# Patient Record
Sex: Male | Born: 1955 | Race: Black or African American | Hispanic: No | Marital: Married | Smoking: Former smoker
Health system: Southern US, Community
[De-identification: ages and names within clinical notes are randomized; demographics above are authoritative.]

## PROBLEM LIST (undated history)

## (undated) DIAGNOSIS — E785 Hyperlipidemia, unspecified: Secondary | ICD-10-CM

## (undated) DIAGNOSIS — C801 Malignant (primary) neoplasm, unspecified: Secondary | ICD-10-CM

## (undated) DIAGNOSIS — M1990 Unspecified osteoarthritis, unspecified site: Secondary | ICD-10-CM

## (undated) DIAGNOSIS — E119 Type 2 diabetes mellitus without complications: Secondary | ICD-10-CM

## (undated) DIAGNOSIS — Z8546 Personal history of malignant neoplasm of prostate: Secondary | ICD-10-CM

## (undated) DIAGNOSIS — R972 Elevated prostate specific antigen [PSA]: Secondary | ICD-10-CM

## (undated) DIAGNOSIS — I10 Essential (primary) hypertension: Secondary | ICD-10-CM

## (undated) HISTORY — PX: JOINT REPLACEMENT: SHX530

## (undated) HISTORY — DX: Personal history of malignant neoplasm of prostate: Z85.46

## (undated) HISTORY — DX: Hyperlipidemia, unspecified: E78.5

## (undated) HISTORY — DX: Elevated prostate specific antigen (PSA): R97.20

---

## 2007-08-16 DIAGNOSIS — C801 Malignant (primary) neoplasm, unspecified: Secondary | ICD-10-CM

## 2007-08-16 HISTORY — DX: Malignant (primary) neoplasm, unspecified: C80.1

## 2008-08-15 HISTORY — PX: HERNIA REPAIR: SHX51

## 2013-08-15 HISTORY — PX: OTHER SURGICAL HISTORY: SHX169

## 2013-12-06 DIAGNOSIS — N529 Male erectile dysfunction, unspecified: Secondary | ICD-10-CM | POA: Insufficient documentation

## 2013-12-06 DIAGNOSIS — Z206 Contact with and (suspected) exposure to human immunodeficiency virus [HIV]: Secondary | ICD-10-CM

## 2013-12-06 DIAGNOSIS — R221 Localized swelling, mass and lump, neck: Secondary | ICD-10-CM | POA: Insufficient documentation

## 2013-12-06 DIAGNOSIS — I10 Essential (primary) hypertension: Secondary | ICD-10-CM | POA: Insufficient documentation

## 2013-12-06 DIAGNOSIS — R51 Headache: Secondary | ICD-10-CM

## 2013-12-06 DIAGNOSIS — A6000 Herpesviral infection of urogenital system, unspecified: Secondary | ICD-10-CM | POA: Insufficient documentation

## 2013-12-23 DIAGNOSIS — K118 Other diseases of salivary glands: Secondary | ICD-10-CM | POA: Insufficient documentation

## 2014-02-06 DIAGNOSIS — C61 Malignant neoplasm of prostate: Secondary | ICD-10-CM | POA: Insufficient documentation

## 2014-02-06 DIAGNOSIS — E1169 Type 2 diabetes mellitus with other specified complication: Secondary | ICD-10-CM | POA: Insufficient documentation

## 2014-02-06 DIAGNOSIS — E1165 Type 2 diabetes mellitus with hyperglycemia: Secondary | ICD-10-CM | POA: Insufficient documentation

## 2014-02-06 DIAGNOSIS — E119 Type 2 diabetes mellitus without complications: Secondary | ICD-10-CM

## 2014-02-06 DIAGNOSIS — Z8546 Personal history of malignant neoplasm of prostate: Secondary | ICD-10-CM

## 2014-09-19 ENCOUNTER — Ambulatory Visit: Payer: Self-pay | Admitting: Family Medicine

## 2014-09-23 ENCOUNTER — Encounter: Payer: Self-pay | Admitting: Family Medicine

## 2014-10-03 ENCOUNTER — Ambulatory Visit: Payer: Self-pay | Admitting: Family Medicine

## 2014-10-14 ENCOUNTER — Encounter
Admit: 2014-10-14 | Disposition: A | Discharge status: Home / Self Care | Payer: Self-pay | Attending: Family Medicine | Admitting: Family Medicine

## 2014-10-31 ENCOUNTER — Ambulatory Visit: Payer: Self-pay | Admitting: Family Medicine

## 2015-08-15 LAB — TSH: TSH: 1.24 (ref 0.41–5.90)

## 2016-01-19 ENCOUNTER — Other Ambulatory Visit: Payer: Self-pay | Admitting: Specialist

## 2016-01-19 DIAGNOSIS — M25552 Pain in left hip: Secondary | ICD-10-CM

## 2016-01-20 ENCOUNTER — Other Ambulatory Visit: Payer: Self-pay | Admitting: Orthopedic Surgery

## 2016-01-20 DIAGNOSIS — M25552 Pain in left hip: Secondary | ICD-10-CM

## 2016-02-10 ENCOUNTER — Ambulatory Visit: Payer: Self-pay

## 2016-02-23 ENCOUNTER — Ambulatory Visit: Payer: Self-pay

## 2016-04-01 ENCOUNTER — Other Ambulatory Visit: Payer: Self-pay | Admitting: Orthopedic Surgery

## 2016-04-01 DIAGNOSIS — M5416 Radiculopathy, lumbar region: Secondary | ICD-10-CM

## 2016-04-13 ENCOUNTER — Ambulatory Visit: Payer: Self-pay

## 2016-04-19 ENCOUNTER — Inpatient Hospital Stay: Admit: 2016-04-19 | Payer: Self-pay | Admitting: Orthopedic Surgery

## 2016-04-19 SURGERY — ARTHROPLASTY, HIP, TOTAL,POSTERIOR APPROACH
Anesthesia: General | Laterality: Left

## 2016-05-18 DIAGNOSIS — M25559 Pain in unspecified hip: Secondary | ICD-10-CM | POA: Insufficient documentation

## 2016-06-06 ENCOUNTER — Other Ambulatory Visit: Payer: Self-pay | Admitting: Orthopedic Surgery

## 2016-06-08 ENCOUNTER — Ambulatory Visit
Admission: RE | Admit: 2016-06-08 | Discharge: 2016-06-08 | Disposition: A | Discharge status: Home / Self Care | Payer: Managed Care, Other (non HMO) | Source: Ambulatory Visit | Attending: Orthopedic Surgery | Admitting: Orthopedic Surgery

## 2016-06-08 ENCOUNTER — Encounter
Admission: RE | Admit: 2016-06-08 | Discharge: 2016-06-08 | Disposition: A | Discharge status: Home / Self Care | Payer: Managed Care, Other (non HMO) | Source: Ambulatory Visit | Attending: Orthopedic Surgery | Admitting: Orthopedic Surgery

## 2016-06-08 DIAGNOSIS — Z01818 Encounter for other preprocedural examination: Secondary | ICD-10-CM | POA: Insufficient documentation

## 2016-06-08 DIAGNOSIS — Z01811 Encounter for preprocedural respiratory examination: Secondary | ICD-10-CM

## 2016-06-08 HISTORY — DX: Malignant (primary) neoplasm, unspecified: C80.1

## 2016-06-08 HISTORY — DX: Type 2 diabetes mellitus without complications: E11.9

## 2016-06-08 HISTORY — DX: Essential (primary) hypertension: I10

## 2016-06-08 HISTORY — DX: Unspecified osteoarthritis, unspecified site: M19.90

## 2016-06-08 LAB — URINALYSIS COMPLETE WITH MICROSCOPIC (ARMC ONLY)
BACTERIA UA: NONE SEEN
Bilirubin Urine: NEGATIVE
GLUCOSE, UA: NEGATIVE mg/dL
Ketones, ur: NEGATIVE mg/dL
Leukocytes, UA: NEGATIVE
Nitrite: NEGATIVE
PROTEIN: NEGATIVE mg/dL
RBC / HPF: NONE SEEN RBC/hpf (ref 0–5)
SPECIFIC GRAVITY, URINE: 1.01 (ref 1.005–1.030)
SQUAMOUS EPITHELIAL / LPF: NONE SEEN
pH: 5 (ref 5.0–8.0)

## 2016-06-08 LAB — SURGICAL PCR SCREEN
MRSA, PCR: NEGATIVE
STAPHYLOCOCCUS AUREUS: NEGATIVE

## 2016-06-08 LAB — CBC WITH DIFFERENTIAL/PLATELET
BASOS ABS: 0 10*3/uL (ref 0–0.1)
BASOS PCT: 1 %
Eosinophils Absolute: 0.1 10*3/uL (ref 0–0.7)
Eosinophils Relative: 2 %
HEMATOCRIT: 40 % (ref 40.0–52.0)
HEMOGLOBIN: 14.1 g/dL (ref 13.0–18.0)
LYMPHS PCT: 14 %
Lymphs Abs: 1 10*3/uL (ref 1.0–3.6)
MCH: 32.3 pg (ref 26.0–34.0)
MCHC: 35.2 g/dL (ref 32.0–36.0)
MCV: 91.8 fL (ref 80.0–100.0)
MONO ABS: 0.6 10*3/uL (ref 0.2–1.0)
MONOS PCT: 8 %
NEUTROS ABS: 5.2 10*3/uL (ref 1.4–6.5)
NEUTROS PCT: 75 %
Platelets: 285 10*3/uL (ref 150–440)
RBC: 4.36 MIL/uL — ABNORMAL LOW (ref 4.40–5.90)
RDW: 12.6 % (ref 11.5–14.5)
WBC: 6.9 10*3/uL (ref 3.8–10.6)

## 2016-06-08 LAB — COMPREHENSIVE METABOLIC PANEL
ALBUMIN: 4 g/dL (ref 3.5–5.0)
ALK PHOS: 46 U/L (ref 38–126)
ALT: 23 U/L (ref 17–63)
AST: 19 U/L (ref 15–41)
Anion gap: 9 (ref 5–15)
BILIRUBIN TOTAL: 0.5 mg/dL (ref 0.3–1.2)
BUN: 13 mg/dL (ref 6–20)
CALCIUM: 9.3 mg/dL (ref 8.9–10.3)
CO2: 25 mmol/L (ref 22–32)
Chloride: 103 mmol/L (ref 101–111)
Creatinine, Ser: 0.93 mg/dL (ref 0.61–1.24)
GFR calc Af Amer: >60 mL/min (ref >60)
GLUCOSE: 116 mg/dL — AB (ref 65–99)
Potassium: 3.9 mmol/L (ref 3.5–5.1)
Sodium: 137 mmol/L (ref 135–145)
TOTAL PROTEIN: 7.4 g/dL (ref 6.5–8.1)

## 2016-06-08 LAB — APTT: aPTT: 26 seconds (ref 24–36)

## 2016-06-08 LAB — PROTIME-INR
INR: 0.88
PROTHROMBIN TIME: 11.9 s (ref 11.4–15.2)

## 2016-06-08 LAB — TYPE AND SCREEN
ABO/RH(D): A POS
ANTIBODY SCREEN: NEGATIVE

## 2016-06-08 NOTE — Patient Instructions (Signed)
  Your procedure is scheduled on: June 09, 2016 (Thursday) Report to Same Day Surgery 2nd floor [facility] ARRIVAL TIME 6:15 AM  Remember: Instructions that are not followed completely may result in serious medical risk, up to and including death, or upon the discretion of your surgeon and anesthesiologist your surgery may need to be rescheduled.    _x___ 1. Do not eat food or drink liquids after midnight. No gum chewing or hard candies.     __x__ 2. No Alcohol for 24 hours before or after surgery.   __x__3. No Smoking for 24 prior to surgery.   ____  4. Bring all medications with you on the day of surgery if instructed.    __x__ 5. Notify your doctor if there is any change in your medical condition     (cold, fever, infections).     Do not wear jewelry, make-up, hairpins, clips or nail polish.  Do not wear lotions, powders, or perfumes. You may wear deodorant.  Do not shave 48 hours prior to surgery. Men may shave face and neck.  Do not bring valuables to the hospital.    [facility] is not responsible for any belongings or valuables.               Contacts, dentures or bridgework may not be worn into surgery.  Leave your suitcase in the car. After surgery it may be brought to your room.  For patients admitted to the hospital, discharge time is determined by your treatment team.   Patients discharged the day of surgery will not be allowed to drive home.    Please read over the following fact sheets that you were given:   [facility] Preparing for Surgery and or MRSA Information   _x___ Take these medicines the morning of surgery with A SIP OF WATER:    1.   2.  3.  4.  5.  6.  ____Fleets enema or Magnesium Citrate as directed.   _x___ Use CHG Soap or sage wipes as directed on instruction sheet   ____ Use inhalers on the day of surgery and bring to hospital day of surgery  __x__ Stop metformin 2 days prior to surgery (STOP METFORMIN NOW)    ____ Take 1/2 of usual  insulin dose the night before surgery and none on the morning of  surgery.           __x__ Stop aspirin or coumadin, or plavix (Patient stopped Aspirin one week ago)  x__ Stop Anti-inflammatories such as Advil, Aleve, Ibuprofen, Motrin, Naproxen,          Naprosyn, Goodies powders,Meloxicam or aspirin products. Ok to take Tylenol, or Tramadol if needed.   _x___ Stop supplements until after surgery.  (Stop Fish Oil today)  ____ Bring C-Pap to the hospital.

## 2016-06-09 ENCOUNTER — Encounter: Payer: Self-pay | Admitting: *Deleted

## 2016-06-09 ENCOUNTER — Inpatient Hospital Stay: Payer: Managed Care, Other (non HMO)

## 2016-06-09 ENCOUNTER — Inpatient Hospital Stay: Payer: Managed Care, Other (non HMO) | Admitting: Registered Nurse

## 2016-06-09 ENCOUNTER — Encounter
Admission: RE | Disposition: A | Discharge status: Home / Self Care | Payer: Self-pay | Source: Ambulatory Visit | Attending: Orthopedic Surgery

## 2016-06-09 ENCOUNTER — Inpatient Hospital Stay
Admission: RE | Admit: 2016-06-09 | Discharge: 2016-06-12 | DRG: 470 | Disposition: A | Discharge status: Home / Self Care | Payer: Managed Care, Other (non HMO) | Source: Ambulatory Visit | Attending: Orthopedic Surgery | Admitting: Orthopedic Surgery

## 2016-06-09 DIAGNOSIS — Z419 Encounter for procedure for purposes other than remedying health state, unspecified: Secondary | ICD-10-CM

## 2016-06-09 DIAGNOSIS — M6281 Muscle weakness (generalized): Secondary | ICD-10-CM

## 2016-06-09 DIAGNOSIS — E119 Type 2 diabetes mellitus without complications: Secondary | ICD-10-CM | POA: Diagnosis present

## 2016-06-09 DIAGNOSIS — Z96642 Presence of left artificial hip joint: Secondary | ICD-10-CM

## 2016-06-09 DIAGNOSIS — Z791 Long term (current) use of non-steroidal anti-inflammatories (NSAID): Secondary | ICD-10-CM

## 2016-06-09 DIAGNOSIS — Z7984 Long term (current) use of oral hypoglycemic drugs: Secondary | ICD-10-CM

## 2016-06-09 DIAGNOSIS — I10 Essential (primary) hypertension: Secondary | ICD-10-CM | POA: Diagnosis present

## 2016-06-09 DIAGNOSIS — Z8546 Personal history of malignant neoplasm of prostate: Secondary | ICD-10-CM

## 2016-06-09 DIAGNOSIS — Z923 Personal history of irradiation: Secondary | ICD-10-CM | POA: Diagnosis not present

## 2016-06-09 DIAGNOSIS — M25552 Pain in left hip: Secondary | ICD-10-CM

## 2016-06-09 DIAGNOSIS — M1612 Unilateral primary osteoarthritis, left hip: Principal | ICD-10-CM | POA: Diagnosis present

## 2016-06-09 DIAGNOSIS — R262 Difficulty in walking, not elsewhere classified: Secondary | ICD-10-CM

## 2016-06-09 HISTORY — PX: TOTAL HIP ARTHROPLASTY: SHX124

## 2016-06-09 LAB — HEMOGLOBIN A1C
HEMOGLOBIN A1C: 5.9 % — AB (ref 4.8–5.6)
MEAN PLASMA GLUCOSE: 123 mg/dL

## 2016-06-09 LAB — GLUCOSE, CAPILLARY
GLUCOSE-CAPILLARY: 152 mg/dL — AB (ref 65–99)
GLUCOSE-CAPILLARY: 173 mg/dL — AB (ref 65–99)

## 2016-06-09 SURGERY — ARTHROPLASTY, HIP, TOTAL,POSTERIOR APPROACH
Anesthesia: General | Site: Hip | Laterality: Left | Wound class: Clean

## 2016-06-09 MED ORDER — BISACODYL 5 MG PO TBEC: 5.0000 mg | DELAYED_RELEASE_TABLET | Freq: Every day | ORAL | Status: DC | PRN

## 2016-06-09 MED ORDER — FENTANYL CITRATE (PF) 100 MCG/2ML IJ SOLN
25.0000 ug | INTRAMUSCULAR | Status: AC | PRN
  Administered 2016-06-09 (×2): 25 ug via INTRAVENOUS

## 2016-06-09 MED ORDER — SUGAMMADEX SODIUM 200 MG/2ML IV SOLN
INTRAVENOUS | Status: DC | PRN
  Administered 2016-06-09: 200 mg via INTRAVENOUS

## 2016-06-09 MED ORDER — METHOCARBAMOL 500 MG PO TABS: 500.0000 mg | ORAL_TABLET | Freq: Four times a day (QID) | ORAL | Status: DC | PRN

## 2016-06-09 MED ORDER — LISINOPRIL-HYDROCHLOROTHIAZIDE 10-12.5 MG PO TABS: 0.5000 | ORAL_TABLET | Freq: Every day | ORAL | Status: DC

## 2016-06-09 MED ORDER — FENTANYL CITRATE (PF) 100 MCG/2ML IJ SOLN
INTRAMUSCULAR | Status: DC | PRN
  Administered 2016-06-09: 100 ug via INTRAVENOUS
  Administered 2016-06-09: 50 ug via INTRAVENOUS
  Administered 2016-06-09 (×2): 25 ug via INTRAVENOUS

## 2016-06-09 MED ORDER — EPHEDRINE SULFATE 50 MG/ML IJ SOLN
INTRAMUSCULAR | Status: DC | PRN
  Administered 2016-06-09 (×2): 5 mg via INTRAVENOUS
  Administered 2016-06-09: 10 mg via INTRAVENOUS
  Administered 2016-06-09: 5 mg via INTRAVENOUS

## 2016-06-09 MED ORDER — ASPIRIN EC 81 MG PO TBEC
81.0000 mg | DELAYED_RELEASE_TABLET | Freq: Every day | ORAL | Status: DC
  Administered 2016-06-10 – 2016-06-12 (×3): 81 mg via ORAL
  Filled 2016-06-09 (×3): qty 1

## 2016-06-09 MED ORDER — SODIUM CHLORIDE 0.9 % IV SOLN
INTRAVENOUS | Status: DC
  Administered 2016-06-09: 12:00:00 via INTRAVENOUS

## 2016-06-09 MED ORDER — CEFAZOLIN SODIUM-DEXTROSE 2-4 GM/100ML-% IV SOLN
INTRAVENOUS | Status: AC
  Filled 2016-06-09: qty 100

## 2016-06-09 MED ORDER — BUPIVACAINE HCL (PF) 0.25 % IJ SOLN
INTRAMUSCULAR | Status: AC
  Filled 2016-06-09: qty 30

## 2016-06-09 MED ORDER — HYDROMORPHONE HCL 1 MG/ML IJ SOLN
1.0000 mg | INTRAMUSCULAR | Status: DC | PRN
  Administered 2016-06-09 – 2016-06-11 (×4): 1 mg via INTRAVENOUS
  Filled 2016-06-09 (×4): qty 1

## 2016-06-09 MED ORDER — ONDANSETRON HCL 4 MG/2ML IJ SOLN
INTRAMUSCULAR | Status: DC | PRN
  Administered 2016-06-09: 4 mg via INTRAVENOUS

## 2016-06-09 MED ORDER — PHENYLEPHRINE HCL 10 MG/ML IJ SOLN
INTRAMUSCULAR | Status: DC | PRN
  Administered 2016-06-09 (×4): 50 ug via INTRAVENOUS
  Administered 2016-06-09: 100 ug via INTRAVENOUS
  Administered 2016-06-09: 50 ug via INTRAVENOUS
  Administered 2016-06-09: 100 ug via INTRAVENOUS
  Administered 2016-06-09: 50 ug via INTRAVENOUS

## 2016-06-09 MED ORDER — LIDOCAINE HCL (CARDIAC) 20 MG/ML IV SOLN
INTRAVENOUS | Status: DC | PRN
  Administered 2016-06-09: 80 mg via INTRAVENOUS

## 2016-06-09 MED ORDER — CLINDAMYCIN PHOSPHATE 600 MG/50ML IV SOLN
600.0000 mg | Freq: Three times a day (TID) | INTRAVENOUS | Status: AC
  Administered 2016-06-09 (×2): 600 mg via INTRAVENOUS
  Filled 2016-06-09 (×2): qty 50

## 2016-06-09 MED ORDER — FENTANYL CITRATE (PF) 100 MCG/2ML IJ SOLN
INTRAMUSCULAR | Status: AC
  Administered 2016-06-09: 25 ug via INTRAVENOUS
  Filled 2016-06-09: qty 2

## 2016-06-09 MED ORDER — KETOROLAC TROMETHAMINE 15 MG/ML IJ SOLN
15.0000 mg | Freq: Four times a day (QID) | INTRAMUSCULAR | Status: AC
  Administered 2016-06-09 – 2016-06-10 (×5): 15 mg via INTRAVENOUS
  Filled 2016-06-09 (×6): qty 1

## 2016-06-09 MED ORDER — ONDANSETRON HCL 4 MG PO TABS
4.0000 mg | ORAL_TABLET | Freq: Four times a day (QID) | ORAL | Status: DC | PRN
  Administered 2016-06-12: 4 mg via ORAL
  Filled 2016-06-09: qty 1

## 2016-06-09 MED ORDER — ACETAMINOPHEN 325 MG PO TABS: 650.0000 mg | ORAL_TABLET | Freq: Four times a day (QID) | ORAL | Status: DC | PRN

## 2016-06-09 MED ORDER — CEFAZOLIN SODIUM-DEXTROSE 2-4 GM/100ML-% IV SOLN
2.0000 g | INTRAVENOUS | Status: AC
  Administered 2016-06-09: 2 g via INTRAVENOUS

## 2016-06-09 MED ORDER — PHENOL 1.4 % MT LIQD
1.0000 | OROMUCOSAL | Status: DC | PRN
  Filled 2016-06-09: qty 177

## 2016-06-09 MED ORDER — ONDANSETRON HCL 4 MG/2ML IJ SOLN: 4.0000 mg | Freq: Four times a day (QID) | INTRAMUSCULAR | Status: DC | PRN

## 2016-06-09 MED ORDER — CLINDAMYCIN PHOSPHATE 600 MG/50ML IV SOLN
600.0000 mg | Freq: Once | INTRAVENOUS | Status: AC
  Administered 2016-06-09: 600 mg via INTRAVENOUS

## 2016-06-09 MED ORDER — FAMOTIDINE 20 MG PO TABS
20.0000 mg | ORAL_TABLET | Freq: Once | ORAL | Status: AC
  Administered 2016-06-09: 20 mg via ORAL

## 2016-06-09 MED ORDER — DOCUSATE SODIUM 100 MG PO CAPS
100.0000 mg | ORAL_CAPSULE | Freq: Two times a day (BID) | ORAL | Status: DC
  Administered 2016-06-09 – 2016-06-12 (×6): 100 mg via ORAL
  Filled 2016-06-09 (×6): qty 1

## 2016-06-09 MED ORDER — ALUM & MAG HYDROXIDE-SIMETH 200-200-20 MG/5ML PO SUSP: 30.0000 mL | ORAL | Status: DC | PRN

## 2016-06-09 MED ORDER — CEFAZOLIN IN D5W 1 GM/50ML IV SOLN
1.0000 g | Freq: Four times a day (QID) | INTRAVENOUS | Status: AC
  Administered 2016-06-09 (×2): 1 g via INTRAVENOUS
  Filled 2016-06-09 (×2): qty 50

## 2016-06-09 MED ORDER — ACETAMINOPHEN 10 MG/ML IV SOLN
INTRAVENOUS | Status: DC | PRN
  Administered 2016-06-09: 1000 mg via INTRAVENOUS

## 2016-06-09 MED ORDER — OXYCODONE HCL 5 MG PO TABS
10.0000 mg | ORAL_TABLET | ORAL | Status: DC | PRN
  Administered 2016-06-09 – 2016-06-10 (×4): 10 mg via ORAL
  Administered 2016-06-11 – 2016-06-12 (×8): 15 mg via ORAL
  Filled 2016-06-09: qty 2
  Filled 2016-06-09 (×3): qty 3
  Filled 2016-06-09: qty 2
  Filled 2016-06-09 (×3): qty 3
  Filled 2016-06-09: qty 2
  Filled 2016-06-09 (×2): qty 3
  Filled 2016-06-09: qty 2

## 2016-06-09 MED ORDER — NEOMYCIN-POLYMYXIN B GU 40-200000 IR SOLN
Status: AC
  Filled 2016-06-09: qty 20

## 2016-06-09 MED ORDER — METFORMIN HCL 500 MG PO TABS
1000.0000 mg | ORAL_TABLET | Freq: Two times a day (BID) | ORAL | Status: DC
  Administered 2016-06-10 – 2016-06-12 (×5): 1000 mg via ORAL
  Filled 2016-06-09 (×5): qty 2

## 2016-06-09 MED ORDER — FAMOTIDINE 20 MG PO TABS
ORAL_TABLET | ORAL | Status: AC
  Administered 2016-06-09: 20 mg via ORAL
  Filled 2016-06-09: qty 1

## 2016-06-09 MED ORDER — CELECOXIB 200 MG PO CAPS: 200.0000 mg | ORAL_CAPSULE | Freq: Two times a day (BID) | ORAL | Status: DC

## 2016-06-09 MED ORDER — TAMSULOSIN HCL 0.4 MG PO CAPS
0.4000 mg | ORAL_CAPSULE | Freq: Every day | ORAL | Status: DC
  Administered 2016-06-10 – 2016-06-11 (×2): 0.4 mg via ORAL
  Filled 2016-06-09 (×2): qty 1

## 2016-06-09 MED ORDER — SODIUM CHLORIDE 0.9 % IV SOLN
INTRAVENOUS | Status: DC
  Administered 2016-06-09 (×2): via INTRAVENOUS

## 2016-06-09 MED ORDER — ENOXAPARIN SODIUM 30 MG/0.3ML ~~LOC~~ SOLN
30.0000 mg | Freq: Two times a day (BID) | SUBCUTANEOUS | Status: DC
  Administered 2016-06-10 – 2016-06-12 (×5): 30 mg via SUBCUTANEOUS
  Filled 2016-06-09 (×5): qty 0.3

## 2016-06-09 MED ORDER — MIDAZOLAM HCL 2 MG/2ML IJ SOLN
INTRAMUSCULAR | Status: DC | PRN
  Administered 2016-06-09: 2 mg via INTRAVENOUS

## 2016-06-09 MED ORDER — ROCURONIUM BROMIDE 100 MG/10ML IV SOLN
INTRAVENOUS | Status: DC | PRN
  Administered 2016-06-09 (×2): 10 mg via INTRAVENOUS
  Administered 2016-06-09: 50 mg via INTRAVENOUS

## 2016-06-09 MED ORDER — NEOMYCIN-POLYMYXIN B GU 40-200000 IR SOLN
Status: DC | PRN
  Administered 2016-06-09: 16 mL

## 2016-06-09 MED ORDER — CHLORHEXIDINE GLUCONATE 4 % EX LIQD: 60.0000 mL | Freq: Once | CUTANEOUS | Status: DC

## 2016-06-09 MED ORDER — ACETAMINOPHEN 10 MG/ML IV SOLN
INTRAVENOUS | Status: AC
  Filled 2016-06-09: qty 100

## 2016-06-09 MED ORDER — FLEET ENEMA 7-19 GM/118ML RE ENEM: 1.0000 | ENEMA | Freq: Once | RECTAL | Status: DC | PRN

## 2016-06-09 MED ORDER — PROPOFOL 10 MG/ML IV BOLUS
INTRAVENOUS | Status: DC | PRN
  Administered 2016-06-09: 170 mg via INTRAVENOUS

## 2016-06-09 MED ORDER — ACETAMINOPHEN 500 MG PO TABS
1000.0000 mg | ORAL_TABLET | Freq: Four times a day (QID) | ORAL | Status: DC | PRN
Start: 2016-06-09 — End: 2016-06-12

## 2016-06-09 MED ORDER — GABAPENTIN 300 MG PO CAPS
300.0000 mg | ORAL_CAPSULE | Freq: Three times a day (TID) | ORAL | Status: DC
  Administered 2016-06-09 – 2016-06-12 (×8): 300 mg via ORAL
  Filled 2016-06-09 (×8): qty 1

## 2016-06-09 MED ORDER — MENTHOL 3 MG MT LOZG
1.0000 | LOZENGE | OROMUCOSAL | Status: DC | PRN
  Filled 2016-06-09: qty 9

## 2016-06-09 MED ORDER — ACETAMINOPHEN 650 MG RE SUPP: 650.0000 mg | Freq: Four times a day (QID) | RECTAL | Status: DC | PRN

## 2016-06-09 MED ORDER — CLINDAMYCIN PHOSPHATE 600 MG/50ML IV SOLN
INTRAVENOUS | Status: AC
  Filled 2016-06-09: qty 50

## 2016-06-09 MED ORDER — DIPHENHYDRAMINE HCL 12.5 MG/5ML PO ELIX: 12.5000 mg | ORAL_SOLUTION | ORAL | Status: DC | PRN

## 2016-06-09 MED ORDER — FENTANYL CITRATE (PF) 100 MCG/2ML IJ SOLN
25.0000 ug | INTRAMUSCULAR | Status: AC | PRN
  Administered 2016-06-09 (×6): 25 ug via INTRAVENOUS

## 2016-06-09 MED ORDER — MAGNESIUM HYDROXIDE 400 MG/5ML PO SUSP
30.0000 mL | Freq: Every day | ORAL | Status: DC | PRN
  Administered 2016-06-10 – 2016-06-11 (×2): 30 mL via ORAL
  Filled 2016-06-09 (×2): qty 30

## 2016-06-09 MED ORDER — ONDANSETRON HCL 4 MG/2ML IJ SOLN: 4.0000 mg | Freq: Once | INTRAMUSCULAR | Status: DC | PRN

## 2016-06-09 SURGICAL SUPPLY — 59 items
BLADE DEBAKEY 8.0 (BLADE) ×2 IMPLANT
BLADE DEBAKEY 8.0MM (BLADE) ×1
BLADE SAGITTAL WIDE XTHICK NO (BLADE) ×3 IMPLANT
BLADE SURG SZ10 CARB STEEL (BLADE) ×3 IMPLANT
CANISTER SUCT 1200ML W/VALVE (MISCELLANEOUS) ×3 IMPLANT
CANISTER SUCT 3000ML (MISCELLANEOUS) ×3 IMPLANT
CAPT HIP TOTAL 2 ×3 IMPLANT
CATH TRAY METER 16FR LF (MISCELLANEOUS) ×3 IMPLANT
CLOSURE WOUND 1/2 X4 (GAUZE/BANDAGES/DRESSINGS) ×2
DRAPE IMP U-DRAPE 54X76 (DRAPES) ×3 IMPLANT
DRAPE INCISE IOBAN 66X60 STRL (DRAPES) ×3 IMPLANT
DRAPE SHEET LG 3/4 BI-LAMINATE (DRAPES) ×6 IMPLANT
DRAPE SURG 17X11 SM STRL (DRAPES) ×3 IMPLANT
DRAPE TABLE BACK 80X90 (DRAPES) ×3 IMPLANT
DRSG OPSITE POSTOP 4X10 (GAUZE/BANDAGES/DRESSINGS) ×3 IMPLANT
DURAPREP 26ML APPLICATOR (WOUND CARE) ×9 IMPLANT
ELECT BLADE 6.5 EXT (BLADE) ×3 IMPLANT
ELECT CAUTERY BLADE 6.4 (BLADE) ×3 IMPLANT
ELECT REM PT RETURN 9FT ADLT (ELECTROSURGICAL) ×3
ELECTRODE REM PT RTRN 9FT ADLT (ELECTROSURGICAL) ×1 IMPLANT
GAUZE PETRO XEROFOAM 1X8 (MISCELLANEOUS) ×9 IMPLANT
GAUZE SPONGE 4X4 12PLY STRL (GAUZE/BANDAGES/DRESSINGS) ×3 IMPLANT
GLOVE BIOGEL PI IND STRL 9 (GLOVE) ×1 IMPLANT
GLOVE BIOGEL PI INDICATOR 9 (GLOVE) ×2
GLOVE SURG 9.0 ORTHO LTXF (GLOVE) ×6 IMPLANT
GOWN STRL REUS TWL 2XL XL LVL4 (GOWN DISPOSABLE) ×3 IMPLANT
GOWN STRL REUS W/ TWL LRG LVL3 (GOWN DISPOSABLE) ×1 IMPLANT
GOWN STRL REUS W/TWL LRG LVL3 (GOWN DISPOSABLE) ×2
NEEDLE FILTER BLUNT 18X 1/2SAF (NEEDLE) ×2
NEEDLE FILTER BLUNT 18X1 1/2 (NEEDLE) ×1 IMPLANT
NEEDLE HYPO 22GX1.5 SAFETY (NEEDLE) ×3 IMPLANT
NEEDLE MAYO CATGUT SZ4 (NEEDLE) ×3 IMPLANT
NS IRRIG 1000ML POUR BTL (IV SOLUTION) ×3 IMPLANT
PACK HIP PROSTHESIS (MISCELLANEOUS) ×3 IMPLANT
PILLOW ABDUC SM (MISCELLANEOUS) ×3 IMPLANT
RETRIEVER SUT HEWSON (MISCELLANEOUS) IMPLANT
SOL .9 NS 3000ML IRR  AL (IV SOLUTION) ×2
SOL .9 NS 3000ML IRR UROMATIC (IV SOLUTION) ×1 IMPLANT
SPONGE LAP 18X18 5 PK (GAUZE/BANDAGES/DRESSINGS) IMPLANT
STAPLER SKIN PROX 35W (STAPLE) ×3 IMPLANT
STRAP SAFETY BODY (MISCELLANEOUS) ×3 IMPLANT
STRIP CLOSURE SKIN 1/2X4 (GAUZE/BANDAGES/DRESSINGS) ×4 IMPLANT
SUT BONE WAX W31G (SUTURE) ×3 IMPLANT
SUT ETHIBOND NAB CT1 #1 30IN (SUTURE) ×9 IMPLANT
SUT MNCRL 3 0 RB1 (SUTURE) ×1 IMPLANT
SUT MNCRL AB 3-0 PS2 27 (SUTURE) ×9 IMPLANT
SUT MONOCRYL 3 0 RB1 (SUTURE) ×2
SUT TICRON 2-0 30IN 311381 (SUTURE) ×15 IMPLANT
SUT VIC AB 0 CT1 36 (SUTURE) ×6 IMPLANT
SUT VIC AB 2-0 CT1 27 (SUTURE) ×4
SUT VIC AB 2-0 CT1 TAPERPNT 27 (SUTURE) ×2 IMPLANT
SUT VIC AB 2-0 CT2 27 (SUTURE) ×9 IMPLANT
SYR 20CC LL (SYRINGE) ×3 IMPLANT
SYR 30ML LL (SYRINGE) ×3 IMPLANT
SYS IRRIGATION SURGILAV PLUS (MISCELLANEOUS) ×3 IMPLANT
TAPE MICROFOAM 4IN (TAPE) ×3 IMPLANT
TAPE TRANSPORE STRL 2 31045 (GAUZE/BANDAGES/DRESSINGS) ×3 IMPLANT
TUBE SUCT KAM VAC (TUBING) ×3 IMPLANT
WATER STERILE IRR 1000ML POUR (IV SOLUTION) ×3 IMPLANT

## 2016-06-09 NOTE — H&P (Signed)
The patient has been re-examined, and the chart reviewed, and there have been no interval changes to the documented history and physical.    The risks, benefits, and alternatives have been discussed at length, and the patient is willing to proceed.   

## 2016-06-09 NOTE — Anesthesia Procedure Notes (Signed)
Procedure Name: Intubation Date/Time: 06/09/2016 7:48 AM Performed by: Hedda Slade Pre-anesthesia Checklist: Patient identified, Emergency Drugs available, Suction available, Patient being monitored and Timeout performed Patient Re-evaluated:Patient Re-evaluated prior to inductionOxygen Delivery Method: Circle system utilized Preoxygenation: Pre-oxygenation with 100% oxygen Intubation Type: IV induction Ventilation: Mask ventilation without difficulty Laryngoscope Size: Mac and 4 Grade View: Grade I Tube type: Oral Tube size: 7.5 mm Number of attempts: 1 Airway Equipment and Method: Stylet Placement Confirmation: ETT inserted through vocal cords under direct vision Secured at: 24 cm Tube secured with: Tape Dental Injury: Teeth and Oropharynx as per pre-operative assessment

## 2016-06-09 NOTE — Progress Notes (Signed)
Subjective:  POST-OP CHECK:  Patient up out of bed to a chair. His wife is at the bedside. Patient reports right hip pain as marked.  He denies chest pain or shortness of breath. Patient has tolerated a liquid diet.  Objective:   VITALS:   Vitals:   06/09/16 1141 06/09/16 1157 06/09/16 1325 06/09/16 1412  BP: 130/81 133/85 123/75 111/80  Pulse: 86 83 92 93  Resp: 14 18 18 18   Temp: 98.1 F (36.7 C) 97.7 F (36.5 C) 97.7 F (36.5 C) 98.1 F (36.7 C)  TempSrc:  Oral Oral Oral  SpO2: 98% 97% 98% 100%  Weight:      Height:        PHYSICAL EXAM:  Left hip: Patient has moderate sanguinous drainage on his dressing.  His thigh compartments are soft and compressible.  There is no erythema or ecchymosis or significant swelling.  He can dorsiflex and plantarflex his ankle and flex and extend his toes. He has palpable pedal pulses and intact sensation light touch.  LABS  Results for orders placed or performed during the hospital encounter of 06/09/16 (from the past 24 hour(s))  Glucose, capillary     Status: Abnormal   Collection Time: 06/09/16  6:13 AM  Result Value Ref Range   Glucose-Capillary 152 (H) 65 - 99 mg/dL  Glucose, capillary     Status: Abnormal   Collection Time: 06/09/16 10:47 AM  Result Value Ref Range   Glucose-Capillary 173 (H) 65 - 99 mg/dL    Chest 2 View  Result Date: 06/08/2016 CLINICAL DATA:  Hip surgery. EXAM: CHEST  2 VIEW COMPARISON:  No prior . FINDINGS: Mediastinum and hilar structures normal. Cardiomegaly with normal pulmonary vascularity. No focal pulmonary infiltrate or pleural effusion. Degenerative changes thoracic spine. IMPRESSION: No acute cardiopulmonary disease. Electronically Signed   By: Marcello Moores  Register   On: 06/08/2016 14:09   Dg Pelvis Portable  Result Date: 06/09/2016 CLINICAL DATA:  Left hip replacement EXAM: PORTABLE PELVIS 1-2 VIEWS COMPARISON:  None. FINDINGS: Left hip prosthesis is noted in satisfactory position. No acute bony  abnormality is seen. No soft tissue changes are noted. IMPRESSION: Status post left hip replacement Electronically Signed   By: Inez Catalina M.D.   On: 06/09/2016 09:39   Dg Hip Port Unilat With Pelvis 1v Left  Result Date: 06/09/2016 CLINICAL DATA:  Status post left hip replacement EXAM: DG HIP (WITH OR WITHOUT PELVIS) 2V PORT LEFT COMPARISON:  None. FINDINGS: Left hip prosthesis is noted in satisfactory position. No acute bony abnormality or soft tissue abnormality is seen. IMPRESSION: No acute abnormality noted. Electronically Signed   By: Inez Catalina M.D.   On: 06/09/2016 11:21    Assessment/Plan: Day of Surgery   Active Problems:   Status post total hip replacement, left  Patient is doing well postop.  He has left hip pain as his primary complaint and him ordering Toradol in addition to his oxycodone and Dilaudid. I'm also ordering Tylenol 1000 mg to be given every 6 hours for pain control. Celebrex was discontinued. He will start physical and occupational therapy tomorrow. Patient will start Lovenox for DVT prophylaxis in addition to his TED stockings and foot pumps. Patient's Foley catheter will be DC'd tomorrow. Patient will have labs drawn in the morning. He'll complete 24 hours of postop antibiotics. I reviewed his postoperative x-rays which demonstrate the hip prosthesis is well-positioned and shows no evidence of fracture dislocation or loosening.    Thornton Park , MD 06/09/2016,  6:06 PM

## 2016-06-09 NOTE — Anesthesia Procedure Notes (Deleted)
Performed by: Hedda Slade

## 2016-06-09 NOTE — Anesthesia Preprocedure Evaluation (Signed)
Anesthesia Evaluation  Patient identified by MRN, date of birth, ID band Patient awake    Reviewed: Allergy & Precautions, H&P , NPO status , Patient's Chart, lab work & pertinent test results, reviewed documented beta blocker date and time   Airway Mallampati: II  TM Distance: >3 FB Neck ROM: full    Dental  (+) Teeth Intact, Poor Dentition   Pulmonary neg pulmonary ROS, former smoker,    Pulmonary exam normal        Cardiovascular hypertension, negative cardio ROS Normal cardiovascular exam Rhythm:regular Rate:Normal     Neuro/Psych negative neurological ROS  negative psych ROS   GI/Hepatic negative GI ROS, Neg liver ROS,   Endo/Other  negative endocrine ROSdiabetes  Renal/GU negative Renal ROS  negative genitourinary   Musculoskeletal   Abdominal   Peds  Hematology negative hematology ROS (+)   Anesthesia Other Findings Past Medical History: No date: Arthritis 2009: Cancer ([place])     Comment: Prostate, radiation but no surgery No date: Diabetes mellitus without complication ([place]) No date: Hypertension Past Surgical History: 2010: HERNIA REPAIR     Comment: Umbilical [id number]: Removal Fatty Tumor Right     Comment: [place], right side of chin/jaw BMI    Body Mass Index:  34.06 kg/m     Reproductive/Obstetrics negative OB ROS                             Anesthesia Physical Anesthesia Plan  ASA: II  Anesthesia Plan: General ETT   Post-op Pain Management:    Induction:   Airway Management Planned:   Additional Equipment:   Intra-op Plan:   Post-operative Plan:   Informed Consent: I have reviewed the patients History and Physical, chart, labs and discussed the procedure including the risks, benefits and alternatives for the proposed anesthesia with the patient or authorized representative who has indicated his/her understanding and acceptance.   Dental  Advisory Given  Plan Discussed with: CRNA  Anesthesia Plan Comments:         Anesthesia Quick Evaluation

## 2016-06-09 NOTE — NC FL2 (Signed)
[place] LEVEL OF CARE SCREENING TOOL     IDENTIFICATION  Patient Name: Duane Grant Birthdate: 04-20-1956 Sex: male Admission Date (Current Location): 06/09/2016  [place] and [place] Number:  Engineering geologist and Address:  [facility], [address], [place], [place] [address]      Provider Number: [id number]  Attending Physician Name and Address:  Thornton Park, MD  Relative Name and Phone Number:       Current Level of Care: Hospital Recommended Level of Care: [place] Prior Approval Number:    Date Approved/Denied:   PASRR Number:  [id number]  Discharge Plan: SNF    Current Diagnoses: Patient Active Problem List   Diagnosis Date Noted  . Status post total hip replacement, left 06/09/2016    Orientation RESPIRATION BLADDER Height & Weight     Self, Time, Situation, Place  Normal External catheter Weight: 224 lb (101.6 kg) Height:  5\' 8"  (172.7 cm)  BEHAVIORAL SYMPTOMS/MOOD NEUROLOGICAL BOWEL NUTRITION STATUS   (None.)  (None. ) Continent Diet (Diet: Clear liquid)  AMBULATORY STATUS COMMUNICATION OF NEEDS Skin   Extensive Assist Verbally Surgical wounds (Incision: Left hip)                       Personal Care Assistance Level of Assistance  Bathing, Feeding, Dressing Bathing Assistance: Limited assistance Feeding assistance: Independent Dressing Assistance: Limited assistance     Functional Limitations Info  Sight, Hearing, Speech Sight Info: Adequate Hearing Info: Adequate Speech Info: Adequate    SPECIAL CARE FACTORS FREQUENCY  PT (By licensed PT), OT (By licensed OT)     PT Frequency:  (5) OT Frequency:  (5)            Contractures      Additional Factors Info  Code Status, Allergies Code Status Info:  (Full Code) Allergies Info:  (No Known Allergies)           Current Medications (06/09/2016):  This is the current hospital active medication list Current  Facility-Administered Medications  Medication Dose Route Frequency Provider Last Rate Last Dose  . 0.9 %  sodium chloride infusion   Intravenous Continuous Thornton Park, MD 75 mL/hr at 06/09/16 1200    . acetaminophen (TYLENOL) tablet 650 mg  650 mg Oral Q6H PRN Thornton Park, MD       Or  . acetaminophen (TYLENOL) suppository 650 mg  650 mg Rectal Q6H PRN Thornton Park, MD      . alum & mag hydroxide-simeth (MAALOX/MYLANTA) 200-200-20 MG/5ML suspension 30 mL  30 mL Oral Q4H PRN Thornton Park, MD      . aspirin EC tablet 81 mg  81 mg Oral Daily Thornton Park, MD      . bisacodyl (DULCOLAX) EC tablet 5 mg  5 mg Oral Daily PRN Thornton Park, MD      . ceFAZolin (ANCEF) IVPB 1 g/50 mL premix  1 g Intravenous Q6H Thornton Park, MD      . celecoxib (CELEBREX) capsule 200 mg  200 mg Oral Q12H Thornton Park, MD      . clindamycin (CLEOCIN) IVPB 600 mg  600 mg Intravenous Q8H Thornton Park, MD      . diphenhydrAMINE (BENADRYL) 12.5 MG/5ML elixir 12.5-25 mg  12.5-25 mg Oral Q4H PRN Thornton Park, MD      . docusate sodium (COLACE) capsule 100 mg  100 mg Oral BID Thornton Park, MD      . Derrill Memo  ON 06/10/2016] enoxaparin (LOVENOX) injection 30 mg  30 mg Subcutaneous Q12H Thornton Park, MD      . gabapentin (NEURONTIN) capsule 300 mg  300 mg Oral TID Thornton Park, MD      . HYDROmorphone (DILAUDID) injection 1 mg  1 mg Intravenous Q2H PRN Thornton Park, MD   1 mg at 06/09/16 1308  . magnesium hydroxide (MILK OF MAGNESIA) suspension 30 mL  30 mL Oral Daily PRN Thornton Park, MD      . menthol-cetylpyridinium (CEPACOL) lozenge 3 mg  1 lozenge Oral PRN Thornton Park, MD       Or  . phenol (CHLORASEPTIC) mouth spray 1 spray  1 spray Mouth/Throat PRN Thornton Park, MD      . metFORMIN (GLUCOPHAGE) tablet 1,000 mg  1,000 mg Oral BID WC Thornton Park, MD      . methocarbamol (ROBAXIN) tablet 500 mg  500 mg Oral Q6H PRN Thornton Park, MD      . ondansetron [facility]) tablet 4  mg  4 mg Oral Q6H PRN Thornton Park, MD       Or  . ondansetron [facility]) injection 4 mg  4 mg Intravenous Q6H PRN Thornton Park, MD      . oxyCODONE (Oxy IR/ROXICODONE) immediate release tablet 10-15 mg  10-15 mg Oral Q3H PRN Thornton Park, MD      . sodium phosphate (FLEET) 7-19 GM/118ML enema 1 enema  1 enema Rectal Once PRN Thornton Park, MD      . tamsulosin [facility]) capsule 0.4 mg  0.4 mg Oral QPC supper Thornton Park, MD         Discharge Medications: Please see discharge summary for a list of discharge medications.  Relevant Imaging Results:  Relevant Lab Results:   Additional Information  (SSN: [id number])  Danie Chandler, Student-Social Work

## 2016-06-09 NOTE — Transfer of Care (Signed)
Immediate Anesthesia Transfer of Care Note  Patient: Duane Grant  Procedure(s) Performed: Procedure(s): TOTAL HIP ARTHROPLASTY (Left)  Patient Location: PACU  Anesthesia Type:General  Level of Consciousness: awake and alert   Airway & Oxygen Therapy: Patient Spontanous Breathing  Post-op Assessment: Report given to RN  Post vital signs: Reviewed and stable  Last Vitals:  Vitals:   06/09/16 0626 06/09/16 1041  BP: 131/74 117/68  Pulse: 74 72  Resp: 17 18  Temp: 36.1 C 36.3 C    Last Pain:  Vitals:   06/09/16 0626  TempSrc: Oral  PainSc: 8       Patients Stated Pain Goal: 1 ([id number])  Complications: No apparent anesthesia complications

## 2016-06-09 NOTE — Evaluation (Signed)
Physical Therapy Evaluation Patient Details Name: Duane Grant MRN: [id number] DOB: [birth year] Today's Date: 06/09/2016   History of Present Illness  Pt admitted for L THR.   Clinical Impression  Pt is a pleasant 60 year old male who was admitted for L THR. Pt performs bed mobility with min assist and transfers/ambulation with cga and RW. Pt educated on hip precautions as well as WB status. Pt demonstrates deficits with strength/pain/mobility. Pt reports nausea during mobility with active vomiting. RN aware. Would benefit from skilled PT to address above deficits and promote optimal return to PLOF. Recommend transition to [place] upon discharge from acute hospitalization.       Follow Up Recommendations Home health PT    Equipment Recommendations  Rolling walker with 5" wheels    Recommendations for Other Services       Precautions / Restrictions Precautions Precautions: Fall Restrictions Weight Bearing Restrictions: Yes LLE Weight Bearing: Weight bearing as tolerated      Mobility  Bed Mobility Overal bed mobility: Needs Assistance Bed Mobility: Supine to Sit     Supine to sit: Min assist     General bed mobility comments: assist for sequencing for safe technique. Assist for sliding L LE across bed. Once seated at EOB, pt became nauseous and vomited. RN notified.  Transfers Overall transfer level: Needs assistance Equipment used: Rolling walker (2 wheeled) Transfers: Sit to/from Stand Sit to Stand: Min guard         General transfer comment: transfers performed with RW and safe technique. Pt cued for pushing from seated surface. Once standing, pt able to stand with supervision  Ambulation/Gait Ambulation/Gait assistance: Min guard Ambulation Distance (Feet): 3 Feet Assistive device: Rolling walker (2 wheeled) Gait Pattern/deviations: Step-to pattern     General Gait Details: ambulated using RW and slow step to gait pattern. Pt very cautious with stepping and  demonstrates antalgic gait pattern. Increased pain noted with WBing.  Stairs            Wheelchair Mobility    Modified Rankin (Stroke Patients Only)       Balance Overall balance assessment: Needs assistance Sitting-balance support: Feet supported;Bilateral upper extremity supported Sitting balance-Leahy Scale: Good     Standing balance support: Bilateral upper extremity supported Standing balance-Leahy Scale: Good                               Pertinent Vitals/Pain Pain Assessment: 0-10 Pain Score: 8  Pain Location: L hip Pain Descriptors / Indicators: Operative site guarding;Dull;Discomfort Pain Intervention(s): Limited activity within patient's tolerance;Ice applied;Premedicated before session    [place] expects to be discharged to:: Private residence Living Arrangements: Spouse/significant other Available Help at Discharge: Family;Available 24 hours/day Type of Home: Apartment Home Access: Stairs to enter Entrance Stairs-Rails: Can reach both Entrance Stairs-Number of Steps: [place]: One level Home Equipment: Walker - 4 wheels;Cane - single point;Crutches      Prior Function Level of Independence: Independent with assistive device(s)         Comments: uses SPC primarily for mobility     Hand Dominance        Extremity/Trunk Assessment   Upper Extremity Assessment: Overall WFL for tasks assessed           Lower Extremity Assessment: Generalized weakness (L LE grossly 3/5; R LE grossly 5/5)         Communication   Communication: No difficulties  Cognition  Arousal/Alertness: Awake/alert Behavior During Therapy: WFL for tasks assessed/performed Overall Cognitive Status: Within Functional Limits for tasks assessed                      General Comments      Exercises Other Exercises Other Exercises: Supine ther-ex performed including L LE ankle pumps, quad sets, and hip abd/add. ALl  ther-ex performed x 10 reps with min assist.   Assessment/Plan    PT Assessment Patient needs continued PT services  PT Problem List Decreased strength;Decreased balance;Decreased mobility;Pain          PT Treatment Interventions DME instruction;Gait training;Therapeutic exercise    PT Goals (Current goals can be found in the Care Plan section)  Acute Rehab PT Goals Patient Stated Goal: to go home PT Goal Formulation: With patient Time For Goal Achievement: 06/23/16 Potential to Achieve Goals: Good    Frequency BID   Barriers to discharge        Co-evaluation               End of Session Equipment Utilized During Treatment: Gait belt Activity Tolerance: Patient limited by pain Patient left: in chair;with chair alarm set Nurse Communication: Mobility status         Time: [id number] PT Time Calculation (min) (ACUTE ONLY): 42 min   Charges:   PT Evaluation $PT Eval Moderate Complexity: 1 Procedure PT Treatments $Therapeutic Exercise: 8-22 mins   PT G Codes:        Keevon Henney 07/02/16, 5:17 PM  Greggory Stallion, PT, DPT [phone]

## 2016-06-09 NOTE — Op Note (Signed)
06/09/2016  11:15 AM  PATIENT:  Duane Grant   MRN: [id number]  PRE-OPERATIVE DIAGNOSIS: Unilateral primary osteoarthritis, left hip  POST-OPERATIVE DIAGNOSIS:   Unilateral primary osteoarthritis, left hip   PROCEDURE:  Procedure(s): LEFT TOTAL HIP ARTHROPLASTY  PREOPERATIVE INDICATIONS:    Duane Grant is an 60 y.o. male who has a diagnosis of left hip osteoarthritis and elected for surgical management after failing conservative treatment including physical therapy.  The risks and benefits and alternatives were discussed with the patient including but not limited to the risks including infection requiring removal of the prosthesis, bleeding requiring blood transfusion, nerve injury especially to the sciatic nerve leading to foot drop or lower extremity numbness, periprosthetic fracture, dislocation leg length discrepancy, change in lower extremity rotation persistent hip pain, loosening or failure of the components and the need for revision surgery. Medical risks include but are not limited to DVT and pulmonary wasn't, myocardial infarction, stroke, pneumonia, respiratory failure and death.  OPERATIVE REPORT     SURGEON:  Juanell Fairly, MD    ASSISTANT:  Deeann Saint, MD    ANESTHESIA:  General and local with 0.25% marcaine plain    COMPLICATIONS:  None.   SPECIMEN: Femoral head to pathology    COMPONENTS:  Stryker Accolade 2 127 degree neck angle femoral component size 8 with a 36 mm +0 head ball and a Stryker Trident PSL cluster holed acetabular shell size 56 with a Trident X3 0 degree polyethylene liner, dome hole eliminator and a 16mm Torx screw.     PROCEDURE:   The patient was met in the holding area and  identified.  The left hip was identified and marked at the operative site after verbally confirming with the patient that this was the correct site of surgery.  The patient was then transported to the OR  and underwent general anesthesia.  The patient was then placed  in the lateral decubitus position with the operative side up and secured on the operating room table with a pegboard and all bony prominences were adequately padded. This included an axillary roll and additional padding around the nonoperative leg to prevent compression to the common peroneal nerve.    The left lower extremity was prepped and draped in a sterile fashion.  A time out was performed prior to incision to verify patient's name, date of birth, medical record number, correct site of surgery correct procedure to be performed. Was also used to verify the patient received antibiotics now appropriate instruments, implants and radiographic studies were available in the room. Once all in attendance were in agreement, the case began.    A posterolateral approach was utilized via sharp dissection carried down to the subcutaneous tissue.  Bleeding vessels were coagulated using electrocautery during dissection.  The deep fascia was identified and a key elevator was used to remove adherent subcutaneous tissue.  The deep fascia was then sharply incised with a # 10 blade.  The gluteus maximus muscle was then split in line with its fibers. Self-retaining retractors were  Inserted to retract the deep fascia.  Care was taken to avoid injury to the sciatic nerve.  The hip was then internally rotated and the short external rotators were identified and removed from the greater trochanter with electrocautery and reflected posteriorly to protect the sciatic nerve.  The hip capsule was identified and a T-shaped capsulotomy was performed revealing the femoral head and neck. The leaflets of the capsule were tagged with #2 Tycron for later repair.  The  femoral neck was exposed and osteotomized approximately a finger's breadth above the lesser trochanter.    The attention was then turned to the acetabulum. The acetabulum was exposed using 2 Homan retractors.  After adequate visualization,the labrum and ligamentum teres were  dissected from the acetabulum.  The acetabulum was then sequentially reamed until stable rim fit was achieved..  The trial acetabular component was then placed and had an excellent fit. The trial was then removed and the actual Stryker Trident PSL 56 mm acetabular component was impacted into place.  Appropriate version and inclination was confirmed clinically matching the patient's bony anatomy and using the external alignment guide. A single 73mm Torx cancellus screws were placed through the cluster holes of the acetabular component  A trial polyethylene liner was placed and the retractors removed.    A femoral skid and Cobra retractor were placed under the femoral neck to allow for adequate visualization of the femoral neck. A box osteotome was used to make the initial entry into the proximal femur. A single hand reamer was used to prepare the femoral canal. A T-shaped femoral canal sounder was then used to ensure no penetration femoral cortex had occurred during reaming. The proximal femur was then sequentially broached by hand. Once adequate mediolateral canal fill was achieved the trial broach, neck, and head was assembled and the hip was reduced. It was found to have excellent stability, equivalent leg lengths with functional range of motion.  A portable, intraoperative AP pelvis film was taken to evaluate component size and positioning and to confirm equivalent leg lengths.  After the x-ray, the trial components were then removed.  Attention was turned back to the acetabulum. The trial liner was removed. The acetabulum was copiously irrigated with pulse lavage. A hole eliminator was then placed by hand using a screwdriver. The real polyethylene liner X3 O degree liner was impacted into place.  The femoral canal was copiously irrigated and  the real femoral prosthesis impacted into place with anatomic version.   The patient was found to have stability with a +0 head trial . The actual 36 mm + 0 femoral  head component was then impacted onto the femoral trunion. The hip was then reduced and taken through functional range of motion and found to have excellent stability. Leg lengths were equivalent. The hip joint was then copiously irrigated.  A soft tissue repair of the capsule and piriformis was performed using #2 Tycron.  An anatomic posterior capsular repair was performed.  Additional soft tissue repair of the external rotators was performed via drill holes through the posterior aspect of the greater trochanter.  The deep fascia was then closed with interrupted 0 Vicryl suture. The subcutaneous tissues were closed with 2-0 Vicryl. The skin approximated with staples.  0.25% marcaine plain was injected into the operative site for post-op analgesia.  A dry sterile honeycomb dressing was placed over the incision.  The patient was then placed back in a supine on the operative table. Leg lengths were checked clinically and found to be equivalent. An abduction pillow was placed between the lower extremities. The patient was extubated and transferred to a hospital bed and brought to the PACU in stable condition.   I was scrubbed and present the entire case and all sharp and instrument counts were correct at the conclusion of the case. I spoke with the patient's family including his wife in the postop consultation room to let them know the case had been performed without complication and patient  was stable in recovery room.    Timoteo Gaul, MD Orthopedic Surgeon   06/09/2016 11:15 AM

## 2016-06-10 LAB — CBC
HCT: 32 % — ABNORMAL LOW (ref 40.0–52.0)
HEMOGLOBIN: 11.1 g/dL — AB (ref 13.0–18.0)
MCH: 32.6 pg (ref 26.0–34.0)
MCHC: 34.7 g/dL (ref 32.0–36.0)
MCV: 94.2 fL (ref 80.0–100.0)
Platelets: 220 10*3/uL (ref 150–440)
RBC: 3.4 MIL/uL — ABNORMAL LOW (ref 4.40–5.90)
RDW: 12.5 % (ref 11.5–14.5)
WBC: 6.6 10*3/uL (ref 3.8–10.6)

## 2016-06-10 LAB — BASIC METABOLIC PANEL
Anion gap: 6 (ref 5–15)
BUN: 9 mg/dL (ref 6–20)
CALCIUM: 7.8 mg/dL — AB (ref 8.9–10.3)
CO2: 26 mmol/L (ref 22–32)
CREATININE: 1.04 mg/dL (ref 0.61–1.24)
Chloride: 102 mmol/L (ref 101–111)
Glucose, Bld: 155 mg/dL — ABNORMAL HIGH (ref 65–99)
Potassium: 3.7 mmol/L (ref 3.5–5.1)
SODIUM: 134 mmol/L — AB (ref 135–145)

## 2016-06-10 NOTE — Progress Notes (Signed)
Subjective:  POD #1  S/p left total hip arthroplasty. Patient is up out of bed to a chair. Family and friends at the bedside. Patient reports pain as mild to moderate.  Left hip pain is improved compared to last night. He feels the Toradol has been helpful. He is also taking oxycodone for pain. He denies any shortness breath or chest pain. He has no abdominal pain. He is tolerating a by mouth diet.  Objective:   VITALS:   Vitals:   06/09/16 1412 06/09/16 1938 06/09/16 2349 06/10/16 0401  BP: 111/80 111/68 123/69 116/70  Pulse: 93 87 90 88  Resp: 18 19 19 19   Temp: 98.1 F (36.7 C) 98.2 F (36.8 C) 99.4 F (37.4 C) 98.2 F (36.8 C)  TempSrc: Oral Oral Oral Oral  SpO2: 100% 98% 94% 98%  Weight:      Height:        PHYSICAL EXAM:  Left lower extremity: Patient has moderate sanguinous drainage on his left hip honeycomb dressing. There is no active drainage from the incision. Patient's thigh compartments are soft and compressible. He has intact sensation light touch in palpable pedal pulses. He has intact motor function distally with 5/5 strength. Patient has TED stockings and foot pumps in place. He has no calf tenderness.   LABS  Results for orders placed or performed during the hospital encounter of 06/09/16 (from the past 24 hour(s))  CBC     Status: Abnormal   Collection Time: 06/10/16  3:46 AM  Result Value Ref Range   WBC 6.6 3.8 - 10.6 K/uL   RBC 3.40 (L) 4.40 - 5.90 MIL/uL   Hemoglobin 11.1 (L) 13.0 - 18.0 g/dL   HCT 32.0 (L) 40.0 - 52.0 %   MCV 94.2 80.0 - 100.0 fL   MCH 32.6 26.0 - 34.0 pg   MCHC 34.7 32.0 - 36.0 g/dL   RDW 12.5 11.5 - 14.5 %   Platelets 220 150 - 440 K/uL  Basic metabolic panel     Status: Abnormal   Collection Time: 06/10/16  3:46 AM  Result Value Ref Range   Sodium 134 (L) 135 - 145 mmol/L   Potassium 3.7 3.5 - 5.1 mmol/L   Chloride 102 101 - 111 mmol/L   CO2 26 22 - 32 mmol/L   Glucose, Bld 155 (H) 65 - 99 mg/dL   BUN 9 6 - 20 mg/dL   Creatinine, Ser 1.04 0.61 - 1.24 mg/dL   Calcium 7.8 (L) 8.9 - 10.3 mg/dL   GFR calc non Af Amer >60 >60 mL/min   GFR calc Af Amer >60 >60 mL/min   Anion gap 6 5 - 15    Chest 2 View  Result Date: 06/08/2016 CLINICAL DATA:  Hip surgery. EXAM: CHEST  2 VIEW COMPARISON:  No prior . FINDINGS: Mediastinum and hilar structures normal. Cardiomegaly with normal pulmonary vascularity. No focal pulmonary infiltrate or pleural effusion. Degenerative changes thoracic spine. IMPRESSION: No acute cardiopulmonary disease. Electronically Signed   By: Marcello Moores  Register   On: 06/08/2016 14:09   Dg Pelvis Portable  Result Date: 06/09/2016 CLINICAL DATA:  Left hip replacement EXAM: PORTABLE PELVIS 1-2 VIEWS COMPARISON:  None. FINDINGS: Left hip prosthesis is noted in satisfactory position. No acute bony abnormality is seen. No soft tissue changes are noted. IMPRESSION: Status post left hip replacement Electronically Signed   By: Inez Catalina M.D.   On: 06/09/2016 09:39   Dg Hip Port Unilat With Pelvis 1v Left  Result Date:  06/09/2016 CLINICAL DATA:  Status post left hip replacement EXAM: DG HIP (WITH OR WITHOUT PELVIS) 2V PORT LEFT COMPARISON:  None. FINDINGS: Left hip prosthesis is noted in satisfactory position. No acute bony abnormality or soft tissue abnormality is seen. IMPRESSION: No acute abnormality noted. Electronically Signed   By: Inez Catalina M.D.   On: 06/09/2016 11:21    Assessment/Plan: 1 Day Post-Op   Active Problems:   Status post total hip replacement, left  Patient is doing well postop. His pain is improved today. He is tolerating a by mouth diet and physical therapy. There is no evidence of significant anemia on his laboratory work today. Using care she continue to use incentive spirometry. Patient's Foley catheter will be removed today. Labs and be rechecked in the morning. Continue physical and occupational therapy for now. Patient will start Lovenox today for DVT.  He has completed 24  hours postop antibiotics. prophylaxis.    Thornton Park , MD 06/10/2016, 12:46 PM

## 2016-06-10 NOTE — Care Management (Signed)
Lovenox cost is $ 10.00.

## 2016-06-10 NOTE — Progress Notes (Signed)
Pt remaining alert and oriented. Medicated for pain during the night with good results. Pt able to get some sleep in between care. Tolerated up in the chair without difficulty. Small amount of drainage to dressing. Foley patent and draining urine.

## 2016-06-10 NOTE — Evaluation (Signed)
Occupational Therapy Evaluation Patient Details Name: Duane Grant MRN: [id number] DOB: May 28, 1956 Today's Date: 06/10/2016    History of Present Illness Pt. is a 60 y.o. male who was admitted for a Left THR.   Clinical Impression   Pt. is a 60 y.o. Male who was admitted for a left THR. Pt. presents with limited strength, weakness, pain, and decreased activity tolerance. Pt. could benefit from skilled OTservices for ADL, and IADL training, UE there. Ex., strengthening, and pt. education regarding DME, home modifications, and hip precautions.    Follow Up Recommendations  No OT follow up    Equipment Recommendations       Recommendations for Other Services       Precautions / Restrictions Precautions Precautions: Fall Restrictions Weight Bearing Restrictions: Yes LLE Weight Bearing: Weight bearing as tolerated                                                     ADL Overall ADL's : Needs assistance/impaired Eating/Feeding: Set up   Grooming: Set up               Lower Body Dressing: Moderate assistance                 General ADL Comments: Pt. education was provided about A/E for LE ADLs, and posterior hip precautions/positioning.     Vision     Perception     Praxis      Pertinent Vitals/Pain Pain Assessment: 0-10 Pain Score: 9  Pain Location: Left Hip Fx. Pain Descriptors / Indicators: Dull Pain Intervention(s): Limited activity within patient's tolerance     Hand Dominance Right   Extremity/Trunk Assessment Upper Extremity Assessment Upper Extremity Assessment: Overall WFL for tasks assessed           Communication Communication Communication: No difficulties   Cognition Arousal/Alertness: Awake/alert Behavior During Therapy: WFL for tasks assessed/performed Overall Cognitive Status: Within Functional Limits for tasks assessed                     General Comments       Exercises        Shoulder Instructions      Home Living Family/patient expects to be discharged to:: Private residence Living Arrangements: Spouse/significant other Available Help at Discharge: Family Type of Home: Apartment Home Access: Stairs to enter Technical brewer of Steps: 17 Entrance Stairs-Rails: Can reach both Home Layout: One level               Home Equipment: Walker - 4 wheels;Cane - single point;Crutches          Prior Functioning/Environment Level of Independence: Independent with assistive device(s)                 OT Problem List:     OT Treatment/Interventions: Self-care/ADL training;Therapeutic exercise;Patient/family education;Therapeutic activities;DME and/or AE instruction    OT Goals(Current goals can be found in the care plan section) Acute Rehab OT Goals Patient Stated Goal: To return home, regain independence OT Goal Formulation: With patient Potential to Achieve Goals: Good  OT Frequency: Min 2X/week   Barriers to D/C:            Co-evaluation              End of Session    Activity  Tolerance: Patient tolerated treatment well Patient left: with call bell/phone within reach;in bed;with bed alarm set   Time: 0920-0950 OT Time Calculation (min): 30 min Charges:  OT General Charges $OT Visit: 1 Procedure OT Evaluation $OT Eval Moderate Complexity: 1 Procedure OT Treatments $Self Care/Home Management : 8-22 mins G-Codes:    Harrel Carina, MS, OTR/L 06/10/2016, 10:47 AM

## 2016-06-10 NOTE — Care Management Note (Addendum)
Case Management Note  Patient Details  Name: Duane Grant MRN: [id number] Date of Birth: [birth year]  Subjective/Objective:  POD #1 s/p left THR. Met with patient and his wife Duane [phone]) at bedside. Wife will be the patient care giver. Prior to admission he was independent, used a cane and driving. He has OP PT set up for Oct. 31 at [place]. Pharmacy: [facility]- [place] [phone]. Called Lovenox 40 mg #30, no refills. Ordered 3- in 1 BSC and walker from [facility].                    Action/Plan: OP PT scheduled at [facility] Oct. 31. Lovenox called in. Ordered DME from [facility].   Expected Discharge Date:                  Expected Discharge Plan:  Home/Self Care  In-House Referral:     Discharge planning Services  CM Consult  Post Acute Care Choice:  Durable Medical Equipment Choice offered to:  Patient, Spouse  DME Arranged:    DME Agency:  [place]:    [place] Agency:     Status of Service:  In process, will continue to follow  If discussed at Long Length of Stay Meetings, dates discussed:    Additional Comments:  Jolly Mango, RN 06/10/2016, 11:44 AM

## 2016-06-10 NOTE — Progress Notes (Signed)
Physical Therapy Treatment Patient Details Name: Duane Grant MRN: [id number] DOB: [birth year] Today's Date: 06/10/2016    History of Present Illness Pt. is a 60 y.o. male who was admitted for a Left THR.    PT Comments    Pt is making good progress towards goals with increased ambulation this date. Pt still with difficulty advancing L LE secondary to pain. Pt very motivated and is only limited by pain. Good endurance with there-ex, however is eager to [employer]. Pt cued to only perform a few in morning and few in afternoon in order to not fatigue the hip. Pt able to recite 2/3 hip precautions, gave written HEP and reviewed. Cues given for maintaining during all mobility. Secondary to difficulty with gait, will need supervision for mobility at home.  Follow Up Recommendations  Home health PT;Supervision for mobility/OOB     Equipment Recommendations  Rolling walker with 5" wheels    Recommendations for Other Services       Precautions / Restrictions Precautions Precautions: Posterior Hip;Fall Precaution Booklet Issued: Yes (comment) Restrictions Weight Bearing Restrictions: Yes LLE Weight Bearing: Weight bearing as tolerated    Mobility  Bed Mobility Overal bed mobility: Needs Assistance Bed Mobility: Supine to Sit     Supine to sit: Mod assist     General bed mobility comments: assist for sequencing and assist for scooting out towards EOB. Once seated at EOB, pt able to sit with supervision. Pt limited by severe pain with mobility. Cues to maintain correct hip precautions during all mobility.  Transfers Overall transfer level: Needs assistance Equipment used: Rolling walker (2 wheeled) Transfers: Sit to/from Stand Sit to Stand: Min assist         General transfer comment: transfers performed with RW and safe technique. Pt cued for pushing from seated surface. Once standing, pt able to stand with supervision  Ambulation/Gait Ambulation/Gait assistance: Min  guard Ambulation Distance (Feet): 35 Feet Assistive device: Rolling walker (2 wheeled) Gait Pattern/deviations: Step-to pattern     General Gait Details: ambulated using slow step to gait pattern. Initially, pt has difficulty with picking L foot up, causing toe drag. Pt able to compensate by increased knee flexion to improve toe clearance. Pt with increased pain with mobility. Pt educated on correct WBing.   Stairs            Wheelchair Mobility    Modified Rankin (Stroke Patients Only)       Balance                                    Cognition Arousal/Alertness: Awake/alert Behavior During Therapy: WFL for tasks assessed/performed Overall Cognitive Status: Within Functional Limits for tasks assessed                      Exercises Other Exercises Other Exercises: supine ther-ex performed including L LE ankle pumps, quad sets, glut sets, SAQ, and hip abd/add. All ther-ex performed x 12 reps with min assist for correct technique.    General Comments        Pertinent Vitals/Pain Pain Assessment: 0-10 Pain Score: 8  Pain Location: L hip Pain Descriptors / Indicators: Operative site guarding Pain Intervention(s): Limited activity within patient's tolerance    Home Living Family/patient expects to be discharged to:: Private residence Living Arrangements: Spouse/significant other Available Help at Discharge: Family Type of Home: Apartment Home Access: Stairs to enter Entrance  Stairs-Rails: Can reach both Home Layout: One level Home Equipment: Walker - 4 wheels;Cane - single point;Crutches      Prior Function Level of Independence: Independent with assistive device(s)          PT Goals (current goals can now be found in the care plan section) Acute Rehab PT Goals Patient Stated Goal: To return home, regain independence PT Goal Formulation: With patient Time For Goal Achievement: 06/23/16 Potential to Achieve Goals: Good Progress  towards PT goals: Progressing toward goals    Frequency    BID      PT Plan Current plan remains appropriate    Co-evaluation             End of Session Equipment Utilized During Treatment: Gait belt Activity Tolerance: Patient limited by pain Patient left: in chair;with chair alarm set     Time: [id number] PT Time Calculation (min) (ACUTE ONLY): 39 min  Charges:  $Gait Training: 23-37 mins $Therapeutic Exercise: 8-22 mins                    G Codes:      Marguerite Barba 2016-07-07, 12:01 PM Greggory Stallion, PT, DPT [phone]

## 2016-06-10 NOTE — Progress Notes (Signed)
Clinical Social Worker (CSW) received SNF consult. PT is recommending home health. RN case manager is aware of above. Please reconsult if future social work needs arise. CSW signing off.   Kamani Lewter, LCSW [phone] 

## 2016-06-10 NOTE — Progress Notes (Signed)
Physical Therapy Treatment Patient Details Name: Duane Grant MRN: [id number] DOB: [birth year] Today's Date: 06/10/2016    History of Present Illness Pt. is a 60 y.o. male who was admitted for a Left THR.    PT Comments    Pt is making good progress towards goals with increased ambulation distance this session. Pt continues to be motivated to perform therapy. Able to recite 3/3 hip precautions this date. Still has difficulty with ambulation and progression of L foot, however improved from AM session. Very slow gait speed. Good endurance with all there-ex.  Follow Up Recommendations  Home health PT;Supervision for mobility/OOB     Equipment Recommendations  Rolling walker with 5" wheels    Recommendations for Other Services       Precautions / Restrictions Precautions Precautions: Posterior Hip;Fall Precaution Booklet Issued: Yes (comment) Restrictions Weight Bearing Restrictions: Yes LLE Weight Bearing: Weight bearing as tolerated    Mobility  Bed Mobility Overal bed mobility: Needs Assistance Bed Mobility: Sit to Supine     Supine to sit: Mod assist Sit to supine: Min assist   General bed mobility comments: assist for sequencing and scooting back into bed. Assist required for bringing B LE up onto bed. Trapeze bar required for scooting up in bed.  Transfers Overall transfer level: Needs assistance Equipment used: Rolling walker (2 wheeled) Transfers: Sit to/from Stand Sit to Stand: Min guard         General transfer comment: improved technique with transfer with pt remembering sequencing. Once standing, pt able to stand upright with RW  Ambulation/Gait Ambulation/Gait assistance: Min guard Ambulation Distance (Feet): 70 Feet Assistive device: Rolling walker (2 wheeled) Gait Pattern/deviations: Step-through pattern     General Gait Details: Pt ambulated starting with slow step to gait pattern. Pt able to progress to reciprocal gait pattern, however slow  technique noted. Pt cues for turns and maintaining precautions. Increased pain noted with WBing. Still has some difficulty with advancing L foot   Stairs            Wheelchair Mobility    Modified Rankin (Stroke Patients Only)       Balance                                    Cognition Arousal/Alertness: Awake/alert Behavior During Therapy: WFL for tasks assessed/performed Overall Cognitive Status: Within Functional Limits for tasks assessed                      Exercises Other Exercises Other Exercises: seated ther-ex performed including L LE ankle pumps, quad sets, glut sets, LAQ, and hip ab/add. All ther-ex performed x 12 reps with min assist for correct technique    General Comments        Pertinent Vitals/Pain Pain Assessment: 0-10 Pain Score: 4  Pain Location: L hip Pain Descriptors / Indicators: Operative site guarding Pain Intervention(s): Limited activity within patient's tolerance    Home Living Family/patient expects to be discharged to:: Private residence Living Arrangements: Spouse/significant other Available Help at Discharge: Family Type of Home: Apartment Home Access: Stairs to enter Entrance Stairs-Rails: Can reach both Home Layout: One level Home Equipment: Environmental consultant - 4 wheels;Cane - single point;Crutches      Prior Function Level of Independence: Independent with assistive device(s)          PT Goals (current goals can now be found in the care plan  section) Acute Rehab PT Goals Patient Stated Goal: To return home, regain independence PT Goal Formulation: With patient Time For Goal Achievement: 06/23/16 Potential to Achieve Goals: Good Progress towards PT goals: Progressing toward goals    Frequency    BID      PT Plan Current plan remains appropriate    Co-evaluation             End of Session Equipment Utilized During Treatment: Gait belt Activity Tolerance: Patient limited by pain Patient  left: in bed;with bed alarm set     Time: [id number] PT Time Calculation (min) (ACUTE ONLY): 38 min  Charges:  $Gait Training: 23-37 mins $Therapeutic Exercise: 8-22 mins                    G Codes:      Duane Grant 23-Jun-2016, 2:39 PM Greggory Stallion, PT, DPT [phone]

## 2016-06-11 LAB — CBC
HCT: 31.6 % — ABNORMAL LOW (ref 40.0–52.0)
Hemoglobin: 10.8 g/dL — ABNORMAL LOW (ref 13.0–18.0)
MCH: 32.5 pg (ref 26.0–34.0)
MCHC: 34.3 g/dL (ref 32.0–36.0)
MCV: 94.8 fL (ref 80.0–100.0)
PLATELETS: 211 10*3/uL (ref 150–440)
RBC: 3.33 MIL/uL — ABNORMAL LOW (ref 4.40–5.90)
RDW: 12.5 % (ref 11.5–14.5)
WBC: 8.2 10*3/uL (ref 3.8–10.6)

## 2016-06-11 NOTE — Progress Notes (Signed)
  Subjective:  POD # 2 s/p left total hip.  Patient reports pain as mild.  Patient denies any shortness of breath, chest pain or abdominal pain. He has not passed a bowel movement.  Objective:   VITALS:   Vitals:   06/10/16 1937 06/11/16 0323 06/11/16 0741 06/11/16 1405  BP: 121/74 129/86 134/72 121/71  Pulse: 99 94 87 94  Resp: 19 20 18 18   Temp: 99.9 F (37.7 C) 99.4 F (37.4 C) 98.1 F (36.7 C) 98.5 F (36.9 C)  TempSrc: Oral Oral Oral Oral  SpO2: 96% 98% 99% 97%  Weight:      Height:        PHYSICAL EXAM:  Left lower extremity: I percent changed patient's dressing today. He has a new honeycomb dressing. The incision was clean dry and intact. There is no evidence of active drainage. Patient has intact sensation light touch throughout the left lower extremity with palpable pedal pulses. He has intact motor function and can flex and extend his toes and dorsiflex plantarflex his ankle. He had no calf tenderness or lower leg edema. He is wearing TED stockings and foot pumps.   LABS  Results for orders placed or performed during the hospital encounter of 06/09/16 (from the past 24 hour(s))  CBC     Status: Abnormal   Collection Time: 06/11/16  5:32 AM  Result Value Ref Range   WBC 8.2 3.8 - 10.6 K/uL   RBC 3.33 (L) 4.40 - 5.90 MIL/uL   Hemoglobin 10.8 (L) 13.0 - 18.0 g/dL   HCT 31.6 (L) 40.0 - 52.0 %   MCV 94.8 80.0 - 100.0 fL   MCH 32.5 26.0 - 34.0 pg   MCHC 34.3 32.0 - 36.0 g/dL   RDW 12.5 11.5 - 14.5 %   Platelets 211 150 - 440 K/uL    No results found.  Assessment/Plan: 2 Days Post-Op   Active Problems:   Status post total hip replacement, left  Patient well postop. Patient is making expected progress with physical therapy. His pain is well-controlled currently.  His labs demonstrate no significant anemia. Patient is written for bowel medications and is awaiting a bowel movement. Continue physical and occupational therapy. Likely discharge home tomorrow. Patient  will have outpatient physical therapy upon discharge.    Thornton Park , MD 06/11/2016, 2:49 PM

## 2016-06-11 NOTE — Care Management Note (Signed)
Case Management Note  Patient Details  Name: Duane Grant MRN: [id number] Date of Birth: 09-03-1955  Subjective/Objective:     RW and BSC are at bedside in Mr Fittro's room for him to take home with him today. Mr Cleere verbalized to this Probation officer that he is to follow up with the [facility] to schedule Outpatient PT next week.                Action/Plan:   Expected Discharge Date:                  Expected Discharge Plan:  Home/Self Care  In-House Referral:     Discharge planning Services  CM Consult  Post Acute Care Choice:  Durable Medical Equipment Choice offered to:  Patient, Spouse  DME Arranged:    DME Agency:  [place]:    [place] Agency:     Status of Service:  In process, will continue to follow  If discussed at Long Length of Stay Meetings, dates discussed:    Additional Comments:  Jozlin Bently A, RN 06/11/2016, 11:21 AM

## 2016-06-11 NOTE — Progress Notes (Signed)
Physical Therapy Treatment Patient Details Name: Duane Grant MRN: [id number] DOB: 12/25/1955 Today's Date: 06/11/2016    History of Present Illness Pt. is a 60 y.o. male who was admitted for a Left THR.    PT Comments    Pt agrees to session.  Participated in exercises as described below.  Pt required mod assist for supine to sit at edge of bed with mod verbal cues for hand placements.  Ambulation in hallway with slow but steady gait.  He was able to go up/down 8 stairs with bilateral rails and overall steady gait.  Pt reports being comfortable with stairs.  Reports having 18 steps into second story apartment.   Follow Up Recommendations  Supervision for mobility/OOB;Outpatient PT (PT reports he will be receiving )     Equipment Recommendations  Rolling walker with 5" wheels    Recommendations for Other Services       Precautions / Restrictions Precautions Precautions: Posterior Hip;Fall Restrictions Weight Bearing Restrictions: Yes LLE Weight Bearing: Weight bearing as tolerated    Mobility  Bed Mobility Overal bed mobility: Needs Assistance Bed Mobility: Sit to Supine     Supine to sit: Mod assist        Transfers Overall transfer level: Needs assistance Equipment used: Rolling walker (2 wheeled) Transfers: Sit to/from Stand Sit to Stand: Min guard         General transfer comment: verbal cues for hand placements  Ambulation/Gait Ambulation/Gait assistance: Min guard Ambulation Distance (Feet): 100 Feet Assistive device: Rolling walker (2 wheeled) Gait Pattern/deviations: Step-to pattern   Gait velocity interpretation: <1.8 ft/sec, indicative of risk for recurrent falls General Gait Details: step to pattern initially progressing to ster though uneven   Stairs Stairs: Yes Stairs assistance: Min guard Stair Management: Two rails Number of Stairs: 8 General stair comments: navigating well  Wheelchair Mobility    Modified Rankin (Stroke Patients  Only)       Balance Overall balance assessment: Needs assistance Sitting-balance support: Feet supported Sitting balance-Leahy Scale: Good     Standing balance support: Bilateral upper extremity supported Standing balance-Leahy Scale: Good                      Cognition Arousal/Alertness: Awake/alert Behavior During Therapy: WFL for tasks assessed/performed Overall Cognitive Status: Within Functional Limits for tasks assessed                      Exercises Other Exercises Other Exercises: supine exercises, for ankle pumps, quad sets, glut squeeses, heel slides and ab/adduction x 10, seated LAQ x 10 Other Exercises: standing exercises hip./knee flexion  x 10     General Comments        Pertinent Vitals/Pain Pain Assessment: 0-10 Pain Score: 4  Pain Location: L hip Pain Descriptors / Indicators: Operative site guarding;Sore Pain Intervention(s): Limited activity within patient's tolerance;RN gave pain meds during session    Home Living                      Prior Function            PT Goals (current goals can now be found in the care plan section) Acute Rehab PT Goals Patient Stated Goal: To return home, regain independence Progress towards PT goals: Progressing toward goals    Frequency    BID      PT Plan Current plan remains appropriate    Co-evaluation  End of Session Equipment Utilized During Treatment: Gait belt Activity Tolerance: Patient limited by pain Patient left: in chair;with call bell/phone within reach;with chair alarm set     Time: [phone] PT Time Calculation (min) (ACUTE ONLY): 55 min  Charges:  $Gait Training: 23-37 mins $Therapeutic Exercise: 8-22 mins                    G Codes:      [name] 2016-06-27, 9:33 AM

## 2016-06-11 NOTE — Progress Notes (Signed)
Pts VSS, A&OX4, pain being managed with PRN medications. Pt tolerated physical therapy and working to improve ambulation. Pt remains on RA with o2 sats WNL. Pt tolerating regular diet, no BM reported, continued on bowel regimen. No fall or injury throughout shift. Will continue to monitor.

## 2016-06-11 NOTE — Progress Notes (Signed)
Physical Therapy Treatment Patient Details Name: GRALYN MAGIERA MRN: [id number] DOB: May 12, 1956 Today's Date: 06/11/2016    History of Present Illness Pt. is a 60 y.o. male who was admitted for a Left THR.    PT Comments    Agrees to pm session.  Was able to ambulate to/from bathroom and on nursing unit with min guard.  Slow but steady gait.  Reviewed need for staff assistance during mobility as he stated he was going to get up and ambulate to bathroom without assistance.  Verbalized understanding.   Follow Up Recommendations  Supervision for mobility/OOB;Outpatient PT     Equipment Recommendations  Rolling walker with 5" wheels    Recommendations for Other Services       Precautions / Restrictions Precautions Precautions: Posterior Hip;Fall Restrictions Weight Bearing Restrictions: Yes LLE Weight Bearing: Weight bearing as tolerated    Mobility  Bed Mobility               General bed mobility comments: in chair and remained oob at end of session  Transfers Overall transfer level: Needs assistance Equipment used: Rolling walker (2 wheeled) Transfers: Sit to/from Stand Sit to Stand: Min guard         General transfer comment: verbal cues for hand placements  Ambulation/Gait Ambulation/Gait assistance: Min guard Ambulation Distance (Feet): 100 Feet Assistive device: Rolling walker (2 wheeled) Gait Pattern/deviations: Step-through pattern   Gait velocity interpretation: <1.8 ft/sec, indicative of risk for recurrent falls     Stairs            Wheelchair Mobility    Modified Rankin (Stroke Patients Only)       Balance Overall balance assessment: Needs assistance Sitting-balance support: Feet supported Sitting balance-Leahy Scale: Good     Standing balance support: Bilateral upper extremity supported Standing balance-Leahy Scale: Good                      Cognition Arousal/Alertness: Awake/alert Behavior During Therapy: WFL for  tasks assessed/performed Overall Cognitive Status: Within Functional Limits for tasks assessed                      Exercises Other Exercises Other Exercises: to bathroom with good standing balance to urinate    General Comments        Pertinent Vitals/Pain Pain Assessment: 0-10 Pain Score: 4  Pain Location: L Hip Pain Descriptors / Indicators: Sore;Operative site guarding Pain Intervention(s): Limited activity within patient's tolerance;Repositioned    Home Living                      Prior Function            PT Goals (current goals can now be found in the care plan section) Acute Rehab PT Goals Patient Stated Goal: To return home, regain independence Progress towards PT goals: Progressing toward goals    Frequency    BID      PT Plan Current plan remains appropriate    Co-evaluation             End of Session Equipment Utilized During Treatment: Gait belt Activity Tolerance: Patient limited by pain Patient left: in chair;with call bell/phone within reach;with chair alarm set     Time: 1040-1105 PT Time Calculation (min) (ACUTE ONLY): 25 min  Charges:  $Gait Training: 23-37 mins  G Codes:      [name] 06/11/2016, 12:36 PM

## 2016-06-11 NOTE — Progress Notes (Signed)
Occupational Therapy Treatment Patient Details Name: Duane Grant MRN: [id number] DOB: 11/17/1955 Today's Date: 06/11/2016    History of present illness Pt. is a 60 y.o. male who was admitted for a Left THR.   OT comments  Patient was sitting up in recliner when OT arrived. Eager to participate in OT. Patient able to recall 2 of 3 posterior hip precautions at beginning of session. Educated on hip precautions, safety in the home, and use of AE for lower body dressing/ADL tasks. Patient able to recall use of AE, and able to demonstrate with MIN A. Cuing at times for maintaining hip precautions during tasks. Patient able to verbalize safety education, and appears to have good safety awareness. At end of session, patient was able to recall 3 of 3 hip precautions without cuing.   Follow Up Recommendations  No OT follow up    Equipment Recommendations       Recommendations for Other Services      Precautions / Restrictions Precautions Precautions: Posterior Hip;Fall Restrictions Weight Bearing Restrictions: Yes LLE Weight Bearing: Weight bearing as tolerated       Mobility Bed Mobility                  Transfers                      Balance                                   ADL Overall ADL's : Needs assistance/impaired                     Lower Body Dressing: Minimal assistance;With adaptive equipment;Adhering to hip precautions;Cueing for compensatory techniques                 General ADL Comments: Pt. education was provided about A/E for LE ADLs, and posterior hip precautions/positioning.      Vision                     Perception     Praxis      Cognition   Behavior During Therapy: WFL for tasks assessed/performed Overall Cognitive Status: Within Functional Limits for tasks assessed                       Extremity/Trunk Assessment               Exercises     Shoulder Instructions       General Comments      Pertinent Vitals/ Pain       Pain Assessment: 0-10 Pain Score: 4  Pain Location: L Hip Pain Descriptors / Indicators: Sore;Operative site guarding Pain Intervention(s): Limited activity within patient's tolerance;Repositioned  Home Living                                          Prior Functioning/Environment              Frequency  Min 2X/week        Progress Toward Goals  OT Goals(current goals can now be found in the care plan section)  Progress towards OT goals: Progressing toward goals  Acute Rehab OT Goals Patient Stated Goal: To return home, regain independence OT Goal Formulation: With  patient Potential to Achieve Goals: Good  Plan Discharge plan remains appropriate    Co-evaluation                 End of Session     Activity Tolerance Patient tolerated treatment well   Patient Left in chair;with call bell/phone within reach   Nurse Communication          Time: [id number] OT Time Calculation (min): 40 min  Charges: OT General Charges $OT Visit: 1 Procedure OT Treatments $Self Care/Home Management : 38-52 mins  Larry Alcock L 06/11/2016, 12:22 PM

## 2016-06-12 LAB — CBC
HEMATOCRIT: 31 % — AB (ref 40.0–52.0)
HEMOGLOBIN: 10.9 g/dL — AB (ref 13.0–18.0)
MCH: 32.8 pg (ref 26.0–34.0)
MCHC: 35.1 g/dL (ref 32.0–36.0)
MCV: 93.3 fL (ref 80.0–100.0)
PLATELETS: 225 10*3/uL (ref 150–440)
RBC: 3.32 MIL/uL — AB (ref 4.40–5.90)
RDW: 12.4 % (ref 11.5–14.5)
WBC: 8.7 10*3/uL (ref 3.8–10.6)

## 2016-06-12 MED ORDER — OXYCODONE HCL 5 MG PO TABS: 5.0000 mg | ORAL_TABLET | ORAL | 0 refills | Status: DC | PRN

## 2016-06-12 NOTE — Progress Notes (Signed)
  Subjective:  S/p left THA.  POD #3 Patient reports left hip pain as mild.  No SOB, CP, or abdominal pain.  Patient has had a bowel movement. He is doing well with physical therapy.  Objective:   VITALS:   Vitals:   06/11/16 0741 06/11/16 1405 06/11/16 1943 06/12/16 0321  BP: 134/72 121/71 138/70 (!) 150/68  Pulse: 87 94 98 93  Resp: 18 18 19 19   Temp: 98.1 F (36.7 C) 98.5 F (36.9 C) 98.7 F (37.1 C) 98.3 F (36.8 C)  TempSrc: Oral Oral Oral Oral  SpO2: 99% 97% 98% 95%  Weight:      Height:        PHYSICAL EXAM:  Left lower extremity:  Dressing is clean dry and intact. Thigh soft and compressible. He has palpable pedal pulses and intact sensation to touch. He can dorsiflex and plantarflex his ankle with 5 strength. He has no calf tenderness or lower leg edema. He is wearing TED stockings and foot pumps.  LABS  Results for orders placed or performed during the hospital encounter of 06/09/16 (from the past 24 hour(s))  CBC     Status: Abnormal   Collection Time: 06/12/16  3:11 AM  Result Value Ref Range   WBC 8.7 3.8 - 10.6 K/uL   RBC 3.32 (L) 4.40 - 5.90 MIL/uL   Hemoglobin 10.9 (L) 13.0 - 18.0 g/dL   HCT 31.0 (L) 40.0 - 52.0 %   MCV 93.3 80.0 - 100.0 fL   MCH 32.8 26.0 - 34.0 pg   MCHC 35.1 32.0 - 36.0 g/dL   RDW 12.4 11.5 - 14.5 %   Platelets 225 150 - 440 K/uL    No results found.  Assessment/Plan: 3 Days Post-Op   Active Problems:   Status post total hip replacement, left   Patient doing well postop and will be discharged home today. He has an outpatient physical therapy appointment in our office on Tuesday. He'll be on Lovenox 40 mg daily for DVT prophylaxis on enteric-coated aspirin 325 mg by mouth twice a day if his insurance does not cover the Lovenox. Patient will follow-up with me in approximately 10 days in the office for wound check, staple removal and x-ray.   Thornton Park , MD 06/12/2016, 1:36 PM

## 2016-06-12 NOTE — Progress Notes (Signed)
Patient discharged home. DC instructions provided and explained. Medications reviewed. Rx given. All questions answered. Lovenox teaching provided to patient and wife. Verbalization of understanding. Pt denies pain at present. Pt stable at discharge

## 2016-06-12 NOTE — Progress Notes (Signed)
Physical Therapy Treatment Patient Details Name: Duane Grant MRN: [id number] DOB: Jun 20, 1956 Today's Date: 06/12/2016    History of Present Illness Pt. is a 60 y.o. male who was admitted for a Left THR.    PT Comments    Patient initially taking step to pattern with poor foot clearance on LLE, educated to use step through pattern which increased stride length and foot clearance. Patient able to manage stairs safely, bed mobility continues to be difficult, though alternative strategies provided which he reported were much faster/easier for him to manage. Patient tolerating mobility well with few if any complaints of pain. He is progressing well towards mobility goals.   Follow Up Recommendations  Supervision for mobility/OOB;Outpatient PT     Equipment Recommendations  Rolling walker with 5" wheels    Recommendations for Other Services       Precautions / Restrictions Precautions Precautions: Posterior Hip;Fall Restrictions Weight Bearing Restrictions: Yes LLE Weight Bearing: Weight bearing as tolerated    Mobility  Bed Mobility Overal bed mobility: Needs Assistance Bed Mobility: Sit to Supine;Supine to Sit     Supine to sit: Min assist Sit to supine: Min assist   General bed mobility comments: Patient cued to bridge through RLE and use UEs to aide in bring LLE to EOB, he did require min A x1 from therapist to bring LLE to EOB. For return to bed patient able to scoot his COM onto bed, but required min A x1 to bring LLE from dangling back on to bed surface.   Transfers Overall transfer level: Needs assistance Equipment used: Rolling walker (2 wheeled) Transfers: Sit to/from Stand Sit to Stand: Supervision         General transfer comment: Patient required minimal cuing for hand placement, had LLE in front of RLE to complete transfer. No loss of balance noted.   Ambulation/Gait   Ambulation Distance (Feet): 125 Feet Assistive device: Rolling walker (2  wheeled) Gait Pattern/deviations: Step-through pattern   Gait velocity interpretation: Below normal speed for age/gender General Gait Details: Patient initially taking step to pattern with poor foot clearance on LLE. Educated patient to bring RLE past LLE, which increased stride length and foot clearance.    Stairs Stairs: Yes Stairs assistance: Supervision Stair Management: Two rails;Step to pattern Number of Stairs: 8 General stair comments: Sequencing was appropriate, cuing for mechanics of transferring from hands on RW to hands on railing.   Wheelchair Mobility    Modified Rankin (Stroke Patients Only)       Balance Overall balance assessment: Needs assistance Sitting-balance support: Feet supported Sitting balance-Leahy Scale: Good     Standing balance support: Bilateral upper extremity supported Standing balance-Leahy Scale: Good                      Cognition Arousal/Alertness: Awake/alert Behavior During Therapy: WFL for tasks assessed/performed Overall Cognitive Status: Within Functional Limits for tasks assessed                      Exercises Total Joint Exercises Ankle Circles/Pumps: AROM;Both;10 reps Heel Slides: AROM;Right;AAROM;Left;10 reps Hip ABduction/ADduction: AROM;Right;AAROM;Left;10 reps Straight Leg Raises: AROM;Right;AAROM;Left;10 reps Long Arc Quad: AROM;Both;15 reps    General Comments        Pertinent Vitals/Pain Pain Assessment:  (Patient reported pain as mild to moderate with movement, reduced after ambulation. ) Pain Location: L hip Pain Descriptors / Indicators: Operative site guarding;Aching Pain Intervention(s): Limited activity within patient's tolerance;Repositioned    Home  Living                      Prior Function            PT Goals (current goals can now be found in the care plan section) Acute Rehab PT Goals Patient Stated Goal: To return home, regain independence Time For Goal Achievement:  06/23/16 Potential to Achieve Goals: Good Additional Goals Additional Goal #1: Pt will be able to perform bed mobility/transfers with cga and RW in order to safely improve functional independence Progress towards PT goals: Progressing toward goals    Frequency    BID      PT Plan Current plan remains appropriate    Co-evaluation             End of Session Equipment Utilized During Treatment: Gait belt Activity Tolerance: Patient tolerated treatment well Patient left: in bed;with bed alarm set;with call bell/phone within reach;with family/visitor present     Time: [id number] PT Time Calculation (min) (ACUTE ONLY): 38 min  Charges:  $Gait Training: 23-37 mins $Therapeutic Exercise: 8-22 mins                    G Codes:      Kerman Passey, PT, DPT    06/12/2016, 12:33 PM

## 2016-06-12 NOTE — Discharge Summary (Signed)
Physician Discharge Summary  Patient ID: Duane Grant MRN: [id number] DOB/AGE: 62/30/1957 62 y.o.  Admit date: 06/09/2016 Discharge date: 06/12/2016  Admission Diagnoses:  M16.12 Unilateral primary osteoarthritis, left hip <principal problem not specified>  Discharge Diagnoses:  M16.12 Unilateral primary osteoarthritis, left hip Active Problems:   Status post total hip replacement, left   Past Medical History:  Diagnosis Date  . Arthritis   . Cancer [facility]) 2009   Prostate, radiation but no surgery  . Diabetes mellitus without complication ([place])   . Hypertension     Surgeries: Procedure(s): LEFT TOTAL HIP ARTHROPLASTY on 06/09/2016   Consultants (if any):   Discharged Condition: Improved  Hospital Course: Duane Grant is an 60 y.o. male who was admitted 06/09/2016 with a diagnosis of  M16.12 Unilateral primary osteoarthritis, left hip <principal problem not specified> and went to the operating room on [id number] an uncomplicated left total hip arthroplasty.    He was given perioperative antibiotics:  Anti-infectives    Start     Dose/Rate Route Frequency Ordered Stop   06/09/16 1400  ceFAZolin (ANCEF) IVPB 1 g/50 mL premix     1 g 100 mL/hr over 30 Minutes Intravenous Every 6 hours 06/09/16 1157 06/09/16 2009   06/09/16 1400  clindamycin (CLEOCIN) IVPB 600 mg     600 mg 100 mL/hr over 30 Minutes Intravenous Every 8 hours 06/09/16 1157 06/09/16 2144   06/09/16 0600  clindamycin (CLEOCIN) 600 MG/50ML IVPB    Comments:  Ronnell Freshwater: cabinet override      06/09/16 0600 06/09/16 0810   06/09/16 0600  ceFAZolin (ANCEF) 2-4 GM/100ML-% IVPB    Comments:  Ronnell Freshwater: cabinet override      06/09/16 0600 06/09/16 0820   06/09/16 0200  clindamycin (CLEOCIN) IVPB 600 mg     600 mg 100 mL/hr over 30 Minutes Intravenous  Once 06/09/16 0147 06/09/16 0810   06/09/16 0147  ceFAZolin (ANCEF) IVPB 2g/100 mL premix     2 g 200 mL/hr over 30 Minutes Intravenous On call to O.R.  06/09/16 0147 06/09/16 0820    .  He was given sequential compression devices, early ambulation, and Lovenox for DVT prophylaxis.  He benefited maximally from the hospital stay and there were no complications.    Recent vital signs:  Vitals:   06/11/16 1943 06/12/16 0321  BP: 138/70 (!) 150/68  Pulse: 98 93  Resp: 19 19  Temp: 98.7 F (37.1 C) 98.3 F (36.8 C)    Recent laboratory studies:  Lab Results  Component Value Date   HGB 10.9 (L) 06/12/2016   HGB 10.8 (L) 06/11/2016   HGB 11.1 (L) 06/10/2016   Lab Results  Component Value Date   WBC 8.7 06/12/2016   PLT 225 06/12/2016   Lab Results  Component Value Date   INR 0.88 06/08/2016   Lab Results  Component Value Date   NA 134 (L) 06/10/2016   K 3.7 06/10/2016   CL 102 06/10/2016   CO2 26 06/10/2016   BUN 9 06/10/2016   CREATININE 1.04 06/10/2016   GLUCOSE 155 (H) 06/10/2016    Discharge Medications:     Medication List    STOP taking these medications   meloxicam 15 MG tablet Commonly known as:  MOBIC   methocarbamol 500 MG tablet Commonly known as:  ROBAXIN   traMADol 50 MG tablet Commonly known as:  ULTRAM     TAKE these medications   aspirin EC 81 MG tablet Take 81 mg by  mouth daily.   Fish Oil 1200 MG Caps Take 1 capsule by mouth 2 (two) times daily.   lisinopril-hydrochlorothiazide 10-12.5 MG tablet Commonly known as:  PRINZIDE,ZESTORETIC Take 0.5 tablets by mouth daily.   metFORMIN 1000 MG tablet Commonly known as:  GLUCOPHAGE Take 1,000 mg by mouth 2 (two) times daily with a meal.   oxyCODONE 5 MG immediate release tablet Commonly known as:  Oxy IR/ROXICODONE Take 1-2 tablets (5-10 mg total) by mouth every 4 (four) hours as needed for moderate pain.   tamsulosin 0.4 MG Caps capsule Commonly known as:  FLOMAX Take 0.4 mg by mouth daily after supper.       Diagnostic Studies: Chest 2 View  Result Date: 06/08/2016 CLINICAL DATA:  Hip surgery. EXAM: CHEST  2 VIEW  COMPARISON:  No prior . FINDINGS: Mediastinum and hilar structures normal. Cardiomegaly with normal pulmonary vascularity. No focal pulmonary infiltrate or pleural effusion. Degenerative changes thoracic spine. IMPRESSION: No acute cardiopulmonary disease. Electronically Signed   By: Marcello Moores  Register   On: 06/08/2016 14:09   Dg Pelvis Portable  Result Date: 06/09/2016 CLINICAL DATA:  Left hip replacement EXAM: PORTABLE PELVIS 1-2 VIEWS COMPARISON:  None. FINDINGS: Left hip prosthesis is noted in satisfactory position. No acute bony abnormality is seen. No soft tissue changes are noted. IMPRESSION: Status post left hip replacement Electronically Signed   By: Inez Catalina M.D.   On: 06/09/2016 09:39   Dg Hip Port Unilat With Pelvis 1v Left  Result Date: 06/09/2016 CLINICAL DATA:  Status post left hip replacement EXAM: DG HIP (WITH OR WITHOUT PELVIS) 2V PORT LEFT COMPARISON:  None. FINDINGS: Left hip prosthesis is noted in satisfactory position. No acute bony abnormality or soft tissue abnormality is seen. IMPRESSION: No acute abnormality noted. Electronically Signed   By: Inez Catalina M.D.   On: 06/09/2016 11:21    Disposition: Final discharge disposition not confirmed  Discharge Instructions    Call MD / Call 911    Complete by:  As directed    If you experience chest pain or shortness of breath, CALL 911 and be transported to the hospital emergency room.  If you develope a fever above 101 F, pus (white drainage) or increased drainage or redness at the wound, or calf pain, call your surgeon's office.   Constipation Prevention    Complete by:  As directed    Drink plenty of fluids.  Prune juice may be helpful.  You may use a stool softener, such as Colace (over the counter) 100 mg twice a day.  Use MiraLax (over the counter) for constipation as needed.   Diet general    Complete by:  As directed    Driving restrictions    Complete by:  As directed    No driving until follow up with Dr.  Mack Guise   Follow the hip precautions as taught in Physical Therapy    Complete by:  As directed    Increase activity slowly as tolerated    Complete by:  As directed    Lifting restrictions    Complete by:  As directed    No lifting for 12-16 weeks   TED hose    Complete by:  As directed    Use stockings (TED hose) for 2 weeks on both leg(s).  You may remove them at night for sleeping.         Signed: Thornton Park ,MD 06/12/2016, 1:43 PM

## 2016-06-13 NOTE — Anesthesia Postprocedure Evaluation (Signed)
Anesthesia Post Note  Patient: Duane Grant  Procedure(s) Performed: Procedure(s) (LRB): TOTAL HIP ARTHROPLASTY (Left)  Patient location during evaluation: PACU Anesthesia Type: General Level of consciousness: awake and alert Pain management: pain level controlled Vital Signs Assessment: post-procedure vital signs reviewed and stable Respiratory status: spontaneous breathing, nonlabored ventilation, respiratory function stable and patient connected to nasal cannula oxygen Cardiovascular status: blood pressure returned to baseline and stable Postop Assessment: no signs of nausea or vomiting Anesthetic complications: no    Last Vitals:  Vitals:   06/11/16 1943 06/12/16 0321  BP: 138/70 (!) 150/68  Pulse: 98 93  Resp: 19 19  Temp: 37.1 C 36.8 C    Last Pain:  Vitals:   06/12/16 1059  TempSrc:   PainSc: [place]

## 2016-06-14 LAB — SURGICAL PATHOLOGY

## 2016-08-24 ENCOUNTER — Ambulatory Visit: Payer: Managed Care, Other (non HMO) | Attending: Orthopedic Surgery | Admitting: Physical Therapy

## 2016-08-24 DIAGNOSIS — M25552 Pain in left hip: Secondary | ICD-10-CM

## 2016-08-24 DIAGNOSIS — R269 Unspecified abnormalities of gait and mobility: Secondary | ICD-10-CM

## 2016-08-24 DIAGNOSIS — M6281 Muscle weakness (generalized): Secondary | ICD-10-CM | POA: Diagnosis present

## 2016-08-25 NOTE — Therapy (Signed)
[facility] [address] [place], [place], [address] Phone: [phone]   Fax:  [phone]  Physical Therapy Treatment  Patient Details  Name: Duane Grant MRN: [id number] Date of Birth: Sep 07, 1955 Referring Provider: Dr. Mack Guise  Encounter Date: 08/24/2016      PT End of Session - 08/25/16 1739    Visit Number 1   Number of Visits 8   Date for PT Re-Evaluation 09/21/16   PT Start Time [id number]   PT Stop Time 1545   PT Time Calculation (min) 59 min   Activity Tolerance Patient tolerated treatment well      Past Medical History:  Diagnosis Date  . Arthritis   . Cancer [facility]) 2009   Prostate, radiation but no surgery  . Diabetes mellitus without complication ([place])   . Hypertension     Past Surgical History:  Procedure Laterality Date  . HERNIA REPAIR  [id number]   Umbilical  . Removal Fatty Tumor Right 2015   [facility], right side of chin/jaw  . TOTAL HIP ARTHROPLASTY Left 06/09/2016   Procedure: TOTAL HIP ARTHROPLASTY;  Surgeon: Thornton Park, MD;  Location: [facility] ORS;  Service: Orthopedics;  Laterality: Left;    There were no vitals filed for this visit.      Subjective Assessment - 08/25/16 1729    Subjective Pt. states he had a L THA on 06/09/16.  Pt. reports having PT prior to surgery and after surgery with [facility].  Pt. state he is getting better but not able to get off of [facility] and has continued pain in L hip.  Pt. reports 5/10 "sharp" L hip pain at worst and 3/10 pain currently.  Pt. states he has had no pain in L hip with certain positions/ rest.  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  Pt. understands hip precautions.     Pertinent History pt. wants an independent ex. program and has a small gym at [facility] complex.     Limitations Standing;Walking;House hold activities;Other (comment)   Patient Stated Goals Increase L  hip strength/ improve gait/ instruct in HEP.     Currently in Pain? Yes   Pain Score 3    Pain Location Hip   Pain Orientation Left            [facility] PT Assessment - 08/25/16 0001      Assessment   Medical Diagnosis L TKA   Referring Provider Dr. Mack Guise   Onset Date/Surgical Date 06/09/16   Prior Therapy yes, with [facility].       Precautions   Precautions Posterior Hip     Restrictions   Weight Bearing Restrictions No     Balance Screen   Has the patient fallen in the past 6 months No   Has the patient had a decrease in activity level because of a fear of falling?  Yes   Is the patient reluctant to leave their home because of a fear of falling?  No     Home Environment   Additional Comments 2nd floor apt.      See HEP.  Scifit L5 10 min. B UE/LE (no rest breaks).         PT Education - 08/25/16 1738    Education provided Yes   Education Details Reviewed HEP.  Issued standing hamstring stretch/ SLR/ bridging/ hip abd. with  RTB.     Person(s) Educated Patient   Methods Demonstration;Explanation;Handout   Comprehension Verbalized understanding;Returned demonstration;Verbal cues required             PT Long Term Goals - 08/25/16 1746      PT LONG TERM GOAL #1   Title Pt. independent with HEP to increase L hip strength to grossly 5/5 MMT to improve gait/ pain-free mobility with ex. program.     Baseline R/L hamstring length: 74/62 deg.  R LE muscle strength grossly 5/5 MMT.  L hip flexion 4+/5 MMT, quad 4-/5, hamstring 4+/5, abd. 4+/5 MMT.     Time 4   Period Weeks   Status New     PT LONG TERM GOAL #2   Title Pt. will increase LEFS to >50 out of 80 to improve functional mobility.   Baseline LEFS on 08/24/16 is 36 out of 80.     Time 4   Period Weeks   Status New     PT LONG TERM GOAL #3   Title Pt. able to ambulate with normalized gait pattern with no assistive community distances safely.     Baseline L antalgic gait limited L hip/knee flexion.   Requires use of SPC.     Time 4   Period Weeks   Status New     PT LONG TERM GOAL #4   Title Pt. able to progress to gym based ex. program with no c/o L hip pain to improve functional mobility.    Baseline pt. hoping to return to gym based program.     Time 4   Period Weeks   Status New            Plan - 08/25/16 1740    Clinical Impression Statement Pt. is a pleasant 61 y/o male s/p L THA on 06/09/16.  Pt. reports 3/10 L hip pain currently at rest and >5/10 at worst/ 0/10 at best.  Pt. presents with minimal to no tenderness over L hip region.  R/L hamstring length: 74/62 deg.  R LE muscle strength grossly 5/5 MMT.  L hip flexion 4+/5 MMT, quad 4-/5, hamstring 4+/5, abd. 4+/5 MMT.  LLD noted:  L (91.5 cm) and R (89.5 cm)- heel lift added to shoe with marked improvement in gait pattern/ standing posture symmetry.  LEFS: 36 out of 80.  Pt. ambulates with L antalgic gait pattern with use of SPC and limited L hip/knee flexion with moderate hip weakness noted (no trendelenburg).  Pt. will benefit from skilled PT services to increase L hip strength to improve pain-free mobility/ gait pattern.     Rehab Potential Good   PT Frequency 2x / week   PT Duration 4 weeks   PT Treatment/Interventions ADLs/Self Care Home Management;Aquatic Therapy;Cryotherapy;Moist Heat;Gait training;Stair training;Functional mobility training;Therapeutic activities;Therapeutic exercise;Balance training;Patient/family education;Neuromuscular re-education;Manual techniques;Passive range of motion   PT Next Visit Plan Progress HEP.  Reassess gait and use of heel lift to offset LLD.     PT Home Exercise Plan see handouts   Consulted and Agree with Plan of Care Patient      Patient will benefit from skilled therapeutic intervention in order to improve the following deficits and impairments:  Abnormal gait, Improper body mechanics, Pain, Postural dysfunction, Decreased mobility, Decreased coordination, Cardiopulmonary  status limiting activity, Decreased activity tolerance, Decreased endurance, Decreased range of motion, Decreased strength, Impaired flexibility, Difficulty walking, Decreased balance  Visit Diagnosis: Pain in left hip  Muscle weakness (generalized)  Gait difficulty  Problem List Patient Active Problem List   Diagnosis Date Noted  . Status post total hip replacement, left 06/09/2016   Pura Spice, PT, DPT # [phone] 08/25/2016, 5:50 PM  [facility] [address] [place], [place], [address] Phone: [phone]   Fax:  [phone]  Name: Duane Grant MRN: [id number] Date of Birth: [birth year]

## 2016-09-01 ENCOUNTER — Encounter: Payer: Managed Care, Other (non HMO) | Admitting: Physical Therapy

## 2016-09-07 ENCOUNTER — Ambulatory Visit: Payer: Managed Care, Other (non HMO) | Admitting: Physical Therapy

## 2016-09-07 DIAGNOSIS — R269 Unspecified abnormalities of gait and mobility: Secondary | ICD-10-CM

## 2016-09-07 DIAGNOSIS — M6281 Muscle weakness (generalized): Secondary | ICD-10-CM

## 2016-09-07 DIAGNOSIS — M25552 Pain in left hip: Secondary | ICD-10-CM | POA: Diagnosis not present

## 2016-09-07 NOTE — Therapy (Signed)
[facility] [address] [place], [place], [address] Phone: [phone]   Fax:  [phone]  Physical Therapy Treatment  Patient Details  Name: Duane Grant MRN: [id number] Date of Birth: 1956/01/07 Referring Provider: Dr. Mack Guise  Encounter Date: 09/07/2016      PT End of Session - 09/07/16 1612    Visit Number 2   Number of Visits 8   Date for PT Re-Evaluation 09/21/16   PT Start Time [id number]   PT Stop Time 1602   PT Time Calculation (min) 56 min   Activity Tolerance Patient tolerated treatment well   Behavior During Therapy [facility] for tasks assessed/performed      Past Medical History:  Diagnosis Date  . Arthritis   . Cancer [facility]) 2009   Prostate, radiation but no surgery  . Diabetes mellitus without complication ([place])   . Hypertension     Past Surgical History:  Procedure Laterality Date  . HERNIA REPAIR  [id number]   Umbilical  . Removal Fatty Tumor Right 2015   [facility], right side of chin/jaw  . TOTAL HIP ARTHROPLASTY Left 06/09/2016   Procedure: TOTAL HIP ARTHROPLASTY;  Surgeon: Thornton Park, MD;  Location: [facility] ORS;  Service: Orthopedics;  Laterality: Left;    There were no vitals filed for this visit.      Subjective Assessment - 09/07/16 1609    Subjective Pt. reports he is doing well today and has 2/10 pain in left hip. Pt says he has been compliant with HEP and staying as active as he can.    Pertinent History pt. wants an independent ex. program and has a small gym at [employer] [facility]. complex.     Limitations Standing;Walking;House hold activities;Other (comment)   Patient Stated Goals Increase L hip strength/ improve gait/ instruct in HEP.     Currently in Pain? Yes   Pain Score 2    Pain Location Hip   Pain Orientation Left      Therex:  GTB monster walks - Pt. Able to achieve more ROM with RLE during step out TG squats and heel-raises squats 20x2 - good knee alignment with cuing Standing  hip abd/ext 10x2 R/L - Pt. Instructed to keep toes pointed forward and put RTB around knees Fire hydrants in prone on blue table (<90 deg. Hip flex.) 10x R/L Scifit L8 10 min B UE/LE  Neuro: Star cone taps - L/R LE Single leg stance - no UE assist. Decreased balance time on L comp. To R (~6 sec. Vs. 15 sec.) //-bar (no UE assist) Partial squats on airex - 10x - pt. Cued for proper knee alignment (no knees past toes)     Pt response for medical necessity:  Benefits from B LE muscle strengthening program with progression of HEP to improve normalized gait/ RTW.  No increase c/o pain.         PT Education - 09/07/16 1611    Education provided Yes   Education Details Updated HEP. see handout   Person(s) Educated Patient   Methods Explanation;Demonstration;Handout   Comprehension Verbalized understanding;Returned demonstration             PT Long Term Goals - 08/25/16 1746      PT LONG TERM GOAL #1   Title Pt. independent with HEP to increase L hip strength to grossly 5/5 MMT to improve gait/ pain-free mobility with ex. program.     Baseline R/L hamstring length: 74/62 deg.  R LE muscle  strength grossly 5/5 MMT.  L hip flexion 4+/5 MMT, quad 4-/5, hamstring 4+/5, abd. 4+/5 MMT.     Time 4   Period Weeks   Status New     PT LONG TERM GOAL #2   Title Pt. will increase LEFS to >50 out of 80 to improve functional mobility.   Baseline LEFS on 08/24/16 is 36 out of 80.     Time 4   Period Weeks   Status New     PT LONG TERM GOAL #3   Title Pt. able to ambulate with normalized gait pattern with no assistive community distances safely.     Baseline L antalgic gait limited L hip/knee flexion.  Requires use of SPC.     Time 4   Period Weeks   Status New     PT LONG TERM GOAL #4   Title Pt. able to progress to gym based ex. program with no c/o L hip pain to improve functional mobility.    Baseline pt. hoping to return to gym based program.     Time 4   Period Weeks   Status New              Plan - 09/07/16 1749    Clinical Impression Statement Pt. presented to the clinic with [facility] but has been using it sparingly. Pt. able to demonstrate good LE form and posture with standing hip exercises (abd, squatting on airex). Slight ER of LLE during squats on [employer] but corrected with cuing.  Pt. ambulating with improved L hip/knee flexion and no cuing req. for posture. HEP updated w/ new LE strength exercises. Pt will progress to functional tasks for RTW next week.   Rehab Potential Good   PT Frequency 2x / week   PT Duration 4 weeks   PT Treatment/Interventions ADLs/Self Care Home Management;Aquatic Therapy;Cryotherapy;Moist Heat;Gait training;Stair training;Functional mobility training;Therapeutic activities;Therapeutic exercise;Balance training;Patient/family education;Neuromuscular re-education;Manual techniques;Passive range of motion   PT Next Visit Plan Progress HEP.  Reassess gait and use of heel lift to offset LLD.     PT Home Exercise Plan see handouts   Consulted and Agree with Plan of Care Patient      Patient will benefit from skilled therapeutic intervention in order to improve the following deficits and impairments:  Abnormal gait, Improper body mechanics, Pain, Postural dysfunction, Decreased mobility, Decreased coordination, Cardiopulmonary status limiting activity, Decreased activity tolerance, Decreased endurance, Decreased range of motion, Decreased strength, Impaired flexibility, Difficulty walking, Decreased balance  Visit Diagnosis: Pain in left hip  Muscle weakness (generalized)  Gait difficulty     Problem List Patient Active Problem List   Diagnosis Date Noted  . Status post total hip replacement, left 06/09/2016   Pura Spice, PT, DPT # [id number] SPT 09/08/2016, 12:29 PM  [facility] [address] [place], [place], [address] Phone: [phone]   Fax:   [phone]  Name: Duane Grant MRN: [id number] Date of Birth: Jun 26, 1956

## 2016-09-12 ENCOUNTER — Ambulatory Visit: Payer: Managed Care, Other (non HMO) | Admitting: Physical Therapy

## 2016-09-12 DIAGNOSIS — R269 Unspecified abnormalities of gait and mobility: Secondary | ICD-10-CM

## 2016-09-12 DIAGNOSIS — M6281 Muscle weakness (generalized): Secondary | ICD-10-CM

## 2016-09-12 DIAGNOSIS — M25552 Pain in left hip: Secondary | ICD-10-CM

## 2016-09-12 NOTE — Therapy (Signed)
[facility] [address] [place], [place], [address] Phone: [phone]   Fax:  [phone]  Physical Therapy Evaluation  Patient Details  Name: Duane Grant MRN: [id number] Date of Birth: 21-Apr-1956 Referring Provider: Dr. Mack Guise  Encounter Date: 09/12/2016      PT End of Session - 09/12/16 1116    Visit Number 3   Number of Visits 8   Date for PT Re-Evaluation 09/21/16   PT Start Time 0817   PT Stop Time 0911   PT Time Calculation (min) 54 min   Activity Tolerance Patient tolerated treatment well   Behavior During Therapy [facility] for tasks assessed/performed      Past Medical History:  Diagnosis Date  . Arthritis   . Cancer [facility]) 2009   Prostate, radiation but no surgery  . Diabetes mellitus without complication ([place])   . Hypertension     Past Surgical History:  Procedure Laterality Date  . HERNIA REPAIR  [id number]   Umbilical  . Removal Fatty Tumor Right 2015   [facility], right side of chin/jaw  . TOTAL HIP ARTHROPLASTY Left 06/09/2016   Procedure: TOTAL HIP ARTHROPLASTY;  Surgeon: Thornton Park, MD;  Location: [facility] ORS;  Service: Orthopedics;  Laterality: Left;    There were no vitals filed for this visit.         Subjective Assessment - 09/12/16 1112    Subjective Pt. reports some joint stiffness in L hip today from rainy weather but said he had a good weekend. Pt. has been compliant with HEP.   Patient is accompained by: Family member   Pertinent History pt. wants an independent ex. program and has a small gym at [employer] apt. complex.     Limitations Standing;Walking;House hold activities;Other (comment)   Patient Stated Goals Increase L hip strength/ improve gait/ instruct in HEP.     Currently in Pain? Yes   Pain Score 2    Pain Location Hip   Pain Orientation Left   Pain Descriptors / Indicators Discomfort   Pain Type Acute pain       Therex:  SciFit L6 10 min. B UE/LE (no  charge) single leg forward and lateral stepping BOSU step-ups in //-bars R/L 10x each w/ 3 sec holds - Pt. Req. Single UE assist with slight LOB/limited ability to maintain COM 6" inch stair step-ups forward and lateral - verbal cues to maintain hip precautions during lateral climbing 12" & 15" step ups R/L - B UE support (15") Pt. Fatigued on L but able to complete 5 step-ups with improved control each time.   Neuro:  Eccentric step-downs (6" step) 10x R/L. Req. Verbal/visual cuing due to fear of L hip giving way due to weakness. Good patellar tracking/no valgus of R/L knee.    Pt. Response for medical necessity: Pt. Will continue to benefit from skilled PT to continue strengthening LLE for RTW (date unknown). Will continue to progress functional activities (step-ups/balance) next tx session.         PT Long Term Goals - 08/25/16 1746      PT LONG TERM GOAL #1   Title Pt. independent with HEP to increase L hip strength to grossly 5/5 MMT to improve gait/ pain-free mobility with ex. program.     Baseline R/L hamstring length: 74/62 deg.  R LE muscle strength grossly 5/5 MMT.  L hip flexion 4+/5 MMT, quad 4-/5, hamstring 4+/5, abd. 4+/5 MMT.     Time 4  Period Weeks   Status New     PT LONG TERM GOAL #2   Title Pt. will increase LEFS to >50 out of 80 to improve functional mobility.   Baseline LEFS on 08/24/16 is 36 out of 80.     Time 4   Period Weeks   Status New     PT LONG TERM GOAL #3   Title Pt. able to ambulate with normalized gait pattern with no assistive community distances safely.     Baseline L antalgic gait limited L hip/knee flexion.  Requires use of SPC.     Time 4   Period Weeks   Status New     PT LONG TERM GOAL #4   Title Pt. able to progress to gym based ex. program with no c/o L hip pain to improve functional mobility.    Baseline pt. hoping to return to gym based program.     Time 4   Period Weeks   Status New            Plan - 09/12/16 1117     Clinical Impression Statement Pt. demonstrated good progress with functional abilities. Step ups and lateral step ups with 6 inch steps was performed with good body mechanics leading to a progression to 12 inch steps and 15 inch. Pt required verbal cueing to perform eccentric step downs due to fear of left leg weakness. HEP  was updated with steps/side steps to progress towards functional goals of returning to work. Pt would benefit from continued skilled therapy to progress generalized strengthening, balance, and functional return to daily living.    Rehab Potential Good   PT Frequency 2x / week   PT Duration 4 weeks   PT Treatment/Interventions ADLs/Self Care Home Management;Aquatic Therapy;Cryotherapy;Moist Heat;Gait training;Stair training;Functional mobility training;Therapeutic activities;Therapeutic exercise;Balance training;Patient/family education;Neuromuscular re-education;Manual techniques;Passive range of motion   PT Next Visit Plan Progress HEP.  Reassess stairs/step-ups    PT Home Exercise Plan see handouts   Consulted and Agree with Plan of Care Patient;Family member/caregiver      Patient will benefit from skilled therapeutic intervention in order to improve the following deficits and impairments:  Abnormal gait, Improper body mechanics, Pain, Postural dysfunction, Decreased mobility, Decreased coordination, Cardiopulmonary status limiting activity, Decreased activity tolerance, Decreased endurance, Decreased range of motion, Decreased strength, Impaired flexibility, Difficulty walking, Decreased balance  Visit Diagnosis: Pain in left hip  Muscle weakness (generalized)  Gait difficulty     Problem List Patient Active Problem List   Diagnosis Date Noted  . Status post total hip replacement, left 06/09/2016   Pura Spice, PT, DPT # [id number] Willodean Rosenthal, SPT  09/13/2016, 8:35 AM  [facility] [address] [place], [place], [address] Phone: [phone]   Fax:  [phone]  Name: Duane Grant MRN: [id number] Date of Birth: 1956/04/03

## 2016-09-13 ENCOUNTER — Encounter: Payer: Managed Care, Other (non HMO) | Admitting: Physical Therapy

## 2016-09-13 ENCOUNTER — Encounter: Payer: Self-pay | Admitting: Physical Therapy

## 2016-09-14 ENCOUNTER — Encounter: Payer: Managed Care, Other (non HMO) | Admitting: Physical Therapy

## 2016-09-20 ENCOUNTER — Ambulatory Visit: Payer: Managed Care, Other (non HMO) | Admitting: Physical Therapy

## 2016-09-20 ENCOUNTER — Encounter: Payer: Managed Care, Other (non HMO) | Admitting: Physical Therapy

## 2016-09-21 ENCOUNTER — Ambulatory Visit: Payer: Managed Care, Other (non HMO) | Attending: Orthopedic Surgery | Admitting: Physical Therapy

## 2016-09-21 ENCOUNTER — Encounter: Payer: Self-pay | Admitting: Physical Therapy

## 2016-09-21 DIAGNOSIS — M25552 Pain in left hip: Secondary | ICD-10-CM | POA: Diagnosis present

## 2016-09-21 DIAGNOSIS — M6281 Muscle weakness (generalized): Secondary | ICD-10-CM | POA: Diagnosis present

## 2016-09-21 DIAGNOSIS — R269 Unspecified abnormalities of gait and mobility: Secondary | ICD-10-CM | POA: Insufficient documentation

## 2016-09-21 NOTE — Therapy (Signed)
[facility] [address] [place], [place], [address] Phone: [phone]   Fax:  [phone]  Physical Therapy Treatment  Patient Details  Name: Duane Grant MRN: [id number] Date of Birth: [birth year] Referring Provider: Dr. Mack Guise  Encounter Date: 09/21/2016      PT End of Session - 09/21/16 1202    Visit Number 4   Number of Visits 8   Date for PT Re-Evaluation 09/21/16   PT Start Time 0815   PT Stop Time 0918   PT Time Calculation (min) 63 min   Activity Tolerance Patient tolerated treatment well   Behavior During Therapy [facility] for tasks assessed/performed      Past Medical History:  Diagnosis Date  . Arthritis   . Cancer [facility]) 2009   Prostate, radiation but no surgery  . Diabetes mellitus without complication ([place])   . Hypertension     Past Surgical History:  Procedure Laterality Date  . HERNIA REPAIR  [id number]   Umbilical  . Removal Fatty Tumor Right 2015   [facility], right side of chin/jaw  . TOTAL HIP ARTHROPLASTY Left 06/09/2016   Procedure: TOTAL HIP ARTHROPLASTY;  Surgeon: Thornton Park, MD;  Location: [facility] ORS;  Service: Orthopedics;  Laterality: Left;    There were no vitals filed for this visit.      Subjective Assessment - 09/21/16 1153    Subjective Pt. reports some hip flexor tightness from the weather and has been doing his HEP   Pertinent History pt. wants an independent ex. program and has a small gym at [place] apt. complex.     Limitations Standing;Walking;House hold activities;Other (comment)   Patient Stated Goals Increase L hip strength/ improve gait/ instruct in HEP.     Pain Score 3    Pain Location Hip   Pain Orientation Left   Pain Descriptors / Indicators Discomfort   Pain Type Acute pain     SEE NEW PT ORDER  There Ex: SciFit L6 10 min B UE/LE (no charge) 6 " stair step ups forward and lateral 4x-verbal cues to maintain hip precautions in lateral step ups 12 and 15"  step ups R/L with BUE support. 12"x10, 15"x 8 with cues for core activation and breathing Supine bridging x10 Nautilus: forward, L side step, backward, R side step resisted walks x4 each direction. 95lb backwards, 70-80 lbs sideways, 70lbs forwards. Cues for step length and speed Standing hip abduction with RTB x10 with no UE support Partial squat with 3-5 second holds x 10. Cues for breathing and body mechanics.    Neuro-Re ed: Eccentric Step downs (6" step) x 10 left leg. Required verbal cues for patellar tracking Heel taps in forward, lateral and sideways direction without UE support x 10 each leg.     Pt. Response for medical necessity: Pt. Will continue to benefit from skilled PT to continue strengthening LLE for RTW (date unknown). Will continue to progress functional activities (step-ups/balance) next tx session.         PT Long Term Goals - 08/25/16 1746      PT LONG TERM GOAL #1   Title Pt. independent with HEP to increase L hip strength to grossly 5/5 MMT to improve gait/ pain-free mobility with ex. program.     Baseline R/L hamstring length: 74/62 deg.  R LE muscle strength grossly 5/5 MMT.  L hip flexion 4+/5 MMT, quad 4-/5, hamstring 4+/5, abd. 4+/5 MMT.     Time 4  Period Weeks   Status New     PT LONG TERM GOAL #2   Title Pt. will increase LEFS to >50 out of 80 to improve functional mobility.   Baseline LEFS on 08/24/16 is 36 out of 80.     Time 4   Period Weeks   Status New     PT LONG TERM GOAL #3   Title Pt. able to ambulate with normalized gait pattern with no assistive community distances safely.     Baseline L antalgic gait limited L hip/knee flexion.  Requires use of SPC.     Time 4   Period Weeks   Status New     PT LONG TERM GOAL #4   Title Pt. able to progress to gym based ex. program with no c/o L hip pain to improve functional mobility.    Baseline pt. hoping to return to gym based program.     Time 4   Period Weeks   Status New             Plan - 09/21/16 [id number]    Clinical Impression Statement Patient presented to physical therapy session with more normalized gait pattern. Patient has been compliant with HEP and demonstrated competence with old HEP program. HEP has been progressed and patient demonstrated understanding. The patient has a preference to perform exercises fast and had to be cued multiple times throughout session for speed with the cue of "slow and steady". Patient's step negotiation has improved with 6,12,and 15 inch step. Patient requires cues for core activation and breathing throughout exercises and had noted improvement when complying. Patient continues to progress in strength and functional capacities and will benefit from continued skilled physical therapy for strengthening, balance, and functional return to activities of daily living.      Rehab Potential Good   PT Frequency 2x / week   PT Duration 4 weeks   PT Treatment/Interventions ADLs/Self Care Home Management;Aquatic Therapy;Cryotherapy;Moist Heat;Gait training;Stair training;Functional mobility training;Therapeutic activities;Therapeutic exercise;Balance training;Patient/family education;Neuromuscular re-education;Manual techniques;Passive range of motion   PT Next Visit Plan check new HEP,  RECERT next tx. session   PT Home Exercise Plan see handouts   Consulted and Agree with Plan of Care Patient;Family member/caregiver      Patient will benefit from skilled therapeutic intervention in order to improve the following deficits and impairments:  Abnormal gait, Improper body mechanics, Pain, Postural dysfunction, Decreased mobility, Decreased coordination, Cardiopulmonary status limiting activity, Decreased activity tolerance, Decreased endurance, Decreased range of motion, Decreased strength, Impaired flexibility, Difficulty walking, Decreased balance  Visit Diagnosis: Pain in left hip  Muscle weakness (generalized)  Gait difficulty     Problem  List Patient Active Problem List   Diagnosis Date Noted  . Status post total hip replacement, left 06/09/2016   Pura Spice, PT, DPT # [id number] Janna Arch, SPT 09/21/2016, 3:07 PM  [place] [facility] [address] [place], [place], [address] Phone: [phone]   Fax:  [phone]  Name: DELPHINE DEPIETRO MRN: [id number] Date of Birth: 10-17-1955

## 2016-09-26 ENCOUNTER — Encounter: Payer: Managed Care, Other (non HMO) | Admitting: Physical Therapy

## 2016-09-27 ENCOUNTER — Encounter: Payer: Managed Care, Other (non HMO) | Admitting: Physical Therapy

## 2016-09-29 ENCOUNTER — Ambulatory Visit: Payer: Managed Care, Other (non HMO) | Admitting: Physical Therapy

## 2016-09-29 DIAGNOSIS — M25552 Pain in left hip: Secondary | ICD-10-CM | POA: Diagnosis not present

## 2016-09-29 DIAGNOSIS — M6281 Muscle weakness (generalized): Secondary | ICD-10-CM

## 2016-09-29 DIAGNOSIS — R269 Unspecified abnormalities of gait and mobility: Secondary | ICD-10-CM

## 2016-09-29 NOTE — Therapy (Signed)
[facility] [place] [address] [place], [place], [address] Phone: [phone]   Fax:  [phone]  Physical Therapy Treatment  Patient Details  Name: Duane Grant MRN: [id number] Date of Birth: 04/02/1956 Referring Provider: Dr. Mack Guise  Encounter Date: 09/29/2016    Treatment 5 of 9.  End of Recert: 04/03/80  Past Medical History:  Diagnosis Date  . Arthritis   . Cancer [facility]) 2009   Prostate, radiation but no surgery  . Diabetes mellitus without complication ([place])   . Hypertension     Past Surgical History:  Procedure Laterality Date  . HERNIA REPAIR  [id number]   Umbilical  . Removal Fatty Tumor Right 2015   [facility], right side of chin/jaw  . TOTAL HIP ARTHROPLASTY Left 06/09/2016   Procedure: TOTAL HIP ARTHROPLASTY;  Surgeon: Thornton Park, MD;  Location: [facility] ORS;  Service: Orthopedics;  Laterality: Left;    There were no vitals filed for this visit.    Pt. reports no pain today and has been able to be active this past week. Pt has only been using SPC when he leaves the house as a precaution.   OBJECTIVE:  SciFit L6 10 min B UE/LE (no charge) 6 " stair step ups forward and lateral 4x-verbal cues to maintain hip precautions in lateral step ups TG knee flexion, single leg, and heel raises 20x R/L. Pt. Provided instruction to perform partial single leg squat on L side. L sided weakness but improving. Good knee alignment.  12 and 15" step ups R/L with BUE support. 12"x10, 15"x 8. Pt. Breathing well without cuing and and maintains strong core activation.  Nautilus: forward, L side step, backward, R side step resisted walks x4 each direction. 110lb backwards, 80 lbs sideways, 95lbs forwards w/ lunge at end. Cues for step length and speed. Pt. Able to perform with increased weight this week from last tx.    Neuro-Re ed: Single leg balance R/L, pt. Able to hold for 30 sec. Bilat. 5x Single leg rotations w/ medicine  ball 10x R/L. Pt. Able to control motion but will progress next week with speed and # of reps.     Pt. Response for medical necessity: Pt. Will continue to benefit from skilled PT to continue strengthening LLE for RTW (date unknown). Will continue to progress functional activities (step-ups/balance) next tx session.          PT Long Term Goals - 09/29/16 1158      PT LONG TERM GOAL #1   Title Pt. independent with HEP to increase L hip strength to grossly 5/5 MMT to improve gait/ pain-free mobility with ex. program.     Baseline R LE muscle strength grossly 5/5 MMT.  L hip flexion 4+/5 MMT, quad 4+/5, hamstring 4+/5, abd. 4+/5 MMT.     Time 4   Period Weeks   Status Partially Met     PT LONG TERM GOAL #2   Title Pt. will increase LEFS to >50 out of 80 to improve functional mobility.   Baseline LEFS on 09/29/16 is 50 out of 80.     Time 4   Period Weeks   Status Achieved     PT LONG TERM GOAL #3   Title Pt. able to ambulate with normalized gait pattern with no assistive community distances safely.     Baseline L gait limited L hip/knee flexion.  Requires use of SPC only in community   Time 4   Period Weeks  Status Partially Met     PT LONG TERM GOAL #4   Title Pt. able to progress to gym based ex. program with no c/o L hip pain to improve functional mobility.    Baseline Pt. has not yet begun home program in gym.    Time 4   Period Weeks   Status Not Met               Plan - 09/30/16 1204    Clinical Impression Statement Pt. presented to physical therapy today with no pain today and ambulating without use of SPC. Pt. is progressing well through functional strengthening of the L hip focusing on RTW tasks. Pt. is still limited with L hip and glute strength but able to complete all strengthening tasks with no increase in pain. Pt's LLE step up to 15" step improving requiring less UE assist of parallel bars and increased stability in L glute and quad. Pt. completed  resisted walking with Nautilus machine with increased weight from last tx session demonstrating increased core and glute control to maintain balance. Pt. is ambulating with cane only in the community but has been steadily decreasing use. Pt. has progressed toward a more normalized gait pattern showing symmetrical heelstrike and toe-off during gait. Pt. is more confidant ambulating with no device and is continuing to work hard. Pt. will benefit from continued skilled physical therapy to  Pt. Response for medical necessity: Pt. Will continue to benefit from skilled PT to continue strengthening LLE for RTW (date unknown).    Rehab Potential Good   PT Frequency 2x / week   PT Duration 4 weeks   PT Treatment/Interventions ADLs/Self Care Home Management;Aquatic Therapy;Cryotherapy;Moist Heat;Gait training;Stair training;Functional mobility training;Therapeutic activities;Therapeutic exercise;Balance training;Patient/family education;Neuromuscular re-education;Manual techniques;Passive range of motion   PT Next Visit Plan check new HEP,  RECERT next tx. session   PT Home Exercise Plan see handouts   Consulted and Agree with Plan of Care Patient;Family member/caregiver      Patient will benefit from skilled therapeutic intervention in order to improve the following deficits and impairments:  Abnormal gait, Improper body mechanics, Pain, Postural dysfunction, Decreased mobility, Decreased coordination, Cardiopulmonary status limiting activity, Decreased activity tolerance, Decreased endurance, Decreased range of motion, Decreased strength, Impaired flexibility, Difficulty walking, Decreased balance  Visit Diagnosis: Pain in left hip  Muscle weakness (generalized)  Gait difficulty     Problem List Patient Active Problem List   Diagnosis Date Noted  . Status post total hip replacement, left 06/09/2016   Pura Spice, PT, DPT # [id number] Willodean Rosenthal, SPT 09/30/2016, 5:20 PM  [facility] [address] [place], [place], [address] Phone: [phone]   Fax:  [phone]  Name: YAHEL FUSTON MRN: [id number] Date of Birth: [birth year]

## 2016-10-06 ENCOUNTER — Ambulatory Visit: Payer: Managed Care, Other (non HMO) | Admitting: Physical Therapy

## 2016-10-06 DIAGNOSIS — M25552 Pain in left hip: Secondary | ICD-10-CM | POA: Diagnosis not present

## 2016-10-06 DIAGNOSIS — M6281 Muscle weakness (generalized): Secondary | ICD-10-CM

## 2016-10-06 DIAGNOSIS — R269 Unspecified abnormalities of gait and mobility: Secondary | ICD-10-CM

## 2016-10-06 NOTE — Therapy (Signed)
[place] [facility] [address] [place], [place], [address] Phone: [phone]   Fax:  [phone]  Physical Therapy Treatment  Patient Details  Name: Duane Grant MRN: [id number] Date of Birth: 07-15-1956 Referring Provider: Dr. Mack Guise  Encounter Date: 10/06/2016      PT End of Session - 10/06/16 0726    Visit Number 6   Number of Visits 9   Date for PT Re-Evaluation 10/27/16   PT Start Time 0721   PT Stop Time 0820   PT Time Calculation (min) 59 min   Activity Tolerance Patient tolerated treatment well   Behavior During Therapy [facility] for tasks assessed/performed      Past Medical History:  Diagnosis Date  . Arthritis   . Cancer [facility]) 2009   Prostate, radiation but no surgery  . Diabetes mellitus without complication ([place])   . Hypertension     Past Surgical History:  Procedure Laterality Date  . HERNIA REPAIR  6440   Umbilical  . Removal Fatty Tumor Right 2015   [facility], right side of chin/jaw  . TOTAL HIP ARTHROPLASTY Left 06/09/2016   Procedure: TOTAL HIP ARTHROPLASTY;  Surgeon: Thornton Park, MD;  Location: [facility] ORS;  Service: Orthopedics;  Laterality: Left;    There were no vitals filed for this visit.      Subjective Assessment - 10/06/16 0724    Subjective Pt. reports he is tentatively returning to work 11/2016. Next visit to Dr. Mack Guise beginning of April as well. 3/10 L hip pain during activity   Pertinent History pt. wants an independent ex. program and has a small gym at [employer] apt. complex.     Limitations Walking;House hold activities;Other (comment);Lifting   Patient Stated Goals Increase L hip strength/ improve gait/ instruct in HEP.     Currently in Pain? Yes   Pain Score 3    Pain Location Hip   Pain Orientation Left   Pain Descriptors / Indicators Discomfort   Pain Type Acute pain      OBJECTIVE   There.Ex.: Nustep L6 B LE/UE 10 min (warm-up/no charge)  Ascend/descend 6"  stairs forward 10x, sideways leading with LLE 8x. Emphasis on slow, controlled descent with min. Use of hands on rails.  12" step-ups LLE leading w/ core activation and mod. UE  Eccentric step down's off 6" step w/ UE assist. 10x each leg. Cues for body mechanics Eccentric heel taps off 3" with limited UE assist 10x each leg.  Step-ups to 15" step 8x each leg. Cues for abdominal control Step ups 6" step with 4lb arm raises 10x each leg  Gait Training: Nautilus: forward, L side step, backward, R side step resisted walks x4 each direction.95lb backwards, 80 lbs sideways, 95lbs forwards Cues for step length and speed.  Ambulating ~300 ft with cues for speed and arm swing.    Pt. Ambulating independent without any assistive device with normalized gait pattern today. Pt.        PT Long Term Goals - 09/29/16 1158      PT LONG TERM GOAL #1   Title Pt. independent with HEP to increase L hip strength to grossly 5/5 MMT to improve gait/ pain-free mobility with ex. program.     Baseline R LE muscle strength grossly 5/5 MMT.  L hip flexion 4+/5 MMT, quad 4+/5, hamstring 4+/5, abd. 4+/5 MMT.     Time 4   Period Weeks   Status Partially Met     PT  LONG TERM GOAL #2   Title Pt. will increase LEFS to >50 out of 80 to improve functional mobility.   Baseline LEFS on 09/29/16 is 50 out of 80.     Time 4   Period Weeks   Status Achieved     PT LONG TERM GOAL #3   Title Pt. able to ambulate with normalized gait pattern with no assistive community distances safely.     Baseline L gait limited L hip/knee flexion.  Requires use of SPC only in community   Time 4   Period Weeks   Status Partially Met     PT LONG TERM GOAL #4   Title Pt. able to progress to gym based ex. program with no c/o L hip pain to improve functional mobility.    Baseline Pt. has not yet begun home program in gym.    Time 4   Period Weeks   Status Not Met            Plan - 10/06/16 0735    Clinical Impression  Statement Patient is progressing through step up tasks with focus on strengthening L hip and RTW conditions. Patient's body mechanics with higher steps is improving but still limited by gluteal and hip strength.  Breathing and cues for proper posture allowed patient to perform challenging tasks without pain. Patient was educated on safe walking speeds and encouraged to ambulate more frequently between sessions. Patient was also educated on listening to his body to not overdo tasks. Patient had good control with resisted walking and has improved gait mechanics with no noticeable gait deviations. Patient will benefit from skilled physical therapy to strengthen LLE for RTW.    Rehab Potential Good   PT Frequency 2x / week   PT Duration 4 weeks   PT Treatment/Interventions ADLs/Self Care Home Management;Aquatic Therapy;Cryotherapy;Moist Heat;Gait training;Stair training;Functional mobility training;Therapeutic activities;Therapeutic exercise;Balance training;Patient/family education;Neuromuscular re-education;Manual techniques;Passive range of motion   PT Next Visit Plan weighted side step ups   PT Home Exercise Plan see handouts   Consulted and Agree with Plan of Care Patient;Family member/caregiver      Patient will benefit from skilled therapeutic intervention in order to improve the following deficits and impairments:  Abnormal gait, Improper body mechanics, Pain, Postural dysfunction, Decreased mobility, Decreased coordination, Cardiopulmonary status limiting activity, Decreased activity tolerance, Decreased endurance, Decreased range of motion, Decreased strength, Impaired flexibility, Difficulty walking, Decreased balance  Visit Diagnosis: Pain in left hip  Muscle weakness (generalized)  Gait difficulty     Problem List Patient Active Problem List   Diagnosis Date Noted  . Status post total hip replacement, left 06/09/2016   Pura Spice, PT, DPT # [id number] Janna Arch,  SPT 10/06/2016, 9:28 AM  [place] [facility] [address] [place], [place], [address] Phone: [phone]   Fax:  [phone]  Name: IBAN UTZ MRN: [id number] Date of Birth: [birth year]

## 2016-10-13 ENCOUNTER — Ambulatory Visit: Payer: Managed Care, Other (non HMO) | Attending: Orthopedic Surgery | Admitting: Physical Therapy

## 2016-10-13 DIAGNOSIS — M6281 Muscle weakness (generalized): Secondary | ICD-10-CM | POA: Insufficient documentation

## 2016-10-13 DIAGNOSIS — R269 Unspecified abnormalities of gait and mobility: Secondary | ICD-10-CM | POA: Diagnosis present

## 2016-10-13 DIAGNOSIS — M25552 Pain in left hip: Secondary | ICD-10-CM | POA: Insufficient documentation

## 2016-10-13 NOTE — Therapy (Signed)
[place] [facility] [address] [place], [place], [address] Phone: [phone]   Fax:  [phone]  Physical Therapy Treatment  Patient Details  Name: Duane Grant MRN: [id number] Date of Birth: [birth year] Referring Provider: Dr. Mack Guise  Encounter Date: 10/13/2016      PT End of Session - 10/13/16 1211    Visit Number 7   Number of Visits 9   Date for PT Re-Evaluation 10/27/16   PT Start Time 0718   PT Stop Time 0807   PT Time Calculation (min) 49 min   Activity Tolerance Patient tolerated treatment well   Behavior During Therapy [facility] for tasks assessed/performed      Past Medical History:  Diagnosis Date  . Arthritis   . Cancer [facility]) 2009   Prostate, radiation but no surgery  . Diabetes mellitus without complication ([place])   . Hypertension     Past Surgical History:  Procedure Laterality Date  . HERNIA REPAIR  2458   Umbilical  . Removal Fatty Tumor Right 2015   [facility], right side of chin/jaw  . TOTAL HIP ARTHROPLASTY Left 06/09/2016   Procedure: TOTAL HIP ARTHROPLASTY;  Surgeon: Thornton Park, MD;  Location: [facility] ORS;  Service: Orthopedics;  Laterality: Left;    There were no vitals filed for this visit.      Subjective Assessment - 10/13/16 0721    Subjective Patient has been walking more at home and doing stairs to practice form.    Pertinent History pt. wants an independent ex. program and has a small gym at [place] apt. complex.     Limitations Walking;House hold activities;Other (comment);Lifting   Patient Stated Goals Increase L hip strength/ improve gait/ instruct in HEP.     Currently in Pain? No/denies     SciFit 10 mins lvl 7 (5 forward, 5 back)   There.Ex.: Nustep L6 B LE/UE 10 min (warm-up/no charge)  12" step-ups LLE/RLE leading w/ core activation and mod. UE cues for using RUE to grasp on stairway  Step-ups to 15" step 10x each leg. Cues for abdominal control Side Step ups 6" step  with 5lb arm raises 10x each leg Ascend /descend stairs 3x.Side step ascend/descend 3x each side.  Emphasis on slow, controlled descent with min. Use of hands on rails.  Nautilus: forward, L side step, backward, R side step resisted walks x4 each direction.110 lb backwards, 110 lbs sideways, 95lbs forwards Cues for step length and speed.   Neuro Re ed Slow marching on Airex pad emphasizing single limb stance. 2x30 seconds Eccentric step down's off 6" step w/ UE assist. 10x each leg. Cues for body mechanics   Pt. Response for medical necessity: Pt. Will continue to benefit from skilled PT to continue strengthening LLE for RTW (date unknown). Will continue to progress functional activities (step-ups/balance) next tx session.           PT Long Term Goals - 09/29/16 1158      PT LONG TERM GOAL #1   Title Pt. independent with HEP to increase L hip strength to grossly 5/5 MMT to improve gait/ pain-free mobility with ex. program.     Baseline R LE muscle strength grossly 5/5 MMT.  L hip flexion 4+/5 MMT, quad 4+/5, hamstring 4+/5, abd. 4+/5 MMT.     Time 4   Period Weeks   Status Partially Met     PT LONG TERM GOAL #2   Title Pt. will increase LEFS to >50 out  of 80 to improve functional mobility.   Baseline LEFS on 09/29/16 is 50 out of 80.     Time 4   Period Weeks   Status Achieved     PT LONG TERM GOAL #3   Title Pt. able to ambulate with normalized gait pattern with no assistive community distances safely.     Baseline L gait limited L hip/knee flexion.  Requires use of SPC only in community   Time 4   Period Weeks   Status Partially Met     PT LONG TERM GOAL #4   Title Pt. able to progress to gym based ex. program with no c/o L hip pain to improve functional mobility.    Baseline Pt. has not yet begun home program in gym.    Time 4   Period Weeks   Status Not Met               Plan - 10/13/16 1216    Clinical Impression Statement Pt. presented to physical  therapy today with slight stiffness due to inclement weather. Pt. has implemented walking program discussed last session and has noted improved gait mechanics. Step up body mechanics and smoothness continues to progress with step heights and will continue to focus on gluteal and hip strength to progress for return to work.  Pt. requires cues for abdominal control in all tasks. BLE strength has progressed with Nautilus resisted walking increased weight with only one incidence LOB.  Single limb stance continues to be a challenge and requires cues for task orientation. Patient will benefit from skilled physical therapy to strengthen LLE for RTW.    Rehab Potential Good   PT Frequency 2x / week   PT Duration 4 weeks   PT Treatment/Interventions ADLs/Self Care Home Management;Aquatic Therapy;Cryotherapy;Moist Heat;Gait training;Stair training;Functional mobility training;Therapeutic activities;Therapeutic exercise;Balance training;Patient/family education;Neuromuscular re-education;Manual techniques;Passive range of motion   PT Next Visit Plan side steps, single limb balance,    PT Home Exercise Plan see handouts   Consulted and Agree with Plan of Care Patient;Family member/caregiver      Patient will benefit from skilled therapeutic intervention in order to improve the following deficits and impairments:  Abnormal gait, Improper body mechanics, Pain, Postural dysfunction, Decreased mobility, Decreased coordination, Cardiopulmonary status limiting activity, Decreased activity tolerance, Decreased endurance, Decreased range of motion, Decreased strength, Impaired flexibility, Difficulty walking, Decreased balance  Visit Diagnosis: Pain in left hip  Muscle weakness (generalized)  Gait difficulty     Problem List Patient Active Problem List   Diagnosis Date Noted  . Status post total hip replacement, left 06/09/2016   Pura Spice, PT, DPT # [id number] Janna Arch, SPT 10/14/2016, 2:30 PM  [facility] [address] [place], [place], [address] Phone: [phone]   Fax:  [phone]  Name: GUMECINDO HOPKIN MRN: [id number] Date of Birth: 1956/01/24

## 2016-10-14 ENCOUNTER — Encounter: Payer: Self-pay | Admitting: Physical Therapy

## 2016-10-20 ENCOUNTER — Encounter: Payer: Self-pay | Admitting: Physical Therapy

## 2016-10-20 ENCOUNTER — Ambulatory Visit: Payer: Managed Care, Other (non HMO) | Admitting: Physical Therapy

## 2016-10-20 DIAGNOSIS — M25552 Pain in left hip: Secondary | ICD-10-CM | POA: Diagnosis not present

## 2016-10-20 DIAGNOSIS — M6281 Muscle weakness (generalized): Secondary | ICD-10-CM

## 2016-10-20 DIAGNOSIS — R269 Unspecified abnormalities of gait and mobility: Secondary | ICD-10-CM

## 2016-10-20 NOTE — Therapy (Signed)
[place] [facility] [address]. [place], [place], [address] Phone: [phone]   Fax:  [phone]  Physical Therapy Treatment  Patient Details  Name: Duane Grant MRN: [id number] Date of Birth: 08/07/1956 Referring Provider: Dr. Martha Clan  Encounter Date: 10/20/2016      PT End of Session - 10/20/16 0726    Visit Number 8   Number of Visits 9   Date for PT Re-Evaluation 10/27/16   PT Start Time 0722   PT Stop Time 0819   PT Time Calculation (min) 57 min   Activity Tolerance Patient tolerated treatment well   Behavior During Therapy [facility] for tasks assessed/performed      Past Medical History:  Diagnosis Date  . Arthritis   . Cancer [facility]) 2009   Prostate, radiation but no surgery  . Diabetes mellitus without complication ([facility])   . Hypertension     Past Surgical History:  Procedure Laterality Date  . HERNIA REPAIR  2010   Umbilical  . Removal Fatty Tumor Right 2015   [facility], right side of chin/jaw  . TOTAL HIP ARTHROPLASTY Left 06/09/2016   Procedure: TOTAL HIP ARTHROPLASTY;  Surgeon: Juanell Fairly, MD;  Location: [facility] ORS;  Service: Orthopedics;  Laterality: Left;    There were no vitals filed for this visit.      Subjective Assessment - 10/20/16 0725    Subjective Pt. states he is doing better.  Pt. reports 1-2/10 L hip discomfort this morning.  Pt. states he is walking about a mile a day on treadmill.  Pt. has tried to use Elliptical but it is uncomfortable.     Pertinent History pt. wants an independent ex. program and has a small gym at [employer] apt. complex.     Limitations Walking;House hold activities;Other (comment);Lifting   Patient Stated Goals Increase L hip strength/ improve gait/ instruct in HEP.     Currently in Pain? No/denies   Pain Score 1    Pain Location Hip   Pain Orientation Left   Pain Descriptors / Indicators Discomfort      TM walking at 1.8-2.0 mph for 10 min. With min. To no UE  assist (warm-up/ no charge).     There.Ex.:   BOSU step ups on L with min. To no UE assist, R with no UE assist 15x each (good posture).   L BOSU step ups with increase R hip flexion and short holds 10x.   Reverse BOSU: wt. Shifting/ partial squats 25x (cuing for posture).   Nautilus: forward, L side step, backward, R side step resisted walks x4 each direction.125 lb backwards, 110 lbs sideways, 125 lbs forwards Cues for step length and speed.  12" step-ups LLE/RLE leading w/ core activation and mod. UE cues for using RUE to grasp on stairway  Step-ups to 15" step 10x each leg. Cues for abdominal control Side Step ups 6" step with 5lb arm raises 10x each leg Ascend /descend stairs 3x.Side step ascend/descend 3x each side.  Emphasis on slow, controlled descent with min. Use of hands on rails.   SciFit L8 10 min (5 min. forward, 5 min. back)   Pt. Response for medical necessity: Pt. Will continue to benefit from skilled PT to continue strengthening LLE for RTW (date unknown).  Pt. Ambulates with a normalized gait pattern t/o tx. Session.           PT Long Term Goals - 09/29/16 1158      PT LONG TERM  GOAL #1   Title Pt. independent with HEP to increase L hip strength to grossly 5/5 MMT to improve gait/ pain-free mobility with ex. program.     Baseline R LE muscle strength grossly 5/5 MMT.  L hip flexion 4+/5 MMT, quad 4+/5, hamstring 4+/5, abd. 4+/5 MMT.     Time 4   Period Weeks   Status Partially Met     PT LONG TERM GOAL #2   Title Pt. will increase LEFS to >50 out of 80 to improve functional mobility.   Baseline LEFS on 09/29/16 is 50 out of 80.     Time 4   Period Weeks   Status Achieved     PT LONG TERM GOAL #3   Title Pt. able to ambulate with normalized gait pattern with no assistive community distances safely.     Baseline L gait limited L hip/knee flexion.  Requires use of SPC only in community   Time 4   Period Weeks   Status Partially Met     PT LONG TERM  GOAL #4   Title Pt. able to progress to gym based ex. program with no c/o L hip pain to improve functional mobility.    Baseline Pt. has not yet begun home program in gym.    Time 4   Period Weeks   Status Not Met            Plan - 10/20/16 0726    Clinical Impression Statement Pt. ambulates into PT clinic with marked improvement in a more normalized gait pattern with no SPC.  Pt. carrying the [facility] out of PT clinic and instructed to limit use to outside/uneven terrain only.  Pt. progressing well with higher level BOSU ex./ stability tasks without pain and min. to no UE assist.  Pt. progressing well towards all goals at this time.     Rehab Potential Good   PT Frequency 2x / week   PT Duration 4 weeks   PT Treatment/Interventions ADLs/Self Care Home Management;Aquatic Therapy;Cryotherapy;Moist Heat;Gait training;Stair training;Functional mobility training;Therapeutic activities;Therapeutic exercise;Balance training;Patient/family education;Neuromuscular re-education;Manual techniques;Passive range of motion   PT Next Visit Plan REASSESS GOALS/ progress HEP    PT Home Exercise Plan see handouts   Consulted and Agree with Plan of Care Patient      Patient will benefit from skilled therapeutic intervention in order to improve the following deficits and impairments:  Abnormal gait, Improper body mechanics, Pain, Postural dysfunction, Decreased mobility, Decreased coordination, Cardiopulmonary status limiting activity, Decreased activity tolerance, Decreased endurance, Decreased range of motion, Decreased strength, Impaired flexibility, Difficulty walking, Decreased balance  Visit Diagnosis: Pain in left hip  Muscle weakness (generalized)  Gait difficulty     Problem List Patient Active Problem List   Diagnosis Date Noted  . Status post total hip replacement, left 06/09/2016   Pura Spice, PT, DPT # [id number] 10/21/2016, 1:51 PM  [facility] [address] [place], [place], [address] Phone: [phone]   Fax:  [phone]  Name: Duane Grant MRN: [id number] Date of Birth: [birth year]

## 2016-10-27 ENCOUNTER — Ambulatory Visit: Payer: Managed Care, Other (non HMO) | Admitting: Physical Therapy

## 2016-10-27 DIAGNOSIS — M6281 Muscle weakness (generalized): Secondary | ICD-10-CM

## 2016-10-27 DIAGNOSIS — R269 Unspecified abnormalities of gait and mobility: Secondary | ICD-10-CM

## 2016-10-27 DIAGNOSIS — M25552 Pain in left hip: Secondary | ICD-10-CM

## 2016-10-27 NOTE — Therapy (Signed)
[place] [facility] [address] [place], [place], [address] Phone: [phone]   Fax:  [phone]  Physical Therapy Treatment  Patient Details  Name: Duane Grant MRN: [id number] Date of Birth: 1956-07-29 Referring Provider: Dr. Mack Guise  Encounter Date: 10/27/2016      PT End of Session - 10/27/16 0845    Visit Number 9   Number of Visits 9   Date for PT Re-Evaluation 10/27/16   PT Start Time 0720   PT Stop Time 0818   PT Time Calculation (min) 58 min   Activity Tolerance Patient tolerated treatment well   Behavior During Therapy [facility] for tasks assessed/performed      Past Medical History:  Diagnosis Date  . Arthritis   . Cancer [facility]) 2009   Prostate, radiation but no surgery  . Diabetes mellitus without complication ([place])   . Hypertension     Past Surgical History:  Procedure Laterality Date  . HERNIA REPAIR  9024   Umbilical  . Removal Fatty Tumor Right 2015   [facility], right side of chin/jaw  . TOTAL HIP ARTHROPLASTY Left 06/09/2016   Procedure: TOTAL HIP ARTHROPLASTY;  Surgeon: Thornton Park, MD;  Location: [facility] ORS;  Service: Orthopedics;  Laterality: Left;    There were no vitals filed for this visit.      Subjective Assessment - 10/27/16 0720    Subjective Patient has continued to increase ambulation activities and sometimes forgets that he has to be careful due to how well he has been feeling.    Pertinent History pt. wants an independent ex. program and has a small gym at [employer] apt. complex.     Limitations Walking;House hold activities;Other (comment);Lifting   Patient Stated Goals Increase L hip strength/ improve gait/ instruct in HEP.     Currently in Pain? No/denies   Pain Score 0-No pain      TM walking at 1.8-2.0 mph for 10 min. With min. To no UE assist (warm-up/ no charge).    Neuro: Eccentric step downs 2x10 each leg with single UE assistance Single limb stance 30 sec each leg  2x with and without pillow Bosu ball reverse, normal, 1x60 second holds, squat 10x each position  TherEx: Squats with chair in front 10x, pause 3 minutes at bottom of each squat x10 Monster walk 8x 10 ft with RTB above knee , 4x10 ft with RTB above ankle Side step ups on staircase 4x  Each direction Ascend/descend staircase 3x 12" step ups 10x each leg Reviewed HEP in depth.     Ambulating between interventions with normal gait mechanics 6x40 ft  Patient response to medical necessity: educated on new HEP to progress towards more independent plan of care.         PT Long Term Goals - 10/27/16 0849      PT LONG TERM GOAL #1   Title Pt. independent with HEP to increase L hip strength to grossly 5/5 MMT to improve gait/ pain-free mobility with ex. program.     Baseline Gross 5/5, pain free durign exercises   Time 4   Period Weeks   Status Achieved     PT LONG TERM GOAL #2   Title Pt. will increase LEFS to >50 out of 80 to improve functional mobility.   Baseline 3/15: 67 , LEFS on 09/29/16 is 50 out of 80.     Time 4   Period Weeks   Status Achieved     PT  LONG TERM GOAL #3   Title Pt. able to ambulate with normalized gait pattern with no assistive community distances safely.     Baseline ambulate with no assitive device with normal gait pattern   Time 4   Period Weeks   Status Achieved     PT LONG TERM GOAL #4   Title Pt. able to progress to gym based ex. program with no c/o L hip pain to improve functional mobility.    Baseline Began walking program at home and independent with HEP's with no c/o of hip   Time 4   Period Weeks   Status Achieved            Plan - 10/27/16 0849    Clinical Impression Statement Patient presented to physical therapy with continued increased duration of ambulation at home. Gait mechanics are normal with no antalgia noted. Stair negotiation is safe with good core stabilization and reciprocal pattern. LLE strength is increasing with  progressions of HEP and abilities to perform gross motor activities. Dynamic balance continues to improve with dynamic surfaces such as Bosu ball. Single limb stance is steady with limited LOB. Pt. has demonstrated compliance with HEP and is ready to shift to a more independent program with a check in in a weeks' time.    Rehab Potential Good   PT Frequency 2x / week   PT Duration 4 weeks   PT Treatment/Interventions ADLs/Self Care Home Management;Aquatic Therapy;Cryotherapy;Moist Heat;Gait training;Stair training;Functional mobility training;Therapeutic activities;Therapeutic exercise;Balance training;Patient/family education;Neuromuscular re-education;Manual techniques;Passive range of motion   PT Next Visit Plan call in 1 week   PT Home Exercise Plan see handouts   Consulted and Agree with Plan of Care Patient      Patient will benefit from skilled therapeutic intervention in order to improve the following deficits and impairments:  Abnormal gait, Improper body mechanics, Pain, Postural dysfunction, Decreased mobility, Decreased coordination, Cardiopulmonary status limiting activity, Decreased activity tolerance, Decreased endurance, Decreased range of motion, Decreased strength, Impaired flexibility, Difficulty walking, Decreased balance  Visit Diagnosis: Pain in left hip  Muscle weakness (generalized)  Gait difficulty     Problem List Patient Active Problem List   Diagnosis Date Noted  . Status post total hip replacement, left 06/09/2016   Pura Spice, PT, DPT # [id number] Janna Arch, SPT 10/27/2016, 9:18 AM  [place] [facility] [address] [place], [place], [address] Phone: [phone]   Fax:  [phone]  Name: MARCELLIS FRAMPTON MRN: [id number] Date of Birth: 1955/11/20

## 2017-10-06 IMAGING — CR DG CHEST 2V
2 series · 2 of 2 positions shown · non-contrast
Comparison: No prior .

CLINICAL DATA: Hip surgery.

EXAM:
CHEST  2 VIEW

[chest pa]
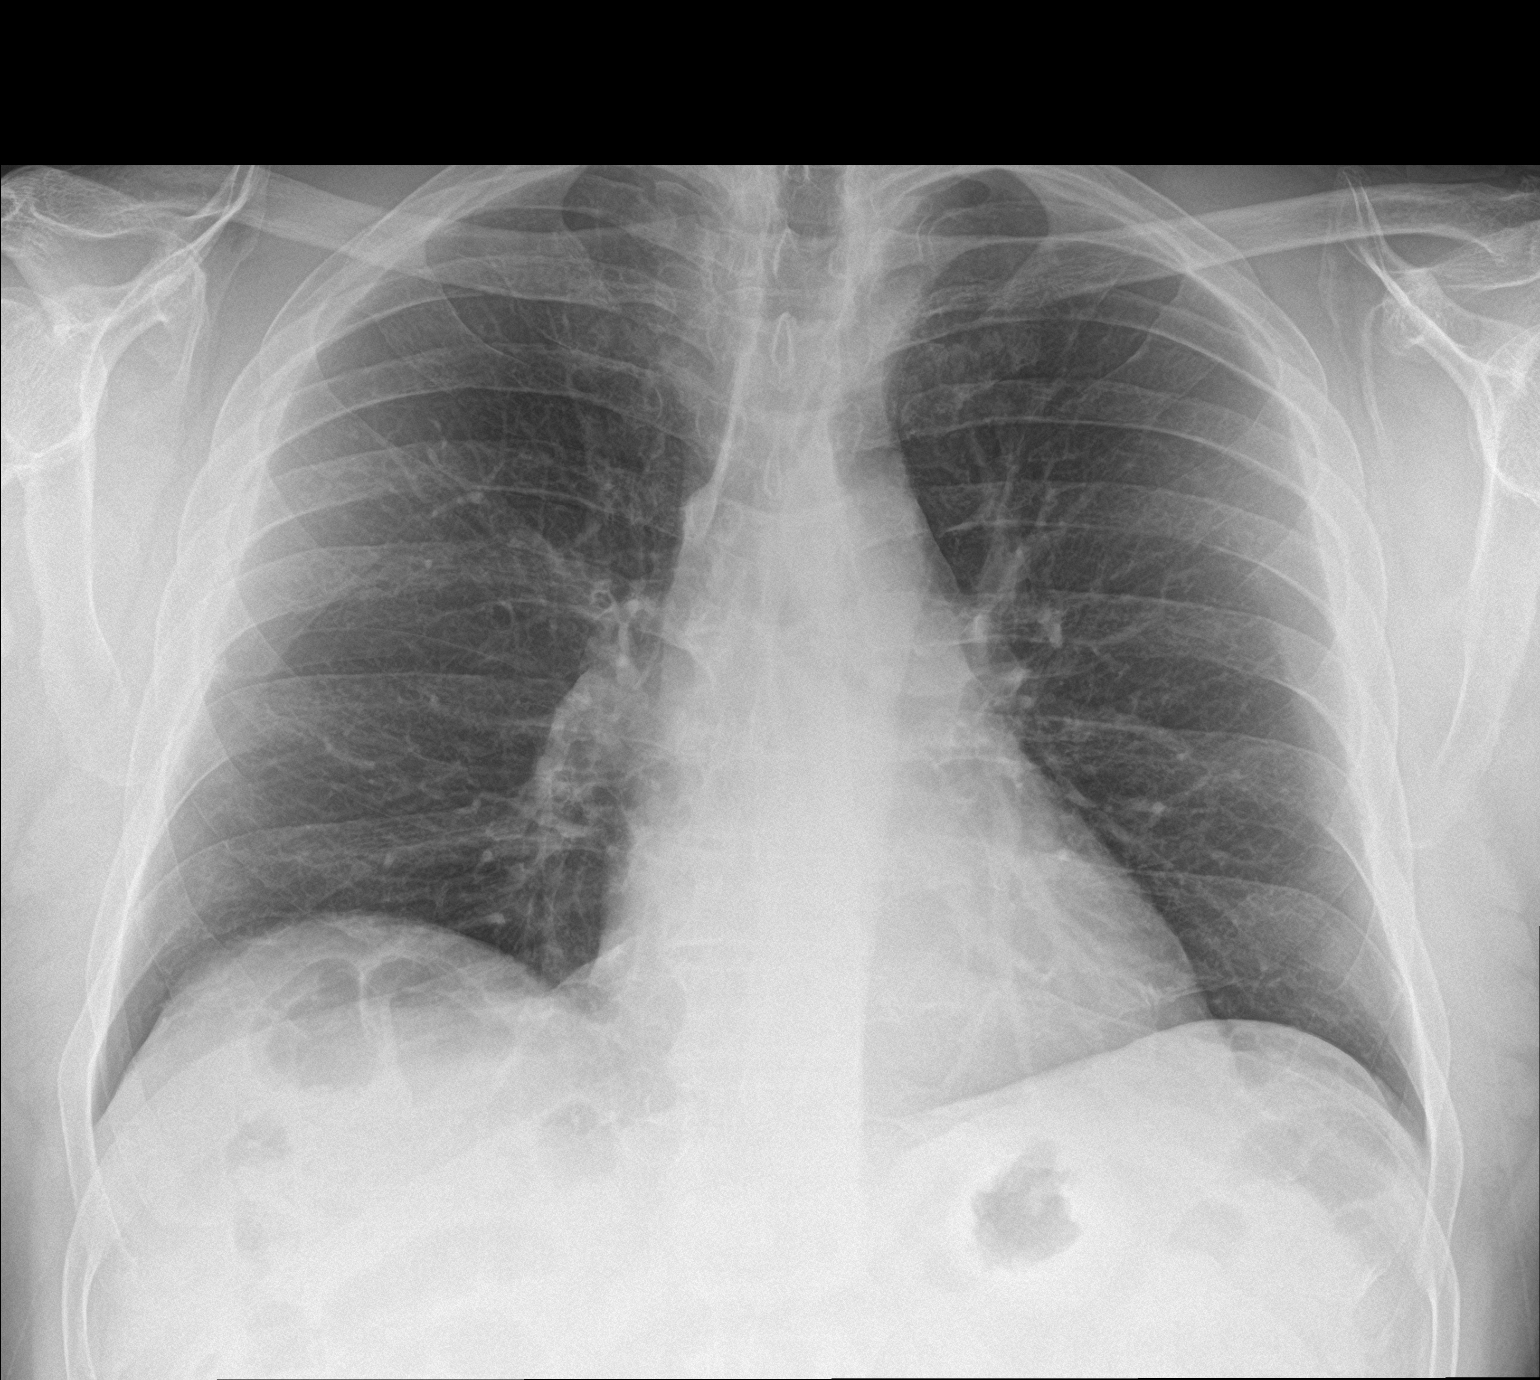

[chest lat]
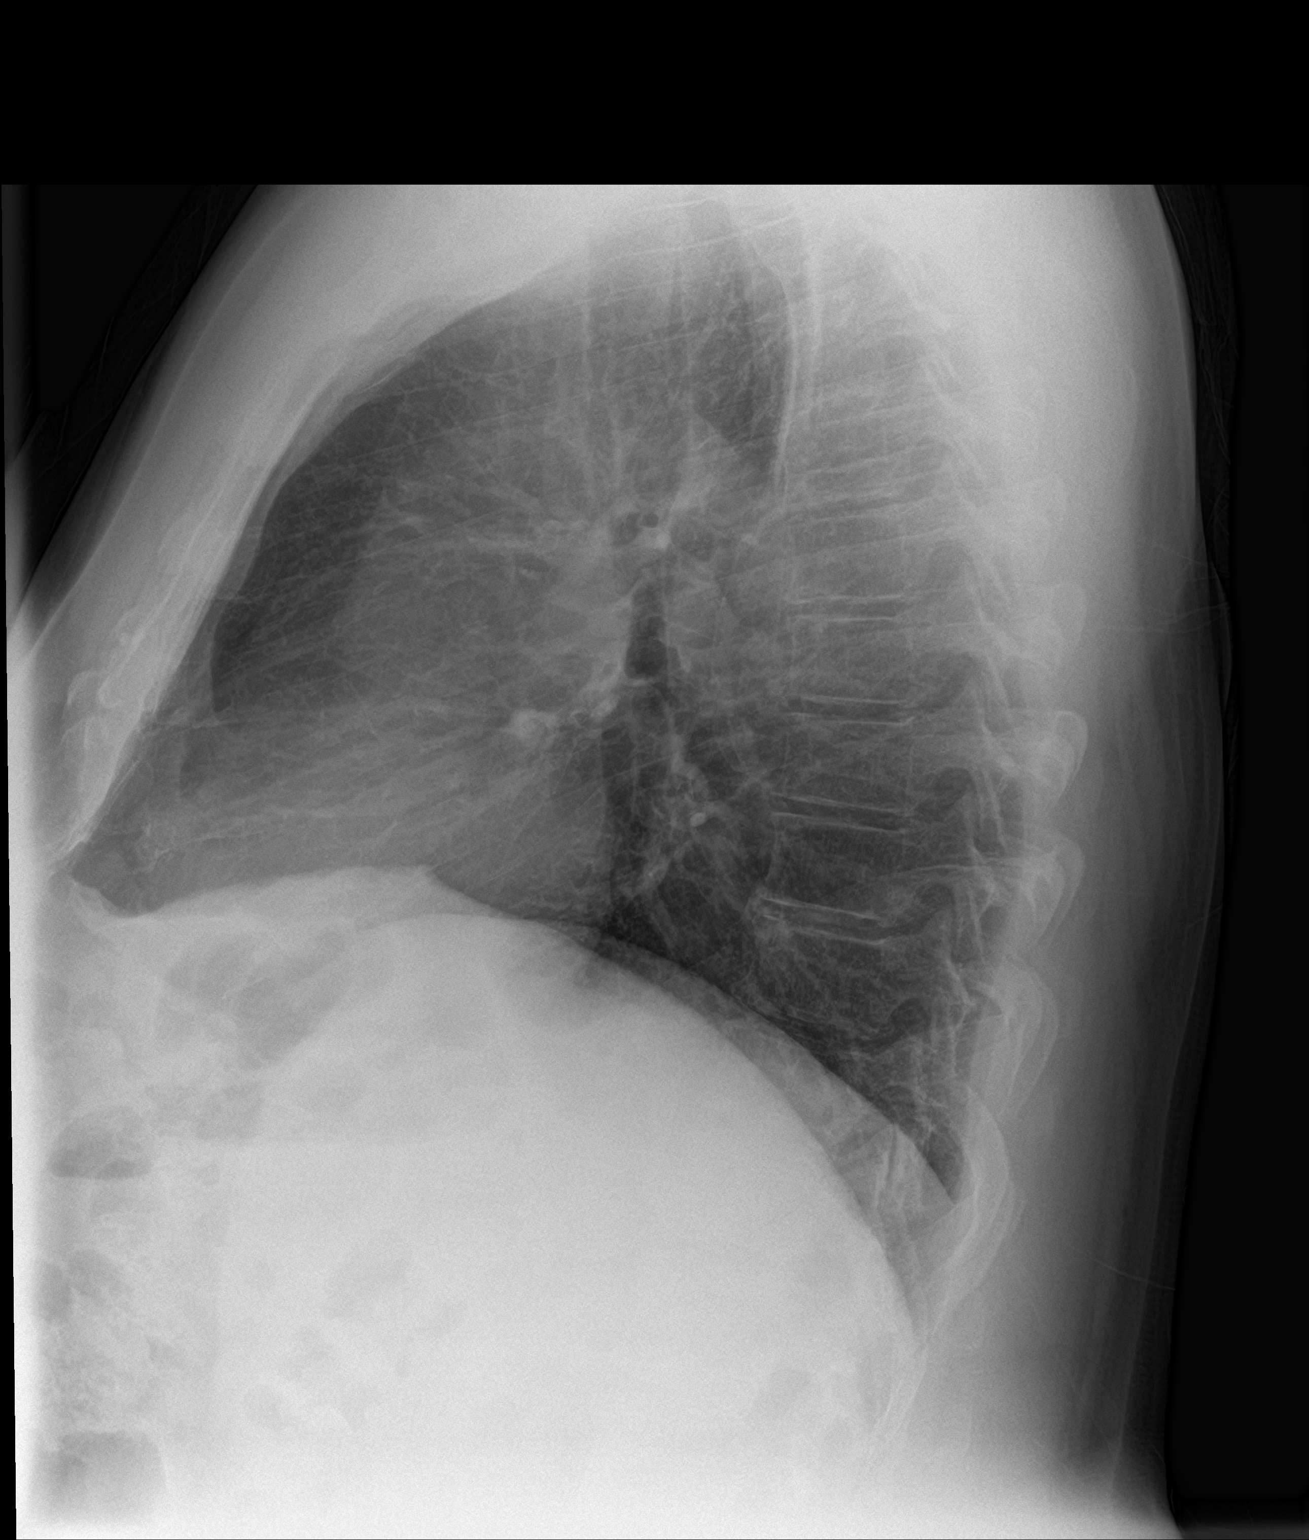

[2 of 2 positions shown; findings below may reference images not displayed]

FINDINGS: Mediastinum and hilar structures normal. Cardiomegaly with normal
pulmonary vascularity. No focal pulmonary infiltrate or pleural
effusion. Degenerative changes thoracic spine.
IMPRESSION: No acute cardiopulmonary disease.

## 2017-10-07 IMAGING — DX DG PORTABLE PELVIS
1 series · 1 of 1 positions shown · non-contrast
Comparison: None.

CLINICAL DATA: Left hip replacement

EXAM:
PORTABLE PELVIS 1-2 VIEWS

[pelvis ap]
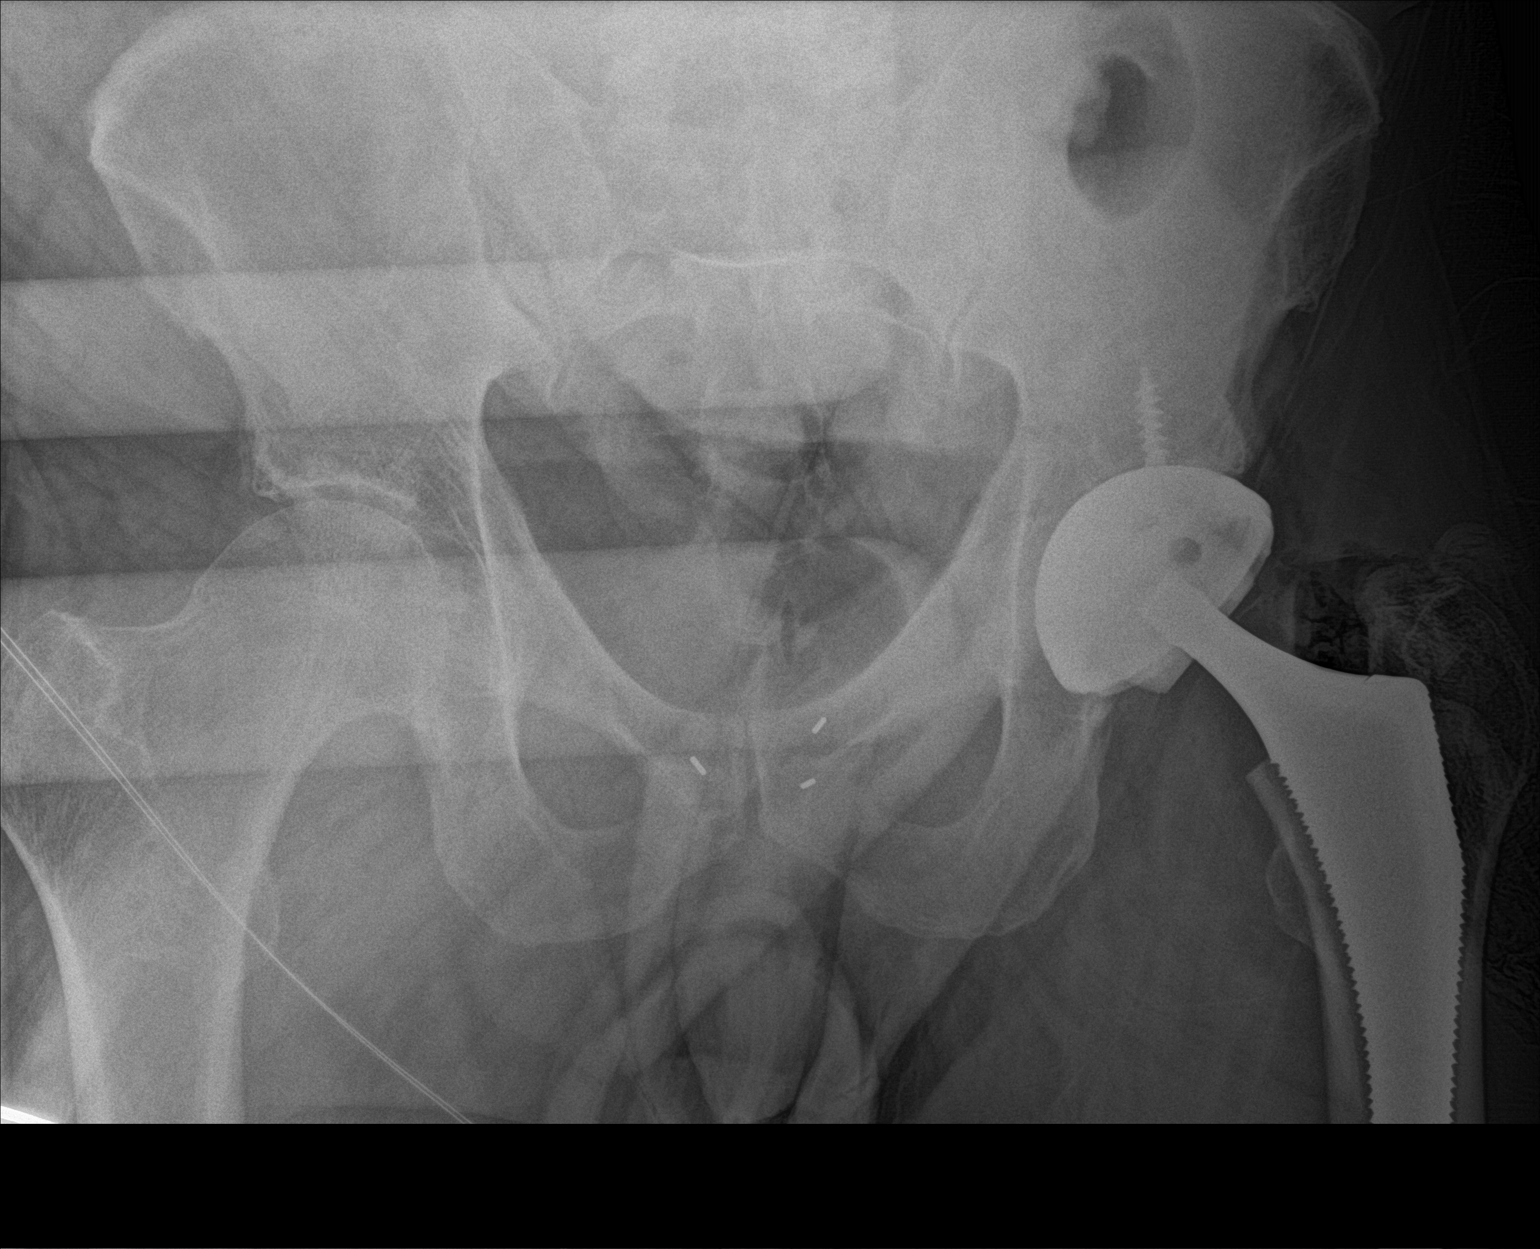

[1 of 1 positions shown; findings below may reference images not displayed]

FINDINGS: Left hip prosthesis is noted in satisfactory position. No acute bony
abnormality is seen. No soft tissue changes are noted.
IMPRESSION: Status post left hip replacement

## 2017-10-07 IMAGING — DX DG HIP (WITH OR WITHOUT PELVIS) 1V PORT*L*
2 series · 2 of 2 positions shown · non-contrast
Comparison: None.

CLINICAL DATA: Status post left hip replacement

EXAM:
DG HIP (WITH OR WITHOUT PELVIS) 2V PORT LEFT

[pelvis ap]
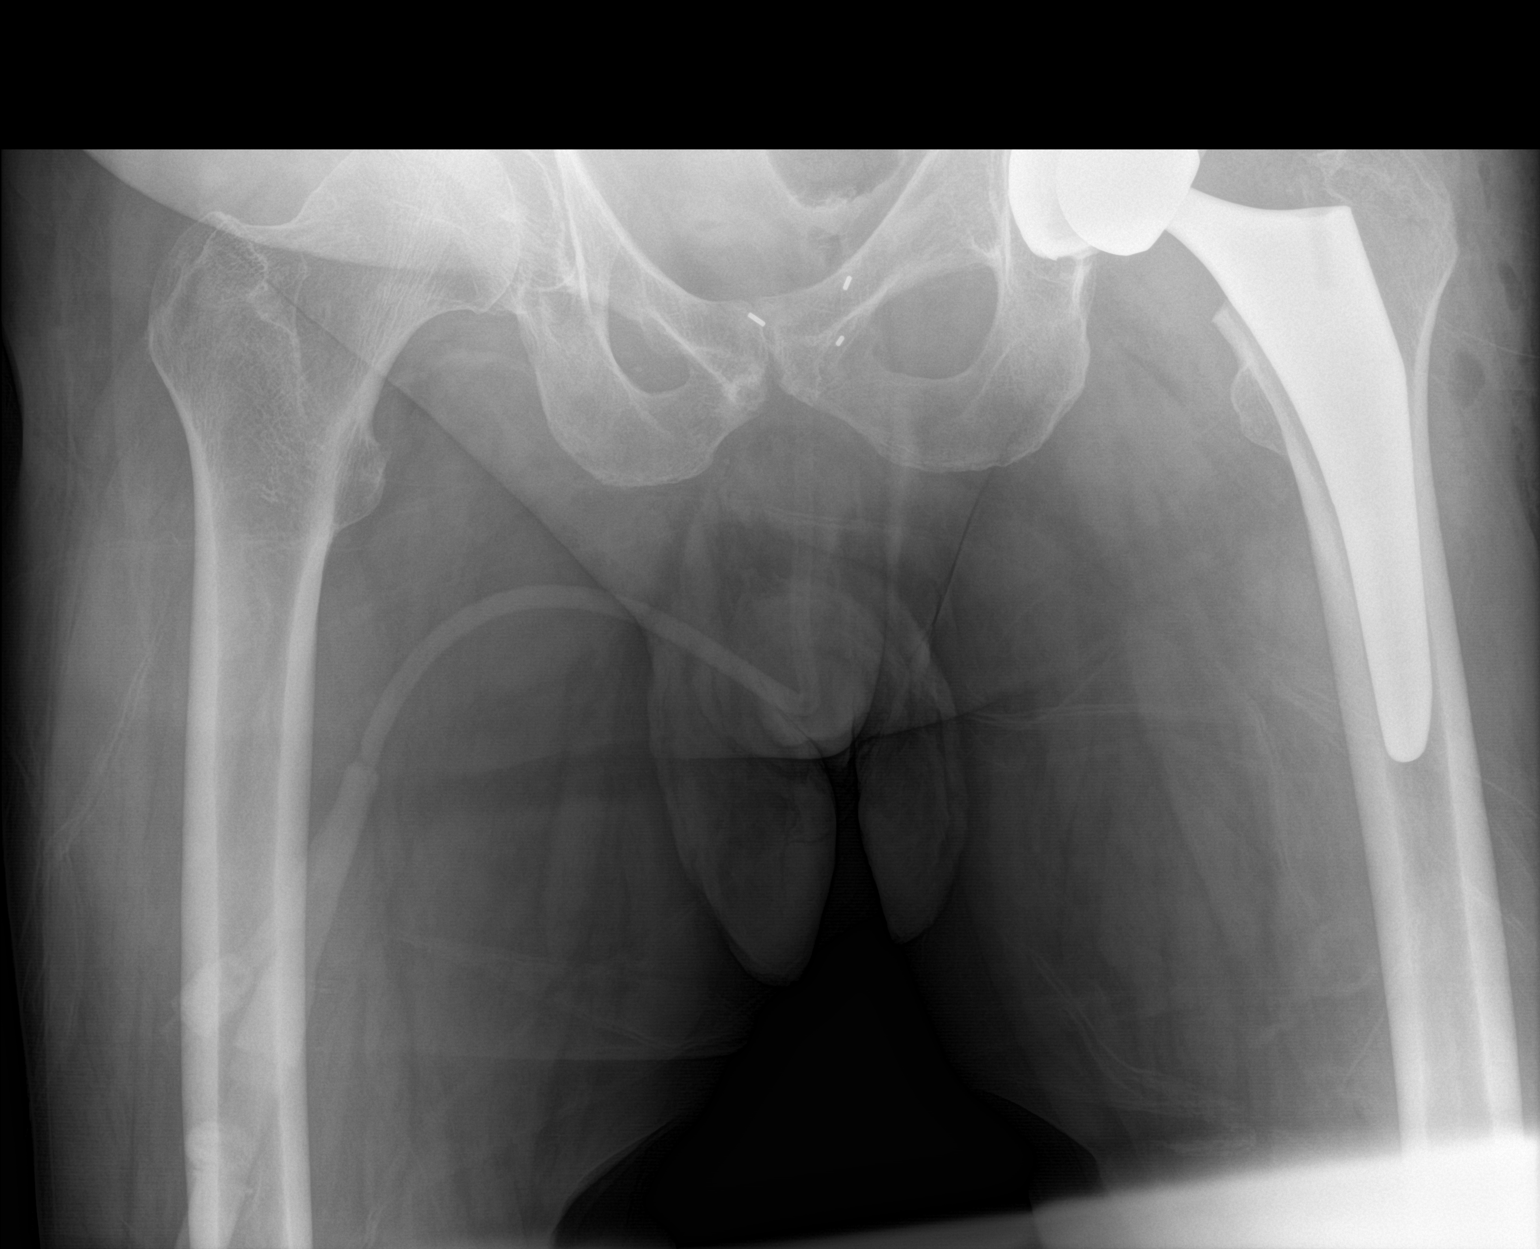

[hip lat]
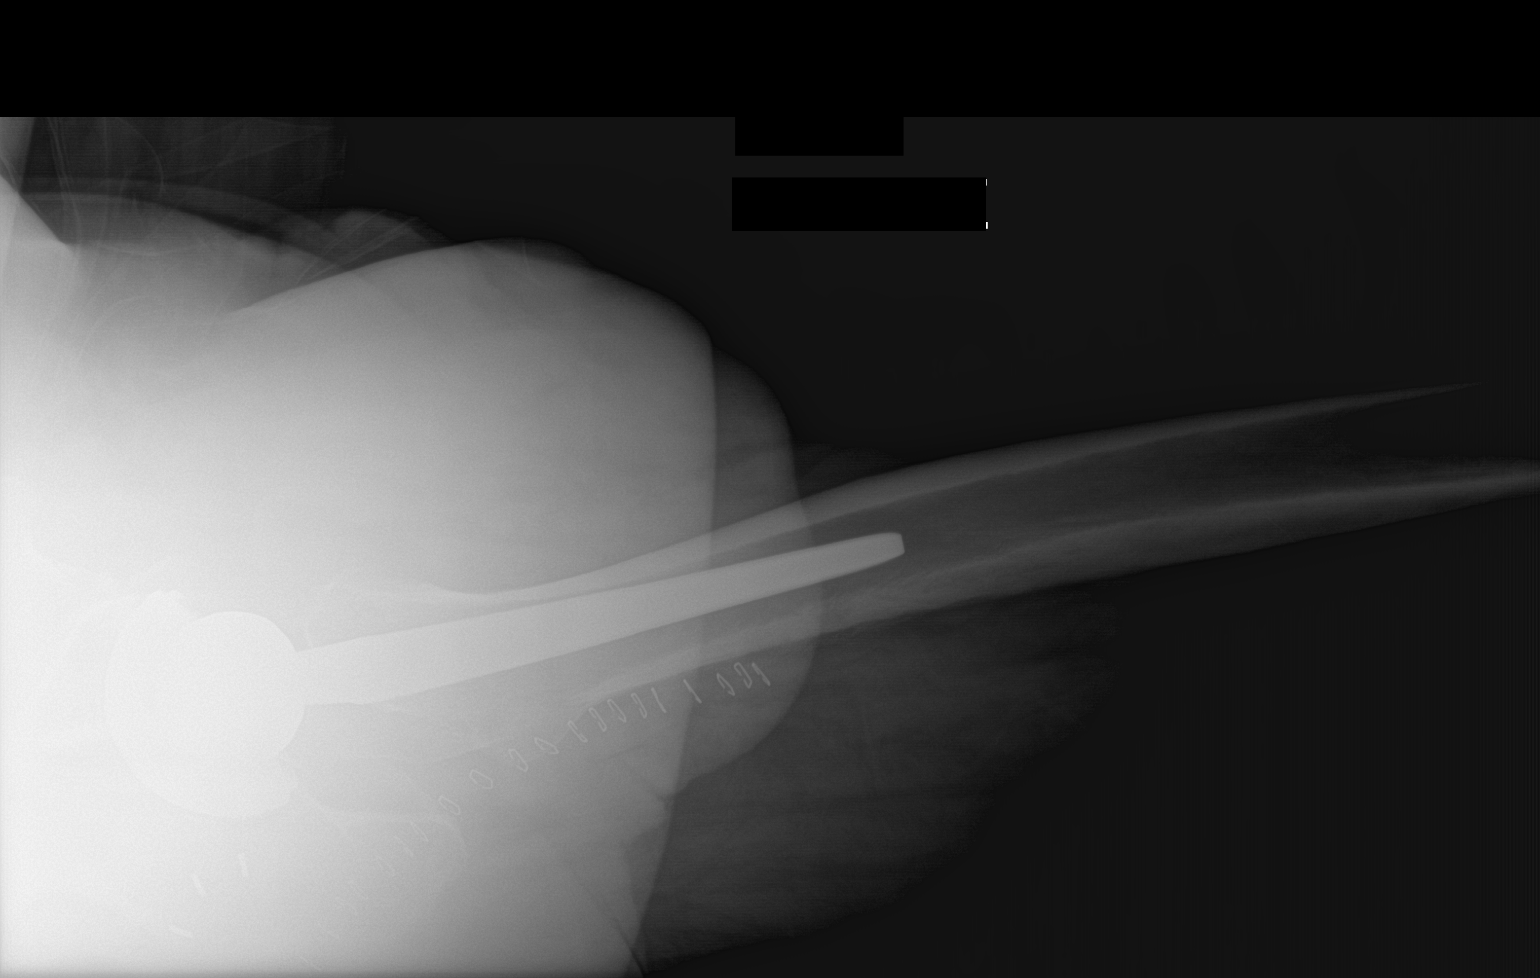

[2 of 2 positions shown; findings below may reference images not displayed]

FINDINGS: Left hip prosthesis is noted in satisfactory position. No acute bony
abnormality or soft tissue abnormality is seen.
IMPRESSION: No acute abnormality noted.

## 2018-01-22 LAB — CBC AND DIFFERENTIAL
HCT: 41 (ref 41–53)
Hemoglobin: 13.8 (ref 13.5–17.5)
Neutrophils Absolute: 4
Platelets: 310 (ref 150–399)
WBC: 6

## 2018-01-22 LAB — HEPATIC FUNCTION PANEL
ALT: 22 (ref 10–40)
AST: 20 (ref 14–40)

## 2018-01-22 LAB — BASIC METABOLIC PANEL
BUN: 12 (ref 4–21)
Creatinine: 1 (ref 0.6–1.3)
Glucose: 177
Potassium: 4.3 (ref 3.4–5.3)
Sodium: 138 (ref 137–147)

## 2018-01-22 LAB — LIPID PANEL
Cholesterol: 204 — AB (ref 0–200)
HDL: 45 (ref 35–70)
LDL Cholesterol: 115
Triglycerides: 220 — AB (ref 40–160)

## 2018-01-22 LAB — HEMOGLOBIN A1C: Hemoglobin A1C: 7.8

## 2018-01-22 LAB — PSA: PSA: 6.1

## 2018-01-25 ENCOUNTER — Other Ambulatory Visit: Payer: Self-pay

## 2018-03-26 ENCOUNTER — Encounter: Payer: Self-pay | Admitting: Urology

## 2018-03-26 ENCOUNTER — Ambulatory Visit: Payer: 59 | Admitting: Urology

## 2018-03-26 VITALS — BP 162/95 | HR 86 | Ht 68.0 in | Wt 232.4 lb

## 2018-03-26 DIAGNOSIS — C61 Malignant neoplasm of prostate: Secondary | ICD-10-CM | POA: Diagnosis not present

## 2018-03-26 NOTE — Progress Notes (Signed)
03/26/2018 11:19 AM   Duane Grant 1956-05-24 [id number]  Referring provider: Nathaneil Canary, PA-C [place], [place] [address]  Chief Complaint  Patient presents with  . Elevated PSA    H/O prostate cancer    HPI: 62 year old male referred for evaluation of an elevated PSA.  He underwent radiation therapy for prostate cancer while living in [place] around 2010.  His prior records were not available for review at the time of this visit.  He does not know his PSA.  Review of available records remarkable for the following PSA levels:  10/2014    1.26 06/2017  3.5 01/2018    6.1  He has mild urinary frequency and nocturia x2.  He has been on tamsulosin for approximately 10 years.  His voiding symptoms are not bothersome.  He denies bony pain.  There has been no gross hematuria.  PMH: Past Medical History:  Diagnosis Date  . Arthritis   . Cancer [facility]) 2009   Prostate, radiation but no surgery  . Diabetes mellitus without complication ([place])   . History of prostate cancer   . Hyperlipidemia   . Hypertension     Surgical History: Past Surgical History:  Procedure Laterality Date  . HERNIA REPAIR  1607   Umbilical  . HERNIA REPAIR  2010  . Removal Fatty Tumor Right 2015   [facility], right side of chin/jaw  . TOTAL HIP ARTHROPLASTY Left 06/09/2016   Procedure: TOTAL HIP ARTHROPLASTY;  Surgeon: Thornton Park, MD;  Location: [facility] ORS;  Service: Orthopedics;  Laterality: Left;    Home Medications:  Allergies as of 03/26/2018   No Known Allergies     Medication List        Accurate as of 03/26/18 11:19 AM. Always use your most recent med list.          aspirin EC 81 MG tablet Take 81 mg by mouth daily.   Fish Oil 1200 MG Caps Take 1 capsule by mouth 2 (two) times daily.   lisinopril-hydrochlorothiazide 10-12.5 MG tablet Commonly known as:  PRINZIDE,ZESTORETIC Take 0.5 tablets by mouth daily.   meloxicam 7.5 MG tablet Commonly known  as:  MOBIC Take 7.5 mg by mouth 2 (two) times daily.   metFORMIN 1000 MG tablet Commonly known as:  GLUCOPHAGE Take 1,000 mg by mouth 2 (two) times daily with a meal.   oxyCODONE 5 MG immediate release tablet Commonly known as:  Oxy IR/ROXICODONE Take 1-2 tablets (5-10 mg total) by mouth every 4 (four) hours as needed for moderate pain.   tamsulosin 0.4 MG Caps capsule Commonly known as:  FLOMAX Take 0.4 mg by mouth daily after supper.       Allergies: No Known Allergies  Family History: Family History  Problem Relation Age of Onset  . Diabetes Mother   . Hypertension Mother     Social History:  reports that he quit smoking about 23 years ago. His smoking use included cigarettes. He smoked 0.50 packs per day. He has never used smokeless tobacco. He reports that he drinks alcohol. He reports that he does not use drugs.  ROS: UROLOGY Frequent Urination?: Yes Hard to postpone urination?: No Burning/pain with urination?: No Get up at night to urinate?: Yes Leakage of urine?: No Urine stream starts and stops?: No Trouble starting stream?: No Do you have to strain to urinate?: No Blood in urine?: No Urinary tract infection?: No Sexually transmitted disease?: No Injury to kidneys or bladder?: No  Painful intercourse?: No Weak stream?: No Erection problems?: Yes Penile pain?: No  Gastrointestinal Nausea?: No Vomiting?: No Indigestion/heartburn?: No Diarrhea?: No Constipation?: No  Constitutional Fever: No Night sweats?: No Weight loss?: No Fatigue?: No  Skin Skin rash/lesions?: No Itching?: No  Eyes Blurred vision?: No Double vision?: No  Ears/Nose/Throat Sore throat?: No Sinus problems?: No  Hematologic/Lymphatic Swollen glands?: No Easy bruising?: No  Cardiovascular Leg swelling?: No Chest pain?: No  Respiratory Cough?: No Shortness of breath?: No  Endocrine Excessive thirst?: No  Musculoskeletal Back pain?: No Joint pain?:  No  Neurological Headaches?: No Dizziness?: No  Psychologic Depression?: No Anxiety?: No  Physical Exam: BP (!) 162/95 (BP Location: Left Arm, Patient Position: Sitting, Cuff Size: Large)   Pulse 86   Ht 5\' 8"  (1.727 m)   Wt 232 lb 6.4 oz (105.4 kg)   BMI 35.34 kg/m   Constitutional:  Alert and oriented, No acute distress. HEENT: [place] AT, moist mucus membranes.  Trachea midline, no masses. Cardiovascular: No clubbing, cyanosis, or edema. Respiratory: Normal respiratory effort, no increased work of breathing. GI: Abdomen is soft, nontender, nondistended, no abdominal masses GU: No CVA tenderness Lymph: No cervical or inguinal lymphadenopathy. Skin: No rashes, bruises or suspicious lesions. Neurologic: Grossly intact, no focal deficits, moving all 4 extremities. Psychiatric: Normal mood and affect.   Assessment & Plan:   62 year old male with a rising PSA after radiation therapy for prostate cancer.  He and his wife are informed his PSA levels are concerning for recurrent prostate cancer.  Will schedule a CT of the abdomen pelvis with contrast for further evaluation.  Potential salvage therapies were discussed including cryoablation and radical prostatectomy.  Will request his prior treatment records.  Abbie Sons, [place] [address] [place], [place] [address] [phone]

## 2018-04-02 ENCOUNTER — Telehealth: Payer: Self-pay | Admitting: Urology

## 2018-04-02 DIAGNOSIS — R3129 Other microscopic hematuria: Secondary | ICD-10-CM

## 2018-04-02 NOTE — Telephone Encounter (Signed)
Patient Is having a CT scan in [place] and will need orders for BUN and Creatine put in please  Can you put them for me please  Thanks, Sharyn Lull

## 2018-04-03 NOTE — Telephone Encounter (Signed)
Orders were entered.

## 2018-04-09 ENCOUNTER — Other Ambulatory Visit
Admission: RE | Admit: 2018-04-09 | Discharge: 2018-04-09 | Disposition: A | Discharge status: Home / Self Care | Payer: 59 | Source: Ambulatory Visit | Attending: Urology | Admitting: Urology

## 2018-04-09 ENCOUNTER — Ambulatory Visit
Admission: RE | Admit: 2018-04-09 | Discharge: 2018-04-09 | Disposition: A | Discharge status: Home / Self Care | Payer: 59 | Source: Ambulatory Visit | Attending: Urology | Admitting: Urology

## 2018-04-09 DIAGNOSIS — R3129 Other microscopic hematuria: Secondary | ICD-10-CM | POA: Diagnosis not present

## 2018-04-09 DIAGNOSIS — K8020 Calculus of gallbladder without cholecystitis without obstruction: Secondary | ICD-10-CM | POA: Diagnosis not present

## 2018-04-09 DIAGNOSIS — C61 Malignant neoplasm of prostate: Secondary | ICD-10-CM | POA: Insufficient documentation

## 2018-04-09 LAB — CREATININE, SERUM: Creatinine, Ser: 0.84 mg/dL (ref 0.61–1.24)

## 2018-04-09 LAB — BUN: BUN: 13 mg/dL (ref 8–23)

## 2018-04-09 MED ORDER — IOHEXOL 300 MG/ML  SOLN
100.0000 mL | Freq: Once | INTRAMUSCULAR | Status: AC | PRN
  Administered 2018-04-09: 100 mL via INTRAVENOUS

## 2018-04-13 ENCOUNTER — Telehealth: Payer: Self-pay

## 2018-04-13 NOTE — Telephone Encounter (Signed)
Called pt informed him of the results below. Pt gave verbal understanding. Pt states that he can only do Monday appointments, scheduled pt for the first Monday available.

## 2018-04-13 NOTE — Telephone Encounter (Signed)
-----   Message from Abbie Sons, MD sent at 04/13/2018 11:57 AM EDT ----- CT shows no evidence of disease in the pelvis outside the prostate.  There is no lymph node enlargement.  Based on his rising PSA he most likely has recurrent cancer in the prostate.  Recommend a follow-up appointment to discuss management options.

## 2018-06-04 ENCOUNTER — Encounter: Payer: Self-pay | Admitting: Urology

## 2018-06-04 ENCOUNTER — Ambulatory Visit (INDEPENDENT_AMBULATORY_CARE_PROVIDER_SITE_OTHER): Payer: 59 | Admitting: Urology

## 2018-06-04 ENCOUNTER — Encounter

## 2018-06-04 VITALS — BP 149/83 | HR 75 | Ht 68.0 in | Wt 233.2 lb

## 2018-06-04 DIAGNOSIS — C61 Malignant neoplasm of prostate: Secondary | ICD-10-CM

## 2018-06-04 NOTE — Progress Notes (Signed)
06/04/2018 9:36 AM   Duane Grant 1956-03-25 [id number]  Referring provider: Nathaneil Canary, PA-C [place], [place] [address]  Chief Complaint  Patient presents with  . Prostate Cancer    HPI: 62 year old male status post combination external beam/brachytherapy for prostate cancer approximately 10 years ago.  He was seen in early August and PSA had risen over the last 3 years from 1.26-6.1.  CT of the abdomen pelvis was recommended which showed no lymphadenopathy or evidence of extracapsular disease.  He presents today to discuss management options.   PMH: Past Medical History:  Diagnosis Date  . Arthritis   . Cancer [facility]) 2009   Prostate, radiation but no surgery  . Diabetes mellitus without complication ([place])   . History of prostate cancer   . Hyperlipidemia   . Hypertension     Surgical History: Past Surgical History:  Procedure Laterality Date  . HERNIA REPAIR  1540   Umbilical  . HERNIA REPAIR  2010  . Removal Fatty Tumor Right 2015   [facility], right side of chin/jaw  . TOTAL HIP ARTHROPLASTY Left 06/09/2016   Procedure: TOTAL HIP ARTHROPLASTY;  Surgeon: Thornton Park, MD;  Location: [facility] ORS;  Service: Orthopedics;  Laterality: Left;    Home Medications:  Allergies as of 06/04/2018   No Known Allergies     Medication List        Accurate as of 06/04/18  9:36 AM. Always use your most recent med list.          aspirin EC 81 MG tablet Take 81 mg by mouth daily.   Fish Oil 1200 MG Caps Take 1 capsule by mouth 2 (two) times daily.   lisinopril-hydrochlorothiazide 10-12.5 MG tablet Commonly known as:  PRINZIDE,ZESTORETIC Take 0.5 tablets by mouth daily.   meloxicam 7.5 MG tablet Commonly known as:  MOBIC Take 7.5 mg by mouth 2 (two) times daily.   metFORMIN 1000 MG tablet Commonly known as:  GLUCOPHAGE Take 1,000 mg by mouth 2 (two) times daily with a meal.   oxyCODONE 5 MG immediate release tablet Commonly known  as:  Oxy IR/ROXICODONE Take 1-2 tablets (5-10 mg total) by mouth every 4 (four) hours as needed for moderate pain.   tamsulosin 0.4 MG Caps capsule Commonly known as:  FLOMAX Take 0.4 mg by mouth daily after supper.       Allergies: No Known Allergies  Family History: Family History  Problem Relation Age of Onset  . Diabetes Mother   . Hypertension Mother     Social History:  reports that he quit smoking about 23 years ago. His smoking use included cigarettes. He smoked 0.50 packs per day. He has never used smokeless tobacco. He reports that he drinks alcohol. He reports that he does not use drugs.  ROS: UROLOGY Frequent Urination?: No Hard to postpone urination?: No Burning/pain with urination?: No Get up at night to urinate?: No Leakage of urine?: No Urine stream starts and stops?: No Trouble starting stream?: No Do you have to strain to urinate?: No Blood in urine?: No Urinary tract infection?: No Sexually transmitted disease?: No Injury to kidneys or bladder?: No Painful intercourse?: No Weak stream?: No Erection problems?: No Penile pain?: No  Gastrointestinal Nausea?: No Vomiting?: No Indigestion/heartburn?: No Diarrhea?: No Constipation?: No  Constitutional Fever: No Night sweats?: No Weight loss?: No Fatigue?: No  Skin Skin rash/lesions?: No Itching?: No  Eyes Blurred vision?: No Double vision?: No  Ears/Nose/Throat Sore throat?: No  Sinus problems?: No  Hematologic/Lymphatic Swollen glands?: No Easy bruising?: No  Cardiovascular Leg swelling?: No Chest pain?: No  Respiratory Cough?: No Shortness of breath?: No  Endocrine Excessive thirst?: No  Musculoskeletal Back pain?: No Joint pain?: No  Neurological Headaches?: No Dizziness?: No  Psychologic Depression?: No Anxiety?: No  Physical Exam: BP (!) 149/83 (BP Location: Left Arm, Patient Position: Sitting, Cuff Size: Large)   Pulse 75   Ht 5\' 8"  (1.727 m)   Wt 233 lb  3.2 oz (105.8 kg)   BMI 35.46 kg/m   Constitutional:  Alert and oriented, No acute distress. HEENT: [place] AT, moist mucus membranes.  Trachea midline, no masses. Cardiovascular: No clubbing, cyanosis, or edema. Respiratory: Normal respiratory effort, no increased work of breathing. GI: Abdomen is soft, nontender, nondistended, no abdominal masses GU: No CVA tenderness Lymph: No cervical or inguinal lymphadenopathy. Skin: No rashes, bruises or suspicious lesions. Neurologic: Grossly intact, no focal deficits, moving all 4 extremities. Psychiatric: Normal mood and affect.   Assessment & Plan:   62 year old male with a rising PSA status post primary radiation therapy for prostate cancer.  His prior treatment records have been requested but have not been received.  Management options were discussed including continued surveillance which was not recommended, scheduling prostate biopsy or salvage therapy based on his rising PSA.  I recommended scheduling prostate biopsy and he is in agreement.  The procedure was discussed including potential risks of bleeding and infection/sepsis.  He may want to wait until after the first year to have his biopsy performed and a repeat PSA today was ordered.  If it is continuing to rise he will have the biopsy performed earlier.    Abbie Sons, [place] [address], [place], [place] [address] [phone]

## 2018-06-05 LAB — PSA: PROSTATE SPECIFIC AG, SERUM: 7.4 ng/mL — AB (ref 0.0–4.0)

## 2018-06-07 ENCOUNTER — Telehealth: Payer: Self-pay | Admitting: Family Medicine

## 2018-06-07 NOTE — Telephone Encounter (Signed)
Patient notified, Biopsy scheduled and instructions mailed to patient. Cardiology clearance faxed

## 2018-06-07 NOTE — Telephone Encounter (Signed)
-----   Message from Abbie Sons, MD sent at 06/07/2018 10:38 AM EDT ----- PSA has increased to 7.4.  We discussed scheduling a prostate biopsy at the visit earlier this week.

## 2018-07-16 ENCOUNTER — Ambulatory Visit (INDEPENDENT_AMBULATORY_CARE_PROVIDER_SITE_OTHER): Payer: 59 | Admitting: Urology

## 2018-07-16 ENCOUNTER — Other Ambulatory Visit: Payer: Self-pay | Admitting: Urology

## 2018-07-16 ENCOUNTER — Encounter: Payer: Self-pay | Admitting: Urology

## 2018-07-16 VITALS — BP 147/82 | HR 82 | Ht 68.0 in | Wt 232.4 lb

## 2018-07-16 DIAGNOSIS — C61 Malignant neoplasm of prostate: Secondary | ICD-10-CM

## 2018-07-16 MED ORDER — GENTAMICIN SULFATE 40 MG/ML IJ SOLN
80.0000 mg | Freq: Once | INTRAMUSCULAR | Status: AC
  Administered 2018-07-16: 80 mg via INTRAMUSCULAR

## 2018-07-16 MED ORDER — LEVOFLOXACIN 500 MG PO TABS
500.0000 mg | ORAL_TABLET | Freq: Once | ORAL | Status: AC
  Administered 2018-07-16: 500 mg via ORAL

## 2018-07-16 NOTE — Progress Notes (Signed)
Prostate Biopsy Procedure   Indications: 62 year old male with history of prostate cancer status post EBRT/brachytherapy approximately 10 years ago.  PSA rise over the last 3 years from 1.26-7.4.  CT abdomen pelvis no evidence of adenopathy or gross disease in pelvis.  Informed consent was obtained after discussing risks/benefits of the procedure.  A time out was performed to ensure correct patient identity.  Pre-Procedure: - Last PSA Level: 7.18 May 2018 - Gentamicin given prophylactically - Levaquin 500 mg administered PO -Transrectal Ultrasound performed revealing a 27 gm prostate -No significant hypoechoic or median lobe noted  Procedure: - Prostate block performed using 10 cc 1% lidocaine and biopsies taken from sextant areas, a total of 12 under ultrasound guidance.  Post-Procedure: - Patient tolerated the procedure well - He was counseled to seek immediate medical attention if experiences any severe pain, significant bleeding, or fevers - Return in one week to discuss biopsy results   John Giovanni, MD

## 2018-07-19 ENCOUNTER — Other Ambulatory Visit: Payer: Self-pay | Admitting: Urology

## 2018-07-19 LAB — PATHOLOGY REPORT

## 2018-07-30 ENCOUNTER — Encounter: Payer: Self-pay | Admitting: Urology

## 2018-07-30 ENCOUNTER — Ambulatory Visit (INDEPENDENT_AMBULATORY_CARE_PROVIDER_SITE_OTHER): Payer: 59 | Admitting: Urology

## 2018-07-30 VITALS — BP 138/82 | HR 78 | Ht 68.0 in | Wt 228.6 lb

## 2018-07-30 DIAGNOSIS — C61 Malignant neoplasm of prostate: Secondary | ICD-10-CM

## 2018-07-30 NOTE — Progress Notes (Signed)
07/30/2018  3:47 PM   [name] [birth year] [id number]  Referring provider: Nathaneil Canary, PA-C [place], [place] [address]  Chief Complaint  Patient presents with  . biopsy results   Urological History: 1. 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   HPI: Duane Grant is a 62 y.o. male presenting today, with his wife, to discuss his options following his recent biopsy results.  - Denies urological symptoms following prostate biopsy.  - Denies dysuria, gross hematuria or flank/abdominal/pelvic/scrotal pain.   - Biopsy results revealed 3 areas of recurrent prostate cancer. Higher grade than previously noted.     PMH: Past Medical History:  Diagnosis Date  . Arthritis   . Cancer [facility]) 2009   Prostate, radiation but no surgery  . Diabetes mellitus without complication ([place])   . History of prostate cancer   . Hyperlipidemia   . Hypertension    Surgical History: Past Surgical History:  Procedure Laterality Date  . HERNIA REPAIR  1914   Umbilical  . HERNIA REPAIR  2010  . Removal Fatty Tumor Right 2015   [facility], right side of chin/jaw  . TOTAL HIP ARTHROPLASTY Left 06/09/2016   Procedure: TOTAL HIP ARTHROPLASTY;  Surgeon: Thornton Park, MD;  Location: [facility] ORS;  Service: Orthopedics;  Laterality: Left;   Home Medications:  Allergies as of 07/30/2018   No Known Allergies     Medication List       Accurate as of July 30, 2018  3:47 PM. Always use your most recent med list.        aspirin EC 81 MG tablet Take 81 mg by mouth daily.   Fish Oil 1200 MG Caps Take 1 capsule by mouth 2 (two) times daily.   lisinopril-hydrochlorothiazide 10-12.5  MG tablet Commonly known as:  PRINZIDE,ZESTORETIC Take 0.5 tablets by mouth daily.   meloxicam 7.5 MG tablet Commonly known as:  MOBIC Take 7.5 mg by mouth 2 (two) times daily.   metFORMIN 1000 MG tablet Commonly known as:  GLUCOPHAGE Take 1,000 mg by mouth 2 (two) times daily with a meal.   oxyCODONE 5 MG immediate release tablet Commonly known as:  Oxy IR/ROXICODONE Take 1-2 tablets (5-10 mg total) by mouth every 4 (four) hours as needed for moderate pain.   tamsulosin 0.4 MG Caps capsule Commonly known as:  FLOMAX Take 0.4 mg by mouth daily after supper.      Allergies: No Known Allergies  Family History: Family History  Problem Relation Age of Onset  . Diabetes Mother   . Hypertension Mother    Social History:  reports that he quit smoking about 23 years ago. His smoking use included cigarettes. He smoked 0.50 packs per day. He has never used smokeless tobacco. He reports current alcohol use. He reports that he does not use drugs.  ROS: UROLOGY Frequent Urination?: No Hard to postpone urination?: No Burning/pain with urination?: No Get up at night to urinate?: No Leakage of urine?: No Urine stream starts and stops?: No Trouble starting stream?: No Do you have to strain to urinate?: No Blood in urine?: No Urinary tract infection?: No Sexually transmitted disease?: No Injury to kidneys or bladder?: No Painful intercourse?: No Weak  stream?: No Erection problems?: No Penile pain?: No  Gastrointestinal Nausea?: No Vomiting?: No Indigestion/heartburn?: No Diarrhea?: No Constipation?: No  Constitutional Fever: No Night sweats?: No Weight loss?: No Fatigue?: No  Skin Skin rash/lesions?: No Itching?: No  Eyes Blurred vision?: No Double vision?: No  Ears/Nose/Throat Sore throat?: No Sinus problems?: No  Hematologic/Lymphatic Swollen glands?: No Easy bruising?: No  Cardiovascular Leg swelling?: No Chest pain?: No  Respiratory Cough?:  No Shortness of breath?: No  Endocrine Excessive thirst?: No  Musculoskeletal Back pain?: No Joint pain?: No  Neurological Headaches?: No Dizziness?: No  Psychologic Depression?: No Anxiety?: No  Physical Exam: BP 138/82 (BP Location: Left Arm, Patient Position: Sitting, Cuff Size: Normal)   Pulse 78   Ht 5\' 8"  (1.727 m)   Wt 228 lb 9.6 oz (103.7 kg)   BMI 34.76 kg/m   Constitutional:  Alert and oriented, No acute distress. Respiratory: Normal respiratory effort, no increased work of breathing. GU: No CVA tenderness Skin: No rashes, bruises or suspicious lesions. Neurologic: Grossly intact, no focal deficits, moving all 4 extremities. Psychiatric: Normal mood and affect.  Pertinent Imaging: Biopsy Report scanned - See EPIC  Assessment & Plan:   1. 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   - Discussed biopsy results which included 3 areas of recurrent prostate cancer. Higher grade that previously noted.   - Discussed salvage options including radical prostatectomy and cryoablation.  He was informed that post radiation radical prostatectomy would have a higher risk of urinary incontinence and bowel injury.  He is interested in pursuing cryoablation.  He requested referral to [facility].  Will schedule a bone scan.  His previous urology/radiation oncology records will also be requested.   Abbie Sons, MD  [facility] [address], [employer], [place] [address] [phone]  15-minute discussion with patient regarding imaging results, diagnosis possibilities, treatment options, risks and benefits. The patient had few questions and concerns regarding these options and the long-term effects.  I, Temidayo Atanda-Ogunleye , am acting as a scribe for [employer], Ronda Fairly, MD  I, Abbie Sons, MD, have reviewed all documentation for this visit. The documentation on  07/30/18 for the exam, diagnosis, procedures, and orders are all accurate and complete.

## 2018-08-02 ENCOUNTER — Encounter: Payer: Self-pay | Admitting: Urology

## 2018-08-20 ENCOUNTER — Ambulatory Visit
Admission: RE | Admit: 2018-08-20 | Discharge: 2018-08-20 | Disposition: A | Discharge status: Home / Self Care | Payer: 59 | Source: Ambulatory Visit | Attending: Urology | Admitting: Urology

## 2018-08-20 ENCOUNTER — Encounter
Admission: RE | Admit: 2018-08-20 | Discharge: 2018-08-20 | Disposition: A | Discharge status: Home / Self Care | Payer: 59 | Source: Ambulatory Visit | Attending: Urology | Admitting: Urology

## 2018-08-20 DIAGNOSIS — C61 Malignant neoplasm of prostate: Secondary | ICD-10-CM | POA: Insufficient documentation

## 2018-08-20 MED ORDER — TECHNETIUM TC 99M MEDRONATE IV KIT
22.0000 | PACK | Freq: Once | INTRAVENOUS | Status: AC | PRN
  Administered 2018-08-20: 23.565 via INTRAVENOUS

## 2018-08-21 ENCOUNTER — Telehealth: Payer: Self-pay

## 2018-08-21 NOTE — Telephone Encounter (Signed)
-----   Message from Abbie Sons, MD sent at 08/21/2018 10:49 AM EST ----- Bone scan showed no evidence of metastatic disease

## 2018-08-21 NOTE — Telephone Encounter (Signed)
Left pt mess to call 

## 2018-09-26 NOTE — Telephone Encounter (Signed)
Letter sent.

## 2018-10-01 DIAGNOSIS — Z8546 Personal history of malignant neoplasm of prostate: Secondary | ICD-10-CM | POA: Diagnosis not present

## 2018-10-01 DIAGNOSIS — C61 Malignant neoplasm of prostate: Secondary | ICD-10-CM | POA: Diagnosis not present

## 2018-10-22 DIAGNOSIS — E119 Type 2 diabetes mellitus without complications: Secondary | ICD-10-CM | POA: Diagnosis not present

## 2018-10-22 DIAGNOSIS — M79642 Pain in left hand: Secondary | ICD-10-CM | POA: Diagnosis not present

## 2018-10-22 DIAGNOSIS — I10 Essential (primary) hypertension: Secondary | ICD-10-CM | POA: Diagnosis not present

## 2018-10-23 LAB — HEMOGLOBIN A1C: Hemoglobin A1C: 9.8

## 2018-10-23 LAB — HEPATIC FUNCTION PANEL
ALT: 23 (ref 10–40)
AST: 16 (ref 14–40)
Alkaline Phosphatase: 60 (ref 25–125)
Bilirubin, Total: 0.7

## 2018-10-23 LAB — BASIC METABOLIC PANEL
BUN: 14 (ref 4–21)
Creatinine: 1 (ref 0.6–1.3)
Glucose: 258
Potassium: 4.1 (ref 3.4–5.3)
Sodium: 133 — AB (ref 137–147)

## 2018-10-23 LAB — LIPID PANEL
Cholesterol: 222 — AB (ref 0–200)
HDL: 43 (ref 35–70)
LDL Cholesterol: 146
Triglycerides: 191 — AB (ref 40–160)

## 2018-10-29 DIAGNOSIS — M545 Low back pain: Secondary | ICD-10-CM | POA: Diagnosis not present

## 2018-10-29 DIAGNOSIS — M7072 Other bursitis of hip, left hip: Secondary | ICD-10-CM | POA: Diagnosis not present

## 2018-11-22 DIAGNOSIS — Z8546 Personal history of malignant neoplasm of prostate: Secondary | ICD-10-CM | POA: Diagnosis not present

## 2018-12-03 DIAGNOSIS — Z8546 Personal history of malignant neoplasm of prostate: Secondary | ICD-10-CM | POA: Diagnosis not present

## 2018-12-03 DIAGNOSIS — C61 Malignant neoplasm of prostate: Secondary | ICD-10-CM | POA: Diagnosis not present

## 2018-12-10 DIAGNOSIS — M7072 Other bursitis of hip, left hip: Secondary | ICD-10-CM | POA: Diagnosis not present

## 2018-12-10 DIAGNOSIS — M545 Low back pain: Secondary | ICD-10-CM | POA: Diagnosis not present

## 2018-12-17 DIAGNOSIS — C61 Malignant neoplasm of prostate: Secondary | ICD-10-CM | POA: Diagnosis not present

## 2018-12-31 DIAGNOSIS — C61 Malignant neoplasm of prostate: Secondary | ICD-10-CM | POA: Diagnosis not present

## 2019-02-04 DIAGNOSIS — C61 Malignant neoplasm of prostate: Secondary | ICD-10-CM | POA: Diagnosis not present

## 2019-02-18 ENCOUNTER — Other Ambulatory Visit: Payer: Self-pay

## 2019-02-18 ENCOUNTER — Ambulatory Visit
Admission: EM | Admit: 2019-02-18 | Discharge: 2019-02-18 | Disposition: A | Discharge status: Home / Self Care | Payer: BC Managed Care – PPO | Attending: Urgent Care | Admitting: Urgent Care

## 2019-02-18 DIAGNOSIS — A6001 Herpesviral infection of penis: Secondary | ICD-10-CM

## 2019-02-18 DIAGNOSIS — Z760 Encounter for issue of repeat prescription: Secondary | ICD-10-CM

## 2019-02-18 MED ORDER — VALACYCLOVIR HCL 1 G PO TABS: 1000.0000 mg | ORAL_TABLET | Freq: Three times a day (TID) | ORAL | 1 refills | Status: DC

## 2019-02-18 NOTE — ED Provider Notes (Addendum)
[place], [place]   Name: Duane Grant DOB: 01-04-1956 MRN: [id number] CSN: [id number] PCP: Nathaneil Canary, PA-C  Arrival date and time:  02/18/19 1730  Chief Complaint:  Medication Refill   NOTE: Prior to seeing the patient today, I have reviewed the triage nursing documentation and vital signs. Clinical staff has updated patient's PMH/PSHx, current medication list, and drug allergies/intolerances to ensure comprehensive history available to assist in medical decision making.   History:   HPI: Duane Grant is a 63 y.o. male who presents today with complaints of a genital herpes outbreak. Painful penile lesions declared approximately 1 week ago. Patient denies recent illness. Patient remains sexually active. He notes that he makes extra effort to ensure proper perineal hygiene. Patient has no associated urinary symptoms. 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 Outbreaks do not occur that often per his report. Patient called PCP to request a refill of his antiviral medication only to learn that his provider was no longer in practice. Patient has an appointment at [facility] on 03/11/2019 to establish primary care with Dr. Nobie Putnam. Patient presents today stating, "I just can't wait that long to get this taken care of. I need my medicine".  Past Medical History:  Diagnosis Date  . Arthritis   . Cancer [facility]) 2009   Prostate, radiation but no surgery  . Diabetes mellitus without complication ([place])   . History of prostate cancer   . Hyperlipidemia   . Hypertension     Past Surgical History:  Procedure Laterality Date  . HERNIA REPAIR  1700   Umbilical  . HERNIA REPAIR  2010  . Removal Fatty Tumor Right 2015   [facility], right side of chin/jaw  . TOTAL HIP ARTHROPLASTY Left 06/09/2016   Procedure: TOTAL HIP ARTHROPLASTY;  Surgeon: Thornton Park, MD;  Location: [facility] ORS;  Service: Orthopedics;   Laterality: Left;    Family History  Problem Relation Age of Onset  . Diabetes Mother   . Hypertension Mother     Social History   Tobacco Use  . Smoking status: Former Smoker    Packs/day: 0.50    Types: Cigarettes    Quit date: 11/14/1994    Years since quitting: 24.2  . Smokeless tobacco: Never Used  Substance Use Topics  . Alcohol use: Yes    Comment: occassional  . Drug use: No    Patient Active Problem List   Diagnosis Date Noted  . Status post total hip replacement, left 06/09/2016  . Hip pain 05/18/2016  . Diabetes ([employer]) 02/06/2014  . History of prostate cancer 02/06/2014  . 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 Accelerated essential hypertension 12/06/2013  . Exposure to HIV 12/06/2013  . Headache 12/06/2013  . Herpesviral infection of urogenital system 12/06/2013  . Male erectile dysfunction 12/06/2013  . Neck mass 12/06/2013    Home Medications:    Current Meds  Medication Sig  . aspirin EC 81 MG tablet Take 81 mg by mouth daily.  Marland Kitchen lisinopril-hydrochlorothiazide (PRINZIDE,ZESTORETIC) 10-12.5 MG tablet Take 0.5 tablets by mouth daily.  . metFORMIN (GLUCOPHAGE) 1000 MG tablet Take 1,000 mg by mouth 2 (two) times daily with a meal.  . Omega-3 Fatty Acids (FISH OIL) 1200 MG CAPS Take 1 capsule by mouth 2 (two) times daily.  . tamsulosin (FLOMAX) 0.4 MG CAPS capsule Take 0.4 mg by mouth daily after supper.    Allergies:   Patient has no known  allergies.  Review of Systems (ROS): Review of Systems  Constitutional: Negative for chills and fever.  Respiratory: Negative for cough and shortness of breath.   Cardiovascular: Negative for chest pain and palpitations.  Gastrointestinal: Negative for abdominal pain, diarrhea, nausea and vomiting.  Genitourinary: Positive for penile pain (known HSV). Negative for discharge, dysuria, flank pain, frequency, hematuria, penile swelling, scrotal swelling, testicular pain and urgency.  Musculoskeletal: Negative for arthralgias  and back pain.  Skin: Positive for color change and rash.  Hematological: Negative for adenopathy.     Vital Signs: Today's Vitals   02/18/19 1740 02/18/19 1743 02/18/19 1756  BP: (!) 149/91    Pulse: 84    Resp: 18    Temp: 98.3 F (36.8 C)    TempSrc: Oral    SpO2: 98%    Weight:  222 lb (100.7 kg)   Height:  5' 8.5" (1.74 m)   PainSc:  0-No pain 0-No pain    Physical Exam: Physical Exam  Constitutional: He is oriented to person, place, and time and well-developed, well-nourished, and in no distress.  HENT:  Head: Normocephalic and atraumatic.  Mouth/Throat: Oropharynx is clear and moist and mucous membranes are normal.  Eyes: Pupils are equal, round, and reactive to light. EOM are normal.  Neck: Normal range of motion. Neck supple.  Cardiovascular: Normal rate, regular rhythm, normal heart sounds and intact distal pulses. Exam reveals no gallop and no friction rub.  No murmur heard. Pulmonary/Chest: Effort normal and breath sounds normal. No respiratory distress. He has no wheezes. He has no rales.  Abdominal: Soft. Bowel sounds are normal. He exhibits no distension.  Genitourinary:    Testes/scrotum normal.  Penis exhibits lesions (herpetic). Penis exhibits no edema. No discharge found.  Lymphadenopathy:    He has no cervical adenopathy.  Neurological: He is alert and oriented to person, place, and time. Gait normal. GCS score is 15.  Skin: Skin is warm and dry. No rash noted.  Psychiatric: Mood, memory, affect and judgment normal.  Nursing note and vitals reviewed.   Urgent Care Treatments / Results:   LABS: PLEASE NOTE: all labs that were ordered this encounter are listed, however only abnormal results are displayed. Labs Reviewed - No data to display  EKG: -None  RADIOLOGY: No results found.  PROCEDURES: Procedures  MEDICATIONS RECEIVED THIS VISIT: Medications - No data to display  PERTINENT CLINICAL COURSE NOTES/UPDATES:   Initial Impression /  Assessment and Plan / Urgent Care Course:  Pertinent labs & imaging results that were available during my care of the patient were personally reviewed by me and considered in my medical decision making (see lab/imaging section of note for values and interpretations).  Duane Grant is a 63 y.o. male who presents to [facility] today with complaints of Medication Refill   Patient overall well appearing and in no acute distress today in clinic. Exam reveals herpetic rash consistent with known genital HSV. Patient in the midst of flare of known STI. He has no associated urinary symptoms. His regular PCP is no longer in practice. He is scheduled to establish care later this month with Dr. Parks Ranger at [facility]. He is requesting antiviral refill, which I feel is reasonable given his current symptoms. Rx sent for valacyclovir 1000 mg TID x 1 week with 1 refill. Reviewed safe sexual practices to prevent transmission.   I have reviewed the follow up and strict return precautions for any new or worsening symptoms. Patient is aware  of symptoms that would be deemed urgent/emergent, and would thus require further evaluation either here or in the emergency department. At the time of discharge, he verbalized understanding and consent with the discharge plan as it was reviewed with him. All questions were fielded by provider and/or clinic staff prior to patient discharge.    Final Clinical Impressions / Urgent Care Diagnoses:   Final diagnoses:  Herpes simplex infection of penis  Medication refill    New Prescriptions:  [place] Controlled Substance Registry consulted? Not Applicable  Meds ordered this encounter  Medications  . valACYclovir (VALTREX) 1000 MG tablet    Sig: Take 1 tablet (1,000 mg total) by mouth 3 (three) times daily.    Dispense:  21 tablet    Refill:  1    Recommended Follow up Care:  Patient encouraged to follow up with the following provider within the specified time  frame, or sooner as dictated by the severity of his symptoms. As always, he was instructed that for any urgent/emergent care needs, he should seek care either here or in the emergency department for more immediate evaluation. Follow-up Information    Olin Hauser, DO On 03/11/2019.   Specialty: Family Medicine Why: As already scheduled to esablish care. Contact information: [place] [id number] [phone]         NOTE: This note was prepared using Dragon dictation software along with smaller phrase technology. Despite my best ability to proofread, there is the potential that transcriptional errors may still occur from this process, and are completely unintentional.     Karen Kitchens, NP 02/20/19 0111

## 2019-02-18 NOTE — Discharge Instructions (Addendum)
It was very nice seeing you today in clinic. Thank you for entrusting me with your care.  ° °Make arrangements to follow up with your regular doctor in 1 week for re-evaluation if not improving. If your symptoms/condition worsens, please seek follow up care either here or in the ER. Please remember, our [place] providers are "right here with you" when you need us.  ° °Again, it was my pleasure to take care of you today. Thank you for choosing our clinic. I hope that you start to feel better quickly.  ° °Leeana Creer, MSN, APRN, FNP-C, CEN °Advanced Practice Provider °[facility] °

## 2019-02-18 NOTE — ED Triage Notes (Signed)
Duane Grant reports needing medication refill due to his primary physician not being open.

## 2019-03-11 ENCOUNTER — Other Ambulatory Visit: Payer: Self-pay

## 2019-03-11 ENCOUNTER — Ambulatory Visit (INDEPENDENT_AMBULATORY_CARE_PROVIDER_SITE_OTHER): Payer: BC Managed Care – PPO | Admitting: Family Medicine

## 2019-03-11 ENCOUNTER — Encounter: Payer: Self-pay | Admitting: Family Medicine

## 2019-03-11 VITALS — BP 141/94 | HR 72 | Temp 98.4°F | Resp 16 | Ht 68.0 in | Wt 226.0 lb

## 2019-03-11 DIAGNOSIS — B3749 Other urogenital candidiasis: Secondary | ICD-10-CM | POA: Diagnosis not present

## 2019-03-11 DIAGNOSIS — E669 Obesity, unspecified: Secondary | ICD-10-CM

## 2019-03-11 DIAGNOSIS — M1812 Unilateral primary osteoarthritis of first carpometacarpal joint, left hand: Secondary | ICD-10-CM | POA: Insufficient documentation

## 2019-03-11 DIAGNOSIS — I10 Essential (primary) hypertension: Secondary | ICD-10-CM | POA: Diagnosis not present

## 2019-03-11 DIAGNOSIS — Z7689 Persons encountering health services in other specified circumstances: Secondary | ICD-10-CM

## 2019-03-11 DIAGNOSIS — E1165 Type 2 diabetes mellitus with hyperglycemia: Secondary | ICD-10-CM

## 2019-03-11 DIAGNOSIS — C61 Malignant neoplasm of prostate: Secondary | ICD-10-CM

## 2019-03-11 DIAGNOSIS — B009 Herpesviral infection, unspecified: Secondary | ICD-10-CM | POA: Insufficient documentation

## 2019-03-11 DIAGNOSIS — E66811 Obesity, class 1: Secondary | ICD-10-CM | POA: Insufficient documentation

## 2019-03-11 MED ORDER — FLUCONAZOLE 150 MG PO TABS: ORAL_TABLET | ORAL | 0 refills | Status: DC

## 2019-03-11 MED ORDER — VALACYCLOVIR HCL 1 G PO TABS: 1000.0000 mg | ORAL_TABLET | Freq: Every day | ORAL | 1 refills | Status: DC | PRN

## 2019-03-11 MED ORDER — DICLOFENAC SODIUM 1 % TD GEL: 2.0000 g | Freq: Three times a day (TID) | TRANSDERMAL | 2 refills | Status: DC | PRN

## 2019-03-11 NOTE — Assessment & Plan Note (Signed)
Followed by [facility] Managed for prostate cancer, prior treatment/radiation, now elevated PSA again

## 2019-03-11 NOTE — Assessment & Plan Note (Signed)
Clinically with localized L thumb non dominant hand pain to [facility] No other deformity or other clear causes, not consistent with carpal tunnel or neuropathy  Plan - Trial on topical diclofenac PRN, new rx, can use goodrx - Continue Tylenol PRN - Limit oral NSAIDs - Relative rest, avoid re injury F/u

## 2019-03-11 NOTE — Assessment & Plan Note (Signed)
Encourage wt loss

## 2019-03-11 NOTE — Progress Notes (Signed)
Subjective:    Patient ID: Duane Grant, male    DOB: 08-Apr-1956, 63 y.o.   MRN: [id number]  Duane Grant is a 63 y.o. male presenting on 03/11/2019 for Establish Care (thumb pain left side) and Diabetes  Previous PCP Othelia Pulling PA [facility]  HPI  Prostate Cancer Dx 2009, s/p radiation, followed by [facility] Elevated PSA recently in 2019  Osteoarthritis multiple joints (hips) / Thumb (Left) S/p R Hip Total Replacement [facility] Dr Mack Guise Doing very well overall with arthritis and mobility. He has new complaint now with L thumb joint pain, episodic 4-6 times a day when flared up, takes Tylenol PRN tried other topical creams for arthritis without relief, no other finger pain or swelling or deformity, denies numbness tingling weakness - often gripping steering wheel driving with left hand. He works as Administrator. He is cautious in and out of truck and doing well.  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 He has been on valtrex PRN for flares. He says very rarely has flare up - Recently seen at Urgent Care taken by Valtrex 500mg  TID for 1 week  Yeast Balanitis Previously on Jardiance, reviewed med records, he was [place] on this in 2020 due to yeast infection in 11/2018. - Uncircumcised, red itching rash around skin, some whitish discharge, itching  CHRONIC DM, Type 2: Reports concerns with hyperglycemia, previous A1c he admits elevated, >9 done several months ago, request records Meds: Metformin 1000mg  BID, not ready for refill Reports good compliance. Tolerating well w/o side-effects Currently on ACEi Lifestyle: - Diet (tries to follow DM diet not always adherent)  - Exercise (limited now, drives truck, previously more walking now less) Denies hypoglycemia, polyuria, visual changes, numbness or tingling.   Depression screen PHQ 2/9 03/11/2019  Decreased Interest 0  Down, Depressed, Hopeless 0  PHQ - 2 Score 0     Past Medical History:  Diagnosis Date  . Arthritis   . Cancer [facility]) 2009   Prostate, radiation but no surgery  . Elevated PSA   . History of prostate cancer   . Hyperlipidemia   . Hypertension    Past Surgical History:  Procedure Laterality Date  . HERNIA REPAIR  7408   Umbilical  . HERNIA REPAIR  2010  . Removal Fatty Tumor Right 2015   [facility], right side of chin/jaw  . 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 Marital status: Married    Spouse name: Not on file  . Number of children: Not on file  . Years of education: [employer]  . Highest education level: High school graduate  Occupational History  . Not on file  Social Needs  . Financial resource strain: Not on file  . Food insecurity    Worry: Not on file    Inability: Not on file  . Transportation needs    Medical: Not on file    Non-medical: Not on file  Tobacco Use  . Smoking status: Former Smoker    Packs/day: 0.50    Years: 35.00    Pack years: 17.50    Types: Cigarettes    Quit date: 11/14/1994    Years since quitting: 24.3  . Smokeless tobacco: Former Network engineer and Sexual Activity  . Alcohol use:  Yes    Alcohol/week: 3.0 standard drinks    Types: 3 Standard drinks or equivalent per week    Comment: occassional  . Drug use: No  . Sexual activity: Yes    Birth control/protection: None  Lifestyle  . Physical activity    Days per week: Not on file    Minutes per session: Not on file  . Stress: Not on file  Relationships  . Social Herbalist on phone: Not on file    Gets together: Not on file    Attends religious service: Not on file    Active member of club or organization: Not on file    Attends meetings of clubs or organizations: Not on file    Relationship status: Not on file  . Intimate  partner violence    Fear of current or ex partner: Not on file    Emotionally abused: Not on file    Physically abused: Not on file    Forced sexual activity: Not on file  Other Topics Concern  . Not on file  Social History Narrative  . Not on file   Family History  Problem Relation Age of Onset  . Diabetes Mother   . Hypertension Mother    Current Outpatient Medications on File Prior to Visit  Medication Sig  . aspirin EC 81 MG tablet Take 81 mg by mouth daily.  Marland Kitchen lisinopril-hydrochlorothiazide (PRINZIDE,ZESTORETIC) 10-12.5 MG tablet Take 0.5 tablets by mouth daily.  . metFORMIN (GLUCOPHAGE) 1000 MG tablet Take 1,000 mg by mouth 2 (two) times daily with a meal.  . Omega-3 Fatty Acids (FISH OIL) 1200 MG CAPS Take 1 capsule by mouth 2 (two) times daily.  . tamsulosin (FLOMAX) 0.4 MG CAPS capsule Take 0.4 mg by mouth daily after supper.   No current facility-administered medications on file prior to visit.     Review of Systems Per HPI unless specifically indicated above     Objective:    BP (!) 141/94   Pulse 72   Temp 98.4 F (36.9 C) (Oral)   Resp 16   Ht 5\' 8"  (1.727 m)   Wt 226 lb (102.5 kg)   BMI 34.36 kg/m   Wt Readings from Last 3 Encounters:  03/11/19 226 lb (102.5 kg)  02/18/19 222 lb (100.7 kg)  07/30/18 228 lb 9.6 oz (103.7 kg)    Physical Exam Vitals signs and nursing note reviewed.  Constitutional:      General: He is not in acute distress.    Appearance: He is well-developed. He is not diaphoretic.     Comments: Well-appearing, comfortable, cooperative  HENT:     Head: Normocephalic and atraumatic.  Eyes:     General:        Right eye: No discharge.        Left eye: No discharge.     Conjunctiva/sclera: Conjunctivae normal.  Cardiovascular:     Rate and Rhythm: Normal rate.  Pulmonary:     Effort: Pulmonary effort is normal.  Skin:    General: Skin is warm and dry.     Findings: No erythema or rash.  Neurological:     Mental Status: He  is alert and oriented to person, place, and time.  Psychiatric:        Behavior: Behavior normal.     Comments: Well groomed, good eye contact, normal speech and thoughts    Recent Labs    10/23/18  HGBA1C 9.8     Results  for orders placed or performed in visit on 17/51/02  Basic metabolic panel  Result Value Ref Range   Glucose 258    BUN 14 4 - 21   Creatinine 1.0 0.6 - 1.3   Potassium 4.1 3.4 - 5.3   Sodium 133 (A) 137 - 147  Lipid panel  Result Value Ref Range   Triglycerides 191 (A) 40 - 160   Cholesterol 222 (A) 0 - 200   HDL 43 35 - 70   LDL Cholesterol 146   Hepatic function panel  Result Value Ref Range   Alkaline Phosphatase 60 25 - 125   ALT 23 10 - 40   AST 16 14 - 40   Bilirubin, Total 0.7   Hemoglobin A1c  Result Value Ref Range   Hemoglobin A1C 9.8   CBC and differential  Result Value Ref Range   Hemoglobin 13.8 13.5 - 17.5   HCT 41 41 - 53   Neutrophils Absolute 4    Platelets 310 150 - 399   WBC 6.0   Basic metabolic panel  Result Value Ref Range   Glucose 177    BUN 12 4 - 21   Creatinine 1.0 0.6 - 1.3   Potassium 4.3 3.4 - 5.3   Sodium 138 137 - 147  Lipid panel  Result Value Ref Range   Triglycerides 220 (A) 40 - 160   Cholesterol 204 (A) 0 - 200   HDL 45 35 - 70   LDL Cholesterol 115   Hepatic function panel  Result Value Ref Range   ALT 22 10 - 40   AST 20 14 - 40  Hemoglobin A1c  Result Value Ref Range   Hemoglobin A1C 7.8   PSA  Result Value Ref Range   PSA 6.1   TSH  Result Value Ref Range   TSH 1.24 0.41 - 5.90      Assessment & Plan:   Problem List Items Addressed This Visit    Arthritis of carpometacarpal (CMC) joint of left thumb    Clinically with localized L thumb non dominant hand pain to [facility] No other deformity or other clear causes, not consistent with carpal tunnel or neuropathy  Plan - Trial on topical diclofenac PRN, new rx, can use goodrx - Continue Tylenol PRN - Limit oral NSAIDs - Relative rest,  avoid re injury F/u      Relevant Medications   diclofenac sodium (VOLTAREN) 1 % GEL   Essential hypertension - Primary    Elevated BP Continue current med Adjust in future if still elevated      Obesity (BMI 30.0-34.9)    Encourage wt loss      Prostate cancer ([place])    Followed by [facility] for prostate cancer, prior treatment/radiation, now elevated PSA again      Relevant Medications   fluconazole (DIFLUCAN) 150 MG tablet   valACYclovir (VALTREX) 1000 MG tablet   Recurrent HSV (herpes simplex virus)    Stable without flare Rare occurrence Recent flare 02/2019, Urgent Care, on Valtrex  Rx Valtrex 1g daily PRN 5-7 days flare in future      Relevant Medications   fluconazole (DIFLUCAN) 150 MG tablet   valACYclovir (VALTREX) 1000 MG tablet   Type 2 diabetes mellitus with hyperglycemia, without long-term current use of insulin (HCC)    Previously uncontrolled DM with hyperglycemia, A1c 9.8 on last report 10/2018 reviewed record Complications - hyperglycemia, hyperlipidemia - increases risk of future cardiovascular complications  -  Failed Jardiance d/t yeast infection, off in 2020  Plan:  1. Continue current therapy - Metformin 1000mg  BID 2. Encourage improved lifestyle - low carb, low sugar diet, reduce portion size, continue improving regular exercise 3. Check CBG , bring log to next visit for review 4. Continue ASA, ACEi - previously on Statin, seems off med now 5. DUE DM Foot exam / need to schedule DM ophtho exam, send record 6. Follow-up 6 wk DM A1c - future consider GLP1       Other Visit Diagnoses    Yeast dermatitis of penis       Relevant Medications   fluconazole (DIFLUCAN) 150 MG tablet   valACYclovir (VALTREX) 1000 MG tablet   Encounter to establish care with new doctor        Review records from prior PCP    Meds ordered this encounter  Medications  . fluconazole (DIFLUCAN) 150 MG tablet    Sig: Take one tablet by mouth on  Day 1. Repeat dose 2nd tablet on Day 3.    Dispense:  2 tablet    Refill:  0  . valACYclovir (VALTREX) 1000 MG tablet    Sig: Take 1 tablet (1,000 mg total) by mouth daily as needed (HSV flare). For up to 5-7 as needed for flare, can repeat.    Dispense:  21 tablet    Refill:  1  . diclofenac sodium (VOLTAREN) 1 % GEL    Sig: Apply 2 g topically 3 (three) times daily as needed. For thumb arthritis    Dispense:  100 g    Refill:  2     Follow up plan: Return in about 6 weeks (around 04/22/2019) for DM A1c.  Nobie Putnam, DO [facility] 03/11/2019, 5:22 PM

## 2019-03-11 NOTE — Assessment & Plan Note (Signed)
Stable without flare Rare occurrence Recent flare 02/2019, Urgent Care, on Valtrex  Rx Valtrex 1g daily PRN 5-7 days flare in future

## 2019-03-11 NOTE — Patient Instructions (Addendum)
Thank you for coming to the office today.  Ordered Valtrex 1g (1000mg ) taken once daily for up to 5-7 days in future if another flare up of herpes, rx good for up to 6 months, fill sooner to keep at home.  Ordered Diflucan (Fluconazole) Yeast medication, for your current problem, if not resolved within 3-5 days after taking 2nd dose can contact our office about this and we can order a topical cream as well.  We will review chart and check sugar diabetes result and further info at next visit 6-8 weeks  Please schedule a Follow-up Appointment to: Return in about 6 weeks (around 04/22/2019) for DM A1c.  If you have any other questions or concerns, please feel free to call the office or send a message through [place]. You may also schedule an earlier appointment if necessary.  Additionally, you may be receiving a survey about your experience at our office within a few days to 1 week by e-mail or mail. We value your feedback.  Nobie Putnam, DO [place]

## 2019-03-11 NOTE — Assessment & Plan Note (Signed)
Elevated BP Continue current med Adjust in future if still elevated

## 2019-03-11 NOTE — Assessment & Plan Note (Addendum)
Previously uncontrolled DM with hyperglycemia, A1c 9.8 on last report 10/2018 reviewed record Complications - hyperglycemia, hyperlipidemia - increases risk of future cardiovascular complications  - Failed Jardiance d/t yeast infection, off in 2020  Plan:  1. Continue current therapy - Metformin 1000mg  BID 2. Encourage improved lifestyle - low carb, low sugar diet, reduce portion size, continue improving regular exercise 3. Check CBG , bring log to next visit for review 4. Continue ASA, ACEi - previously on Statin, seems off med now 5. DUE DM Foot exam / need to schedule DM ophtho exam, send record 6. Follow-up 6 wk DM A1c - future consider GLP1

## 2019-04-29 ENCOUNTER — Encounter: Payer: Self-pay | Admitting: Family Medicine

## 2019-04-29 ENCOUNTER — Ambulatory Visit (INDEPENDENT_AMBULATORY_CARE_PROVIDER_SITE_OTHER): Payer: BC Managed Care – PPO | Admitting: Family Medicine

## 2019-04-29 ENCOUNTER — Other Ambulatory Visit: Payer: Self-pay

## 2019-04-29 VITALS — BP 132/83 | HR 74 | Temp 98.2°F | Resp 16 | Ht 68.0 in | Wt 225.0 lb

## 2019-04-29 DIAGNOSIS — E1165 Type 2 diabetes mellitus with hyperglycemia: Secondary | ICD-10-CM

## 2019-04-29 DIAGNOSIS — I10 Essential (primary) hypertension: Secondary | ICD-10-CM

## 2019-04-29 DIAGNOSIS — M1812 Unilateral primary osteoarthritis of first carpometacarpal joint, left hand: Secondary | ICD-10-CM

## 2019-04-29 DIAGNOSIS — N401 Enlarged prostate with lower urinary tract symptoms: Secondary | ICD-10-CM | POA: Diagnosis not present

## 2019-04-29 LAB — POCT GLYCOSYLATED HEMOGLOBIN (HGB A1C): Hemoglobin A1C: 8.5 % — AB (ref 4.0–5.6)

## 2019-04-29 MED ORDER — OZEMPIC (0.25 OR 0.5 MG/DOSE) 2 MG/1.5ML ~~LOC~~ SOPN: 0.2500 mg | PEN_INJECTOR | SUBCUTANEOUS | 0 refills | Status: DC

## 2019-04-29 MED ORDER — METFORMIN HCL 1000 MG PO TABS: 1000.0000 mg | ORAL_TABLET | Freq: Two times a day (BID) | ORAL | 3 refills | Status: DC

## 2019-04-29 MED ORDER — TAMSULOSIN HCL 0.4 MG PO CAPS: 0.4000 mg | ORAL_CAPSULE | Freq: Every day | ORAL | 3 refills | Status: DC

## 2019-04-29 MED ORDER — LISINOPRIL-HYDROCHLOROTHIAZIDE 10-12.5 MG PO TABS: 0.5000 | ORAL_TABLET | Freq: Every day | ORAL | 3 refills | Status: DC

## 2019-04-29 NOTE — Progress Notes (Addendum)
Subjective:    Patient ID: Duane Grant, male    DOB: 09-24-1955, 63 y.o.   MRN: [id number]  Duane Grant is a 63 y.o. male presenting on 04/29/2019 for Hypertension   HPI   Osteoarthritis multiple joints (hips) / Thumb (Left) S/p R Hip Total Replacement [facility] Dr Mack Guise Doing very well overall with arthritis and mobility. L thumb joint pain still present, now significantly improved on topical diclofenac PRN use  CHRONIC DM, Type 2: A1c down to 8.5 now He has improved his diet, reduced sweets and snacks now. Meds: Metformin 1000mg  BID Reports good compliance. Tolerating well w/o side-effects Currently on ACEi Lifestyle: - Diet (tries to follow DM diet not always adherent)  - Exercise (limited now, drives truck, previously more walking now less) Denies hypoglycemia, polyuria, visual changes, numbness or tingling.  BPH s/p Prostate Cancer treatment radiation Dx 2009, s/p radiation, followed by [facility] Elevated PSA in 2019 On Flomax, need refill Health Maintenance:  Due for Flu Shot, declines today despite counseling on benefits   Depression screen [facility] 2/9 04/29/2019 03/11/2019  Decreased Interest 0 0  Down, Depressed, Hopeless 0 0  PHQ - 2 Score 0 0    Social History   Tobacco Use  . Smoking status: Former Smoker    Packs/day: 0.50    Years: 35.00    Pack years: 17.50    Types: Cigarettes    Quit date: 11/14/1994    Years since quitting: 24.4  . Smokeless tobacco: Former Network engineer Use Topics  . Alcohol use: Yes    Alcohol/week: 3.0 standard drinks    Types: 3 Standard drinks or equivalent per week    Comment: occassional  . Drug use: No    Review of Systems Per HPI unless specifically indicated above     Objective:    BP 132/83   Pulse 74   Temp 98.2 F (36.8 C) (Oral)   Resp 16   Ht 5\' 8"  (1.727 m)   Wt 225 lb (102.1 kg)   BMI 34.21 kg/m   Wt Readings from Last 3 Encounters:  04/29/19 225 lb (102.1 kg)   03/11/19 226 lb (102.5 kg)  02/18/19 222 lb (100.7 kg)    Physical Exam Vitals signs and nursing note reviewed.  Constitutional:      General: He is not in acute distress.    Appearance: He is well-developed. He is not diaphoretic.     Comments: Well-appearing, comfortable, cooperative  HENT:     Head: Normocephalic and atraumatic.  Eyes:     General:        Right eye: No discharge.        Left eye: No discharge.     Conjunctiva/sclera: Conjunctivae normal.  Neck:     Musculoskeletal: Normal range of motion and neck supple.     Thyroid: No thyromegaly.  Cardiovascular:     Rate and Rhythm: Normal rate and regular rhythm.     Heart sounds: Normal heart sounds. No murmur.  Pulmonary:     Effort: Pulmonary effort is normal. No respiratory distress.     Breath sounds: Normal breath sounds. No wheezing or rales.  Musculoskeletal: Normal range of motion.  Lymphadenopathy:     Cervical: No cervical adenopathy.  Skin:    General: Skin is warm and dry.     Findings: No erythema or rash.  Neurological:     Mental Status: He is alert and oriented to person, place, and  time.  Psychiatric:        Behavior: Behavior normal.     Comments: Well groomed, good eye contact, normal speech and thoughts      Diabetic Foot Exam - Simple   Simple Foot Form Diabetic Foot exam was performed with the following findings: Yes 04/29/2019 10:11 AM  Visual Inspection No deformities, no ulcerations, no other skin breakdown bilaterally: Yes Sensation Testing Intact to touch and monofilament testing bilaterally: Yes Pulse Check Posterior Tibialis and Dorsalis pulse intact bilaterally: Yes Comments      Recent Labs    10/23/18 04/29/19 0949  HGBA1C 9.8 8.5*    Results for orders placed or performed in visit on 04/29/19  POCT HgB A1C  Result Value Ref Range   Hemoglobin A1C 8.5 (A) 4.0 - 5.6 %      Assessment & Plan:   Problem List Items Addressed This Visit    Arthritis of  carpometacarpal (CMC) joint of left thumb    Sigifinificant persistent problem S/p topical diclofenac from prior visit - goodrx details given      Essential hypertension    Mild elevated persistently Improves with lifestyle, diet exercise      Relevant Medications   lisinopril-hydrochlorothiazide (ZESTORETIC) 10-12.5 MG tablet   Type 2 diabetes mellitus with hyperglycemia, without long-term current use of insulin (HCC) - Primary    Moderately improved A1c from 9.8 down to 8.5 Counseling on healthy diet lifestyle regimen  Start sample GLP1 - Ozempic sample #6 doses 1 pen, 0.25mg  x 4 weeks then up to 0.5, check cost coverage, can rx any GLP1 if prefer      Relevant Medications   Semaglutide,0.25 or 0.5MG /DOS, (OZEMPIC, 0.25 OR 0.5 MG/DOSE,) 2 MG/1.5ML SOPN   lisinopril-hydrochlorothiazide (ZESTORETIC) 10-12.5 MG tablet   metFORMIN (GLUCOPHAGE) 1000 MG tablet   Other Relevant Orders   POCT HgB A1C (Completed)    Other Visit Diagnoses    Benign prostatic hyperplasia with lower urinary tract symptoms, symptom details unspecified       Relevant Medications   tamsulosin (FLOMAX) 0.4 MG CAPS capsule      Meds ordered this encounter  Medications  . Semaglutide,0.25 or 0.5MG /DOS, (OZEMPIC, 0.25 OR 0.5 MG/DOSE,) 2 MG/1.5ML SOPN    Sig: Inject 0.25 mg into the skin once a week. For first 4 weeks. Then increase dose to 0.5mg  weekly    Dispense:  1 pen    Refill:  0  . lisinopril-hydrochlorothiazide (ZESTORETIC) 10-12.5 MG tablet    Sig: Take 0.5 tablets by mouth daily.    Dispense:  90 tablet    Refill:  3  . metFORMIN (GLUCOPHAGE) 1000 MG tablet    Sig: Take 1 tablet (1,000 mg total) by mouth 2 (two) times daily with a meal.    Dispense:  180 tablet    Refill:  3  . tamsulosin (FLOMAX) 0.4 MG CAPS capsule    Sig: Take 1 capsule (0.4 mg total) by mouth daily after supper.    Dispense:  90 capsule    Refill:  3    Follow up plan: Return in about 3 months (around 07/29/2019) for  3 months DM A1c.   Nobie Putnam, [facility] 04/29/2019, 9:47 AM

## 2019-04-29 NOTE — Patient Instructions (Addendum)
Thank you for coming to the office today.  Call insurance find cost and coverage of the following  Let me know which one you want me to send in to rx  1. Ozempic (Semaglutide injection) - start 0.25mg  weekly for 4 weeks then increase to 0.5mg  weekly - This one has best benefit of weight loss and reducing Cardiovascular events (this is the one we are sampling for 6 weeks total)  2. Bydureon BCise (Exenatide ER) - once weekly - this is my preference, very good medicine well tolerated, less side effects of nausea, upset stomach. No dose changes. Cost and coverage is the problem, but we may be able to get it with the coupon card  3. Trulicity (Dulaglutide) - once weekly - this is very good one, usually one of my top choices as well, two doses, 0.75 (likely we would start) and 1.5 max dose. We can use coupon card here too  4. Victoza (Liraglutide) - once DAILY - 3 dose changes 0.6, 1.2 and 1.8, side effects nausea, upset stomach higher on this one but it is still very effective medicine  ------------------------------------  Please request a Diabetic Eye Exam from [facility] Doctor.  Call us when you get Flu Shot, we need to know the date you got it.  You are due for Pneumonia vaccine 2 dose series at age 59.  You need Prevnar-13 at pharmacy in October - send it to [place].    Please schedule a Follow-up Appointment to: Return in about 3 months (around 07/29/2019) for 3 months DM A1c.  If you have any other questions or concerns, please feel free to call the office or send a message through [place]. You may also schedule an earlier appointment if necessary.  Additionally, you may be receiving a survey about your experience at our office within a few days to 1 week by e-mail or mail. We value your feedback.  Nobie Putnam, DO [place]

## 2019-04-30 NOTE — Assessment & Plan Note (Signed)
Mild elevated persistently Improves with lifestyle, diet exercise

## 2019-04-30 NOTE — Assessment & Plan Note (Addendum)
Moderately improved A1c from 9.8 down to 8.5 Counseling on healthy diet lifestyle regimen  Start sample GLP1 - Ozempic sample #6 doses 1 pen, 0.25mg  x 4 weeks then up to 0.5, check cost coverage, can rx any GLP1 if prefer

## 2019-04-30 NOTE — Assessment & Plan Note (Addendum)
Sigifinificant persistent problem S/p topical diclofenac from prior visit - goodrx details given

## 2019-05-15 ENCOUNTER — Telehealth: Payer: Self-pay

## 2019-05-15 NOTE — Telephone Encounter (Addendum)
The pt called to confirm that he suppose to continue taking the metformin while taking the Ozempic. I confirmed that the Ozempic was added on with the Metformin. I encourage the patient to continue on both medications. He then mention  some diarrhea and stomach issue that normal last about 2 days after taking the Ozempic and then subsided. The pt want to confirm that this is a normal side effect. I informed him that diarrhea is normal, but normally improve after his body get adjusted to the medication. 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 He wishes to continue on the Ozempic. I encourage the patient if the symptoms persist or worsen to notify us. He verbalize understanding, no questions or concerns

## 2019-05-28 ENCOUNTER — Telehealth: Payer: Self-pay | Admitting: Family Medicine

## 2019-05-28 NOTE — Telephone Encounter (Signed)
Pt  Called said that he had pneumonia, shingle shot Flu  Shot  [address] [facility]  [place]

## 2019-06-13 ENCOUNTER — Telehealth: Payer: Self-pay | Admitting: Family Medicine

## 2019-06-13 DIAGNOSIS — E1165 Type 2 diabetes mellitus with hyperglycemia: Secondary | ICD-10-CM

## 2019-06-13 MED ORDER — OZEMPIC (0.25 OR 0.5 MG/DOSE) 2 MG/1.5ML ~~LOC~~ SOPN: 0.2500 mg | PEN_INJECTOR | SUBCUTANEOUS | 1 refills | Status: AC

## 2019-06-13 NOTE — Telephone Encounter (Signed)
Patient wants to take metformin once daily instead of twice daily changed in the chart, he will keep the log and call the office if it is not normal. He has follow up appointment in 07/2019.

## 2019-06-13 NOTE — Telephone Encounter (Signed)
Pt. Said that the  [place] did work for him so he is requesting a prescription called into  [facility] in [place].Also pt wanted to know if he still need to take metformin.

## 2019-06-13 NOTE — Telephone Encounter (Signed)
Please notify patient:  Ordered Ozempic rx for 90 day supply for him.  Yes - he should continue Metformin as prescribed for now. At future visit we can discuss reducing dose or coming off this med if needed.  Nobie Putnam, DO [facility] 06/13/2019, 1:42 PM

## 2019-06-24 LAB — HM DIABETES EYE EXAM

## 2019-07-01 ENCOUNTER — Encounter: Payer: Self-pay | Admitting: Family Medicine

## 2019-07-29 DIAGNOSIS — C61 Malignant neoplasm of prostate: Secondary | ICD-10-CM | POA: Diagnosis not present

## 2019-08-05 ENCOUNTER — Encounter: Payer: Self-pay | Admitting: Family Medicine

## 2019-08-05 ENCOUNTER — Ambulatory Visit (INDEPENDENT_AMBULATORY_CARE_PROVIDER_SITE_OTHER): Payer: BC Managed Care – PPO | Admitting: Family Medicine

## 2019-08-05 ENCOUNTER — Other Ambulatory Visit: Payer: Self-pay

## 2019-08-05 DIAGNOSIS — E1165 Type 2 diabetes mellitus with hyperglycemia: Secondary | ICD-10-CM | POA: Diagnosis not present

## 2019-08-05 NOTE — Patient Instructions (Signed)
AVS given by phone. 

## 2019-08-05 NOTE — Assessment & Plan Note (Addendum)
Improved A1c from prior to 8.5, now anticipated much further improvement on GLP1 new since last visit doing well with wt loss and glycemic control DUE for A1c now, will get next week  Complications - hyperglycemia, hyperlipidemia - increases risk of future cardiovascular complications  - Failed Jardiance d/t yeast infection, off in 2020  Plan:  1. Continue current therapy - Metformin 1000mg  BID, Ozempic 0.5mg  weekly inj (advised him to KEEP on 0.5 dose now instead of going back to 0.25 again , with some dosing confusion was restarting with each pen) 2. Encourage improved lifestyle - low carb, low sugar diet, reduce portion size, continue improving regular exercise 3. Check CBG , bring log to next visit for review 4. Continue ASA, ACEi - previously on Statin 5. Follow-up 3-4 months for DM A1c  Return 1 week for A1c now.

## 2019-08-05 NOTE — Progress Notes (Signed)
Virtual Visit via Telephone The purpose of this virtual visit is to provide medical care while limiting exposure to the novel coronavirus (COVID19) for both patient and office staff.  Consent was obtained for phone visit:  Yes.   Answered questions that patient had about telehealth interaction:  Yes.   I discussed the limitations, risks, security and privacy concerns of performing an evaluation and management service by telephone. I also discussed with the patient that there may be a patient responsible charge related to this service. The patient expressed understanding and agreed to proceed.  Patient Location: Home Provider Location: [facility])  ---------------------------------------------------------------------- Chief Complaint  Patient presents with  . Diabetes    pt would  like to get his A1C done on next Monday.     S: Reviewed CMA documentation. I have called patient and gathered additional HPI as follows:  CHRONIC DM, Type 2: Last A1c down to 8.5 (04/2019). He was initiated on Ozempic 0.25mg  at that time, given sample and new rx, since starting that he has had dramatic improvement by his report. He has lost weight, sugars improved, more energy and better sleep. He does admit going back to 0.25 at start of each pen for 4 weeks, and takes 0.5 for last 2 doses on pen. He started again 0.25 He has improved his diet, reduced sweets and snacks now. Meds:Metformin 1000mg  BID, Ozempic 0.25 to 0.5mg  weekly inj Reports good compliance. Tolerating well w/o side-effects Currently on ACEi Lifestyle: - Diet (tries to follow DM diet, improving) - Exercise (limited now, drives truck, previously more walking now less) Denies hypoglycemia, polyuria, visual changes, numbness or tingling.  Denies any high risk travel to areas of current concern for COVID19. Denies any known or suspected exposure to person with or possibly with COVID19.  Denies any fevers, chills,  sweats, body ache, cough, shortness of breath, sinus pain or pressure, headache, abdominal pain, diarrhea  Past Medical History:  Diagnosis Date  . Arthritis   . Cancer [facility]) 2009   Prostate, radiation but no surgery  . Elevated PSA   . History of prostate cancer   . Hyperlipidemia   . Hypertension    Social History   Tobacco Use  . Smoking status: Former Smoker    Packs/day: 0.50    Years: 35.00    Pack years: 17.50    Types: Cigarettes    Quit date: 11/14/1994    Years since quitting: 24.7  . Smokeless tobacco: Former Network engineer Use Topics  . Alcohol use: Yes    Alcohol/week: 6.0 standard drinks    Types: 3 Standard drinks or equivalent, 3 Cans of beer per week    Comment: occassional  . Drug use: No    Current Outpatient Medications:  .  diclofenac sodium (VOLTAREN) 1 % GEL, Apply 2 g topically 3 (three) times daily as needed. For thumb arthritis, Disp: 100 g, Rfl: 2 .  lisinopril-hydrochlorothiazide (ZESTORETIC) 10-12.5 MG tablet, Take 0.5 tablets by mouth daily., Disp: 90 tablet, Rfl: 3 .  metFORMIN (GLUCOPHAGE) 1000 MG tablet, Take 1 tablet (1,000 mg total) by mouth 2 (two) times daily with a meal. (Patient taking differently: Take 1,000 mg by mouth daily with breakfast. ), Disp: 180 tablet, Rfl: 3 .  Semaglutide,0.25 or 0.5MG /DOS, (OZEMPIC, 0.25 OR 0.5 MG/DOSE,) 2 MG/1.5ML SOPN, Inject 0.25 mg into the skin once a week. For first 4 weeks. Then increase dose to 0.5mg  weekly, Disp: 3 pen, Rfl: 1 .  tamsulosin (FLOMAX) 0.4  MG CAPS capsule, Take 1 capsule (0.4 mg total) by mouth daily after supper., Disp: 90 capsule, Rfl: 3 .  valACYclovir (VALTREX) 1000 MG tablet, Take 1 tablet (1,000 mg total) by mouth daily as needed (HSV flare). For up to 5-7 as needed for flare, can repeat., Disp: 21 tablet, Rfl: 1 .  aspirin EC 81 MG tablet, Take 81 mg by mouth daily., Disp: , Rfl:  .  Omega-3 Fatty Acids (FISH OIL) 1200 MG CAPS, Take 1 capsule by mouth 2 (two) times daily., Disp: ,  Rfl:   Depression screen [facility] 2/9 04/29/2019 03/11/2019  Decreased Interest 0 0  Down, Depressed, Hopeless 0 0  PHQ - 2 Score 0 0    No flowsheet data found.  -------------------------------------------------------------------------- O: No physical exam performed due to remote telephone encounter.  Lab results reviewed.  Recent Results (from the past 2160 hour(s))  HM DIABETES EYE EXAM     Status: None   Collection Time: 06/28/19 12:00 AM  Result Value Ref Range   HM Diabetic Eye Exam No Retinopathy No Retinopathy    -------------------------------------------------------------------------- A&P:  Problem List Items Addressed This Visit    Type 2 diabetes mellitus with hyperglycemia, without long-term current use of insulin (HCC) - Primary    Improved A1c from prior to 8.5, now anticipated much further improvement on GLP1 new since last visit doing well with wt loss and glycemic control DUE for A1c now, will get next week  Complications - hyperglycemia, hyperlipidemia - increases risk of future cardiovascular complications  - Failed Jardiance d/t yeast infection, off in 2020  Plan:  1. Continue current therapy - Metformin 1000mg  BID, Ozempic 0.5mg  weekly inj (advised him to KEEP on 0.5 dose now instead of going back to 0.25 again , with some dosing confusion was restarting with each pen) 2. Encourage improved lifestyle - low carb, low sugar diet, reduce portion size, continue improving regular exercise 3. Check CBG , bring log to next visit for review 4. Continue ASA, ACEi - previously on Statin 5. Follow-up 3-4 months for DM A1c  Return 1 week for A1c now.      Relevant Orders   [facility] - A1c LAB Hemoglobin A1C physical      No orders of the defined types were placed in this encounter.   Follow-up: - Return in 3-4 months for Type 2 Diabetes. - Future labs ordered for A1c next week 12/28  Patient verbalizes understanding with the above medical recommendations including  the limitation of remote medical advice.  Specific follow-up and call-back criteria were given for patient to follow-up or seek medical care more urgently if needed.   - Time spent in direct consultation with patient on phone: 9 minutes   Nobie Putnam, [facility] 08/05/2019, 8:24 AM

## 2019-08-12 ENCOUNTER — Other Ambulatory Visit: Payer: Self-pay

## 2019-08-12 DIAGNOSIS — E1165 Type 2 diabetes mellitus with hyperglycemia: Secondary | ICD-10-CM | POA: Diagnosis not present

## 2019-08-12 DIAGNOSIS — C61 Malignant neoplasm of prostate: Secondary | ICD-10-CM | POA: Diagnosis not present

## 2019-08-13 LAB — HEMOGLOBIN A1C
Hgb A1c MFr Bld: 6.9 % of total Hgb — ABNORMAL HIGH (ref <5.7)
Mean Plasma Glucose: 151 (calc)
eAG (mmol/L): 8.4 (calc)

## 2019-08-20 DIAGNOSIS — C61 Malignant neoplasm of prostate: Secondary | ICD-10-CM | POA: Diagnosis not present

## 2019-08-21 DIAGNOSIS — Z923 Personal history of irradiation: Secondary | ICD-10-CM | POA: Diagnosis not present

## 2019-08-21 DIAGNOSIS — Z8546 Personal history of malignant neoplasm of prostate: Secondary | ICD-10-CM | POA: Diagnosis not present

## 2019-08-21 DIAGNOSIS — Z08 Encounter for follow-up examination after completed treatment for malignant neoplasm: Secondary | ICD-10-CM | POA: Diagnosis not present

## 2019-08-21 DIAGNOSIS — R232 Flushing: Secondary | ICD-10-CM | POA: Diagnosis not present

## 2019-08-21 DIAGNOSIS — Z9221 Personal history of antineoplastic chemotherapy: Secondary | ICD-10-CM | POA: Diagnosis not present

## 2019-08-21 DIAGNOSIS — C61 Malignant neoplasm of prostate: Secondary | ICD-10-CM | POA: Diagnosis not present

## 2019-08-21 DIAGNOSIS — Z87891 Personal history of nicotine dependence: Secondary | ICD-10-CM | POA: Diagnosis not present

## 2019-08-21 DIAGNOSIS — Z9079 Acquired absence of other genital organ(s): Secondary | ICD-10-CM | POA: Diagnosis not present

## 2019-10-28 DIAGNOSIS — C61 Malignant neoplasm of prostate: Secondary | ICD-10-CM | POA: Diagnosis not present

## 2019-11-11 ENCOUNTER — Encounter: Payer: Self-pay | Admitting: Family Medicine

## 2019-11-11 ENCOUNTER — Other Ambulatory Visit: Payer: Self-pay

## 2019-11-11 ENCOUNTER — Ambulatory Visit (INDEPENDENT_AMBULATORY_CARE_PROVIDER_SITE_OTHER): Payer: BC Managed Care – PPO | Admitting: Family Medicine

## 2019-11-11 VITALS — BP 120/78 | HR 70 | Temp 97.7°F | Resp 16 | Ht 68.0 in | Wt 222.6 lb

## 2019-11-11 DIAGNOSIS — I10 Essential (primary) hypertension: Secondary | ICD-10-CM

## 2019-11-11 DIAGNOSIS — E66811 Obesity, class 1: Secondary | ICD-10-CM

## 2019-11-11 DIAGNOSIS — E669 Obesity, unspecified: Secondary | ICD-10-CM | POA: Diagnosis not present

## 2019-11-11 DIAGNOSIS — E1169 Type 2 diabetes mellitus with other specified complication: Secondary | ICD-10-CM

## 2019-11-11 DIAGNOSIS — Z1211 Encounter for screening for malignant neoplasm of colon: Secondary | ICD-10-CM

## 2019-11-11 DIAGNOSIS — G8929 Other chronic pain: Secondary | ICD-10-CM

## 2019-11-11 DIAGNOSIS — C61 Malignant neoplasm of prostate: Secondary | ICD-10-CM

## 2019-11-11 DIAGNOSIS — L723 Sebaceous cyst: Secondary | ICD-10-CM

## 2019-11-11 DIAGNOSIS — M549 Dorsalgia, unspecified: Secondary | ICD-10-CM

## 2019-11-11 DIAGNOSIS — E785 Hyperlipidemia, unspecified: Secondary | ICD-10-CM

## 2019-11-11 MED ORDER — OZEMPIC (1 MG/DOSE) 2 MG/1.5ML ~~LOC~~ SOPN: 1.0000 mg | PEN_INJECTOR | SUBCUTANEOUS | 5 refills | Status: DC

## 2019-11-11 MED ORDER — NAPROXEN 500 MG PO TABS: 500.0000 mg | ORAL_TABLET | Freq: Two times a day (BID) | ORAL | 2 refills | Status: DC

## 2019-11-11 NOTE — Assessment & Plan Note (Addendum)
Followed by [facility] Managed for prostate cancer, prior treatment/radiation Improved on last follow-up

## 2019-11-11 NOTE — Assessment & Plan Note (Signed)
Encourage weight loss lifestyle strategy changes 

## 2019-11-11 NOTE — Assessment & Plan Note (Addendum)
Overal well-controlled HTN, had mild elevated initially on R arm but manual repeat on L resolved - Home BP readings reviewed, normal  No known complications  Passed DOT physical    Plan:  1. Continue current BP regimen -  Lisinopril-HCTZ 10-12.5mg  (HALF pill) daily 2. Encourage improved lifestyle - low sodium diet, regular exercise 3. Continue monitor BP outside office, bring readings to next visit, if persistently >140/90 or new symptoms notify office sooner

## 2019-11-11 NOTE — Progress Notes (Signed)
Subjective:    Patient ID: Duane Grant, male    DOB: 08/19/1955, 64 y.o.   MRN: [id number]  Duane Grant is a 64 y.o. male presenting on 11/11/2019 for Diabetes   HPI   CHRONIC DM, Type 2: Hyperlipidemia Last A1c trend from 9 > 8 now last was 3 months ago 6.9 He is ready to inc ozempic from 0.5 to 1 Meds:Metformin 1000mg  AM ONLY w breakfast, Ozempic 0.5mg  weekly inj Reports good compliance. Tolerating well w/o side-effects Currently on ACEi Lifestyle:  - Improving Weight 222 lbs (home wt 217 lbs) - Diet (tries to follow DM diet, improving) - Exercise (limited now, drives truck, previously more walking now less) Denies hypoglycemia, polyuria, visual changes, numbness or tingling.  Prostate Cancer Followed by by [facility] - Dr Lawanna Kobus - S/p Radiation 2010, PSA was up to 19 in 08/2019, then treated with Trelstar (ADT)   CHRONIC HTN: Reports home BP readings 130 avg, had DOT physical recently passed Current Meds - Lisinopril-HCTZ 10-12.5mg  (HALF pill) daily    Reports good compliance, took meds today. Tolerating well, w/o complaints. Denies CP, dyspnea, HA, edema, dizziness / lightheadedness  Sebaceous Cyst on Back Large cyst in upper back middle region, he has had it drained before years ago was told it would come back, now asking today about this, has inc in size but non tender not draining. No fever chills nausea vomiting spreading redness  Health Maintenance: Due for COVID19 vaccine  Colon CA Screening: Never had colonoscopy - due for colon ca screening. Currently asymptomatic. No known family history of colon CA. Due for screening test considering Cologuard, counseling given - would like to proceed   Depression screen [facility] 2/9 04/29/2019 03/11/2019  Decreased Interest 0 0  Down, Depressed, Hopeless 0 0  PHQ - 2 Score 0 0    Social History   Tobacco Use  . Smoking status: Former Smoker    Packs/day: 0.50    Years: 35.00    Pack years: 17.50   Types: Cigarettes    Quit date: 11/14/1994    Years since quitting: 25.0  . Smokeless tobacco: Former Network engineer Use Topics  . Alcohol use: Yes    Alcohol/week: 6.0 standard drinks    Types: 3 Standard drinks or equivalent, 3 Cans of beer per week    Comment: occassional  . Drug use: No    Review of Systems Per HPI unless specifically indicated above     Objective:    BP 120/78 (BP Location: Left Arm, Cuff Size: Normal)   Pulse 70   Temp 97.7 F (36.5 C) (Temporal)   Resp 16   Ht 5\' 8"  (1.727 m)   Wt 222 lb 9.6 oz (101 kg)   BMI 33.85 kg/m   Wt Readings from Last 3 Encounters:  11/11/19 222 lb 9.6 oz (101 kg)  04/29/19 225 lb (102.1 kg)  03/11/19 226 lb (102.5 kg)    Physical Exam Vitals and nursing note reviewed.  Constitutional:      General: He is not in acute distress.    Appearance: He is well-developed. He is not diaphoretic.     Comments: Well-appearing, comfortable, cooperative  HENT:     Head: Normocephalic and atraumatic.  Eyes:     General:        Right eye: No discharge.        Left eye: No discharge.     Conjunctiva/sclera: Conjunctivae normal.  Neck:     Thyroid:  No thyromegaly.  Cardiovascular:     Rate and Rhythm: Normal rate and regular rhythm.     Heart sounds: Normal heart sounds. No murmur.  Pulmonary:     Effort: Pulmonary effort is normal. No respiratory distress.     Breath sounds: Normal breath sounds. No wheezing or rales.  Musculoskeletal:        General: Normal range of motion.     Cervical back: Normal range of motion and neck supple.     Comments: Low Back Inspection: Normal appearance, no spinal deformity, symmetrical. Palpation: No tenderness over spinous processes. Bilateral lumbar paraspinal muscles non-tender and some mild hypertonicity/spasm. ROM: Full active ROM forward flex / back extension, rotation L/R without discomfort Special Testing: Seated SLR negative for radicular pain bilaterally  Strength: Bilateral hip  flex/ext 5/5, knee flex/ext 5/5, ankle dorsiflex/plantarflex 5/5 Neurovascular: intact distal sensation to light touch   Lymphadenopathy:     Cervical: No cervical adenopathy.  Skin:    General: Skin is warm and dry.     Findings: No erythema or rash.     Comments: Upper mid back about scapular region of thoracic spine, large 3-4 cm swollen indurated area over midline spine consistent with large cyst, no erythema, non tender, has central pore but not draining.  Neurological:     Mental Status: He is alert and oriented to person, place, and time.  Psychiatric:        Behavior: Behavior normal.     Comments: Well groomed, good eye contact, normal speech and thoughts      Recent Labs    04/29/19 0949 08/12/19 0920  HGBA1C 8.5* 6.9*     Results for orders placed or performed in visit on 08/12/19  [place] - A1c LAB Hemoglobin A1C physical  Result Value Ref Range   Hgb A1c MFr Bld 6.9 (H) <5.7 % of total Hgb   Mean Plasma Glucose 151 (calc)   eAG (mmol/L) 8.4 (calc)      Assessment & Plan:   Problem List Items Addressed This Visit    Type 2 diabetes mellitus with other specified complication ([place]) - Primary    Significantly improved in past on ozempic A1c 8-9 down to 6.9 Weight loss on GLP1 Due for lab A1c pending now Complications - hyperglycemia, hyperlipidemia - increases risk of future cardiovascular complications  - Failed Jardiance d/t yeast infection, off in 2020  Plan:  1. Increase Ozempic from 0.5mg  weekly inj to 1mg  weekly inj, new order 2. Reduce Metformin from 1000 BID down to 1000mg  daily and in future can consider stop 2. Encourage improved lifestyle - low carb, low sugar diet, reduce portion size, continue improving regular exercise 3. Check CBG , bring log to next visit for review 4. Continue ASA, ACEi - previously on Statin 5. Follow-up 6 months for DM A1c      Relevant Medications   OZEMPIC, 1 MG/DOSE, 2 MG/1.5ML SOPN   Other Relevant Orders    Hemoglobin A1c   COMPLETE METABOLIC PANEL WITH GFR   Lipid panel   Prostate cancer ([place])    Followed by [facility] Managed for prostate cancer, prior treatment/radiation Improved on last follow-up      Relevant Orders   CBC with Differential/Platelet   Obesity (BMI 30.0-34.9)    Encourage weight loss lifestyle strategy changes      Relevant Orders   COMPLETE METABOLIC PANEL WITH GFR   Lipid panel   Hyperlipidemia associated with type 2 diabetes mellitus ([place])  History of HLD, last 10/2018 Not on statin Check labs w TSH and Cholesterol       Relevant Medications   OZEMPIC, 1 MG/DOSE, 2 MG/1.5ML SOPN   Other Relevant Orders   Lipid panel   TSH   Essential hypertension    Overal well-controlled HTN, had mild elevated initially on R arm but manual repeat on L resolved - Home BP readings reviewed, normal  No known complications  Passed DOT physical    Plan:  1. Continue current BP regimen -  Lisinopril-HCTZ 10-12.5mg  (HALF pill) daily 2. Encourage improved lifestyle - low sodium diet, regular exercise 3. Continue monitor BP outside office, bring readings to next visit, if persistently >140/90 or new symptoms notify office sooner      Relevant Orders   CBC with Differential/Platelet   COMPLETE METABOLIC PANEL WITH GFR    Other Visit Diagnoses    Screening for colon cancer       Relevant Orders   Cologuard   Inflamed sebaceous cyst       Relevant Orders   Ambulatory referral to General Surgery   Chronic mid back pain       Relevant Medications   naproxen (NAPROSYN) 500 MG tablet      #Cyst History and exam consistent with inflamed sebaceous cyst without secondary infection, midline upper back over spine, large size and recurrence known. Will refer to Gen Surgery for removal excision  #Chronic back pain Episodic, currently some mild back pain flare up affecting him on occasion Will re order a Naproxen NSAID 500 BID PRN Caution w muscle relaxants  avoid sedation grogginess  DOT physical completed, will follow up yearly physical in future if indicated otherwise can do yearly check up w labs  Orders Placed This Encounter  Procedures  . Hemoglobin A1c  . CBC with Differential/Platelet  . COMPLETE METABOLIC PANEL WITH GFR  . Lipid panel    Order Specific Question:   Has the patient fasted?    Answer:   Yes  . Cologuard  . TSH  . Ambulatory referral to General Surgery    Referral Priority:   Routine    Referral Type:   Surgical    Referral Reason:   Specialty Services Required    Requested Specialty:   General Surgery    Number of Visits Requested:   1     Meds ordered this encounter  Medications  . OZEMPIC, 1 MG/DOSE, 2 MG/1.5ML SOPN    Sig: Inject 1 mg into the skin once a week.    Dispense:  2 pen    Refill:  5    Increase dose to 1mg  weekly, dispense appropriate amount for 1 month  . naproxen (NAPROSYN) 500 MG tablet    Sig: Take 1 tablet (500 mg total) by mouth 2 (two) times daily with a meal. As needed    Dispense:  60 tablet    Refill:  2    Follow up plan: Return in about 6 months (around 05/13/2020) for 6 month follow-up DM A1c.    Nobie Putnam, DO [facility] 11/11/2019, 8:16 AM

## 2019-11-11 NOTE — Assessment & Plan Note (Signed)
History of HLD, last 10/2018 Not on statin Check labs w TSH and Cholesterol

## 2019-11-11 NOTE — Patient Instructions (Addendum)
Thank you for coming to the office today.  Increase Ozempic from 0.5 to 1mg   Referral to Surgeon  Recommend trial of Anti-inflammatory with Naproxen (Naprosyn) 500mg  tabs - take one with food and plenty of water TWICE daily every day (breakfast and dinner), for next 2 to 4 weeks, then you may take only as needed - DO NOT TAKE any ibuprofen, aleve, motrin while you are taking this medicine - It is safe to take Tylenol Ext Str 500mg  tabs - take 1 to 2 (max dose 1000mg ) every 6 hours as needed for breakthrough pain, max 24 hour daily dose is 6 to 8 tablets or 4000mg    Also contact [facility] to see if they can get you scheduled.  [place]:  [facility]) [place] [address]  Hours: Monday - Sunday 8:00am to 12:00pm  COVID-19 Vaccines By Appointment Only  Sign up for [place] List  [email or web address] or text "VACCINE" to [phone] or call [phone]   Please schedule a Follow-up Appointment to: Return in about 6 months (around 05/13/2020) for 6 month follow-up DM A1c.  If you have any other questions or concerns, please feel free to call the office or send a message through [place]. You may also schedule an earlier appointment if necessary.  Additionally, you may be receiving a survey about your experience at our office within a few days to 1 week by e-mail or mail. We value your feedback.  Nobie Putnam, DO [place]

## 2019-11-11 NOTE — Assessment & Plan Note (Signed)
Significantly improved in past on ozempic A1c 8-9 down to 6.9 Weight loss on GLP1 Due for lab A1c pending now Complications - hyperglycemia, hyperlipidemia - increases risk of future cardiovascular complications  - Failed Jardiance d/t yeast infection, off in 2020  Plan:  1. Increase Ozempic from 0.5mg  weekly inj to 1mg  weekly inj, new order 2. Reduce Metformin from 1000 BID down to 1000mg  daily and in future can consider stop 2. Encourage improved lifestyle - low carb, low sugar diet, reduce portion size, continue improving regular exercise 3. Check CBG , bring log to next visit for review 4. Continue ASA, ACEi - previously on Statin 5. Follow-up 6 months for DM A1c

## 2019-11-12 LAB — CBC WITH DIFFERENTIAL/PLATELET
Absolute Monocytes: 519 cells/uL (ref 200–950)
Basophils Absolute: 32 cells/uL (ref 0–200)
Basophils Relative: 0.6 %
Eosinophils Absolute: 69 cells/uL (ref 15–500)
Eosinophils Relative: 1.3 %
HCT: 38.3 % — ABNORMAL LOW (ref 38.5–50.0)
Hemoglobin: 13.1 g/dL — ABNORMAL LOW (ref 13.2–17.1)
Lymphs Abs: 890 cells/uL (ref 850–3900)
MCH: 32.7 pg (ref 27.0–33.0)
MCHC: 34.2 g/dL (ref 32.0–36.0)
MCV: 95.5 fL (ref 80.0–100.0)
MPV: 10.1 fL (ref 7.5–12.5)
Monocytes Relative: 9.8 %
Neutro Abs: 3790 cells/uL (ref 1500–7800)
Neutrophils Relative %: 71.5 %
Platelets: 297 10*3/uL (ref 140–400)
RBC: 4.01 10*6/uL — ABNORMAL LOW (ref 4.20–5.80)
RDW: 11.9 % (ref 11.0–15.0)
Total Lymphocyte: 16.8 %
WBC: 5.3 10*3/uL (ref 3.8–10.8)

## 2019-11-12 LAB — COMPLETE METABOLIC PANEL WITH GFR
AG Ratio: 1.6 (calc) (ref 1.0–2.5)
ALT: 17 U/L (ref 9–46)
AST: 15 U/L (ref 10–35)
Albumin: 3.9 g/dL (ref 3.6–5.1)
Alkaline phosphatase (APISO): 51 U/L (ref 35–144)
BUN: 16 mg/dL (ref 7–25)
CO2: 24 mmol/L (ref 20–32)
Calcium: 9.1 mg/dL (ref 8.6–10.3)
Chloride: 105 mmol/L (ref 98–110)
Creat: 0.88 mg/dL (ref 0.70–1.25)
GFR, Est African American: 104 mL/min/{1.73_m2} (ref >=60)
GFR, Est Non African American: 90 mL/min/{1.73_m2} (ref >=60)
Globulin: 2.5 g/dL (calc) (ref 1.9–3.7)
Glucose, Bld: 146 mg/dL — ABNORMAL HIGH (ref 65–99)
Potassium: 4.5 mmol/L (ref 3.5–5.3)
Sodium: 136 mmol/L (ref 135–146)
Total Bilirubin: 0.5 mg/dL (ref 0.2–1.2)
Total Protein: 6.4 g/dL (ref 6.1–8.1)

## 2019-11-12 LAB — LIPID PANEL
Cholesterol: 181 mg/dL (ref <200)
HDL: 54 mg/dL (ref >=40)
LDL Cholesterol (Calc): 104 mg/dL (calc) — ABNORMAL HIGH
Non-HDL Cholesterol (Calc): 127 mg/dL (calc) (ref <130)
Total CHOL/HDL Ratio: 3.4 (calc) (ref <5.0)
Triglycerides: 125 mg/dL (ref <150)

## 2019-11-12 LAB — TSH: TSH: 0.84 mIU/L (ref 0.40–4.50)

## 2019-11-12 LAB — HEMOGLOBIN A1C
Hgb A1c MFr Bld: 6.9 % of total Hgb — ABNORMAL HIGH (ref <5.7)
Mean Plasma Glucose: 151 (calc)
eAG (mmol/L): 8.4 (calc)

## 2019-11-18 DIAGNOSIS — Z1212 Encounter for screening for malignant neoplasm of rectum: Secondary | ICD-10-CM | POA: Diagnosis not present

## 2019-11-18 DIAGNOSIS — Z1211 Encounter for screening for malignant neoplasm of colon: Secondary | ICD-10-CM | POA: Diagnosis not present

## 2019-11-19 LAB — COLOGUARD: Cologuard: NEGATIVE

## 2019-11-20 LAB — COLOGUARD: COLOGUARD: NEGATIVE

## 2019-11-21 ENCOUNTER — Encounter: Payer: Self-pay | Admitting: Family Medicine

## 2019-11-25 ENCOUNTER — Ambulatory Visit (INDEPENDENT_AMBULATORY_CARE_PROVIDER_SITE_OTHER): Payer: BC Managed Care – PPO | Admitting: Surgery

## 2019-11-25 ENCOUNTER — Encounter: Payer: Self-pay | Admitting: Surgery

## 2019-11-25 ENCOUNTER — Other Ambulatory Visit: Payer: Self-pay

## 2019-11-25 VITALS — BP 131/77 | HR 77 | Temp 97.2°F | Resp 12 | Ht 68.0 in | Wt 217.0 lb

## 2019-11-25 DIAGNOSIS — L723 Sebaceous cyst: Secondary | ICD-10-CM

## 2019-11-25 DIAGNOSIS — L720 Epidermal cyst: Secondary | ICD-10-CM | POA: Diagnosis not present

## 2019-11-25 MED ORDER — HYDROCODONE-ACETAMINOPHEN 5-325 MG PO TABS: 1.0000 | ORAL_TABLET | Freq: Four times a day (QID) | ORAL | 0 refills | Status: DC | PRN

## 2019-11-25 NOTE — Patient Instructions (Signed)
We have removed the Cyst of your upper back in our office today.  You have sutures under the skin that will dissolve and also dermabond (skin glue) on top of your skin which will come off on it's own in 10-14 days.  You may shower tomorrow as usual. You may take Tylenol and Ibuprofen for pain as needed. You may also place an ice pack on the area to help relieve pain and swelling.   Avoid Strenuous activities that will make you sweat during the next 48 hours to avoid the glue coming off prematurely. Avoid activities that will place pressure to this area of the body for 1-2 weeks to avoid re-injury to incision site.  Please see your follow-up appointment provided. We will see you back in office to make sure this area is healed. If you have any questions or concerns prior to this appointment, call our office and speak with a nurse.  Excision of Skin Cysts or Lesions Excision of a skin lesion refers to the removal of a section of skin by making small cuts (incisions) in the skin. This procedure may be done to remove a cancerous (malignant) or noncancerous (benign) growth on the skin. It is typically done to treat or prevent cancer or infection. It may also be done to improve cosmetic appearance. The procedure may be done to remove:  Cancerous growths, such as basal cell carcinoma, squamous cell carcinoma, or melanoma.  Noncancerous growths, such as a cyst or lipoma.  Growths, such as moles or skin tags, which may be removed for cosmetic reasons.  Various excision or surgical techniques may be used depending on your condition, the location of the lesion, and your overall health. Tell a health care provider about:  Any allergies you have.  All medicines you are taking, including vitamins, herbs, eye drops, creams, and over-the-counter medicines.  Any problems you or family members have had with anesthetic medicines.  Any blood disorders you have.  Any surgeries you have had.  Any medical  conditions you have.  Whether you are pregnant or may be pregnant. What are the risks? Generally, this is a safe procedure. However, problems may occur, including:  Bleeding.  Infection.  Scarring.  Recurrence of the cyst, lipoma, or cancer.  Changes in skin sensation or appearance, such as discoloration or swelling.  Reaction to the anesthetics.  Allergic reaction to surgical materials or ointments.  Damage to nerves, blood vessels, muscles, or other structures.  Continued pain.  What happens before the procedure?  Ask your health care provider about: ? Changing or stopping your regular medicines. This is especially important if you are taking diabetes medicines or blood thinners. ? Taking medicines such as aspirin and ibuprofen. These medicines can thin your blood. Do not take these medicines before your procedure if your health care provider instructs you not to.  You may be asked to take certain medicines.  You may be asked to stop smoking.  You may have an exam or testing.  Plan to have someone take you home after the procedure.  Plan to have someone help you with activities during recovery. What happens during the procedure?  To reduce your risk of infection: ? Your health care team will wash or sanitize their hands. ? Your skin will be washed with soap.  You will be given a medicine to numb the area (local anesthetic).  One of the following excision techniques will be performed.  At the end of any of these procedures, antibiotic ointment  will be applied as needed. Each of the following techniques may vary among health care providers and hospitals. Complete Surgical Excision The area of skin that needs to be removed will be marked with a pen. Using a small scalpel or scissors, the surgeon will gently cut around and under the lesion until it is completely removed. The lesion will be placed in a fluid and sent to the lab for examination. If necessary, bleeding  will be controlled with a device that delivers heat (electrocautery). The edges of the wound may be stitched (sutured) together, and a bandage (dressing) will be applied. This procedure may be performed to treat a cancerous growth or a noncancerous cyst or lesion. Excision of a Cyst The surgeon will make an incision on the cyst. The entire cyst will be removed through the incision. The incision may be closed with sutures. Shave Excision During shave excision, the surgeon will use a small blade or an electrically heated loop instrument to shave off the lesion. This may be done to remove a mole or a skin tag. The wound will usually be left to heal on its own without sutures. Punch Excision During punch excision, the surgeon will use a small tool that is like a cookie cutter or a hole punch to cut a circle shape out of the skin. The outer edges of the skin will be sutured together. This may be done to remove a mole or a scar or to perform a biopsy of the lesion. Mohs Micrographic Surgery During Mohs micrographic surgery, layers of the lesion will be removed with a scalpel or a loop instrument and will be examined right away under a microscope. Layers will be removed until all of the abnormal or cancerous tissue has been removed. This procedure is minimally invasive, and it ensures the best cosmetic outcome. It involves the removal of as little normal tissue as possible. Mohs is usually done to treat skin cancer, such as basal cell carcinoma or squamous cell carcinoma, particularly on the face and ears. Depending on the size of the surgical wound, it may be sutured closed. What happens after the procedure?  Return to your normal activities as told by your health care provider.  Talk with your health care provider to discuss any test results, treatment options, and if necessary, the need for more tests. This information is not intended to replace advice given to you by your health care provider. Make sure  you discuss any questions you have with your health care provider. Document Released: 10/26/2009 Document Revised: 01/07/2016 Document Reviewed: 09/17/2014 Elsevier Interactive Patient Education  [employer].

## 2019-11-25 NOTE — Progress Notes (Signed)
11/25/2019  Reason for Visit:  Upper back sebaceous cyst  Referring Provider:  Nobie Putnam, DO  History of Present Illness: Duane Grant is a 64 y.o. male presenting for evaluation of an upper back sebaceous cyst.  Patient reports that he used to have a cyst in that area and had an I&D procedure done in 2011 or 2012 because he had gotten infected.  He was told by the surgeon at that point that most likely will come back in the future but did not specify when exactly.  The patient noted that about a year ago it started growing again and has become larger in size.  The patient reports that there is some discomfort when pushing on it but otherwise denies any redness of the skin, drainage of any fluid, fevers, chills.  Denies any tingliness or issues down his spine.  He does report that it itches a little bit.  Past Medical History: Past Medical History:  Diagnosis Date  . Arthritis   . Cancer [facility]) 2009   Prostate, radiation but no surgery  . Diabetes mellitus without complication ([place])   . Elevated PSA   . History of prostate cancer   . Hyperlipidemia   . Hypertension      Past Surgical History: Past Surgical History:  Procedure Laterality Date  . HERNIA REPAIR  [id number]   Umbilical  . HERNIA REPAIR  2010  . Removal Fatty Tumor Right 2015   [facility], right side of chin/jaw  . 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 Authorizing Provider  diclofenac sodium (VOLTAREN) 1 % GEL Apply 2 g topically 3 (three) times daily as needed. For thumb arthritis 03/11/19  Yes Karamalegos, Devonne Doughty, DO  lisinopril-hydrochlorothiazide (ZESTORETIC) 10-12.5 MG tablet Take 0.5 tablets by mouth daily. 04/29/19  Yes Karamalegos, Devonne Doughty, DO  metFORMIN  (GLUCOPHAGE) 1000 MG tablet Take 1 tablet (1,000 mg total) by mouth 2 (two) times daily with a meal. Patient taking differently: Take 1,000 mg by mouth daily with breakfast.  04/29/19  Yes Karamalegos, Devonne Doughty, DO  naproxen (NAPROSYN) 500 MG tablet Take 1 tablet (500 mg total) by mouth 2 (two) times daily with a meal. As needed 11/11/19  Yes Karamalegos, Devonne Doughty, DO  Omega-3 Fatty Acids (FISH OIL) 1200 MG CAPS Take 1 capsule by mouth 2 (two) times daily.   Yes [provider]  OZEMPIC, 1 MG/DOSE, 2 MG/1.5ML SOPN Inject 1 mg into the skin once a week. 11/11/19  Yes Karamalegos, Devonne Doughty, DO  tamsulosin (FLOMAX) 0.4 MG CAPS capsule Take 1 capsule (0.4 mg total) by mouth daily after supper. 04/29/19  Yes Karamalegos, Devonne Doughty, DO  valACYclovir (VALTREX) 1000 MG tablet Take 1 tablet (1,000 mg total) by mouth daily as needed (HSV flare). For up to 5-7 as needed for flare, can repeat. 03/11/19  Yes Karamalegos, Devonne Doughty, DO    Allergies: No Known Allergies  Social History:  reports that he quit smoking about 25 years ago. His smoking use included cigarettes. He has a 17.50 pack-year smoking history. He has quit using smokeless tobacco. He reports current alcohol use of about 6.0 standard drinks of alcohol per week. He reports that he does not use drugs.   Family History: Family History  Problem Relation Age of  Onset  . Diabetes Mother   . Hypertension Mother     Review of Systems: Review of Systems  Constitutional: Negative for chills and fever.  HENT: Negative for hearing loss.   Respiratory: Negative for shortness of breath.   Cardiovascular: Negative for chest pain.  Gastrointestinal: Negative for abdominal pain, nausea and vomiting.  Genitourinary: Negative for dysuria.  Musculoskeletal: Negative for myalgias.  Skin: Positive for itching.       Cyst of upper back  Neurological: Negative for dizziness.  Psychiatric/Behavioral: Negative for depression.     Physical Exam BP 131/77   Pulse 77   Temp (!) 97.2 F (36.2 C)   Resp 12   Ht 5\' 8"  (1.727 m)   Wt 217 lb (98.4 kg)   SpO2 97%   BMI 32.99 kg/m  CONSTITUTIONAL: No acute distress HEENT:  Normocephalic, atraumatic, extraocular motion intact. NECK: Trachea is midline, and there is no jugular venous distension.  RESPIRATORY:  Lungs are clear, and breath sounds are equal bilaterally. Normal respiratory effort without pathologic use of accessory muscles. CARDIOVASCULAR: Heart is regular without murmurs, gallops, or rubs. GI: The abdomen is soft, nondistended, nontender.  MUSCULOSKELETAL:  Normal muscle strength and tone in all four extremities.  No peripheral edema or cyanosis. SKIN: The patient has a 3 to 3-1/2 cm sebaceous cyst at the midline portion of the upper back.  There is blackhead pore marking the entry point for the cyst.  There is no drainage, no erythema, no induration and only some mild discomfort when pushing on the cyst. NEUROLOGIC:  Motor and sensation is grossly normal.  Cranial nerves are grossly intact. PSYCH:  Alert and oriented to person, place and time. Affect is normal.  Laboratory Analysis: No results found for this or any previous visit (from the past 24 hour(s)).  Imaging: No results found.  Assessment and Plan: This is a 64 y.o. male with an upper back sebaceous cyst status post prior I&D about 10 years ago.  -Discussed with the patient that at this point the cyst is not infected but given that it is somewhat symptomatic with the discomfort he is having, is very reasonable to proceed with excision of the cyst.  Discussed with the patient the risks of bleeding, infection, injury to surrounding structures.  Discussed with him the postoperative restrictions of no strenuous activity such as twisting of the back or bending forward to stretch the back do not put any stress on the incision for about 2 weeks.  We are able to proceed with this procedure today during  this appointment and the patient is agreeable with this.    Procedure Date:  11/25/2019  Pre-operative Diagnosis:  Upper back sebaceous cyst  Post-operative Diagnosis:  Upper back sebaceous cyst  Procedure:  Excision of upper back sebaceous cyst  Surgeon:  Melvyn Neth, MD  Anesthesia:  8 ml 1% lidocaine with epi  Estimated Blood Loss:  3 ml  Specimens:  Sebaceous cyst  Complications:  None  Indications for Procedure:  This is a 64 y.o. male with diagnosis of an upper back sebaceous cyst, with some discomfort, that's been growing for about a year.  The patient wishes to have this excised. The risks of bleeding, abscess or infection, injury to surrounding structures, and need for further procedures were all discussed with the patient and he was willing to proceed.  Description of Procedure: The patient was correctly identified at bedside.  The patient was placed prone on the procedure bed.  Appropriate  time-outs were performed.  The patient's upper back was prepped and draped in usual sterile fashion.  Local anesthetic was infused.  A 3.2 cm elliptical incision was made over the cyst, including it's entry pore, and scalpel was used for sharp dissection down the skin.  Skin flaps were created sharply with the scalpel as well, and then the cyst was excised, intact.  It was sent off to pathology.  The cavity was then irrigated and hemostasis was assured, needing one suture over the subcutaneous tissue.  The wound was then closed in two layers using 2-0 Vicryl and 4-0 Monocryl.  The incision was cleaned and sealed with DermaBond.  The patient tolerated the procedure well and all sharps were appropriately disposed of at the end of the case.   --Follow up 1 one week for wound check --May take Tylenol, Ibuprofen, and will send a few Vicodin as a precaution for pain control. --Avoid strenuous activity    Melvyn Neth, [facility]

## 2019-12-02 ENCOUNTER — Encounter: Payer: Self-pay | Admitting: Surgery

## 2019-12-02 ENCOUNTER — Other Ambulatory Visit: Payer: Self-pay

## 2019-12-02 ENCOUNTER — Ambulatory Visit (INDEPENDENT_AMBULATORY_CARE_PROVIDER_SITE_OTHER): Payer: Self-pay | Admitting: Surgery

## 2019-12-02 VITALS — BP 130/82 | HR 69 | Temp 97.8°F | Resp 12 | Ht 68.0 in | Wt 217.0 lb

## 2019-12-02 DIAGNOSIS — L723 Sebaceous cyst: Secondary | ICD-10-CM

## 2019-12-02 DIAGNOSIS — Z9889 Other specified postprocedural states: Secondary | ICD-10-CM

## 2019-12-02 DIAGNOSIS — Z09 Encounter for follow-up examination after completed treatment for conditions other than malignant neoplasm: Secondary | ICD-10-CM

## 2019-12-02 HISTORY — DX: Other specified postprocedural states: Z98.890

## 2019-12-02 NOTE — Progress Notes (Signed)
12/02/2019  HPI: Duane Grant is a 64 y.o. male s/p excision of upper back sebaceous cyst on 11/25/19.  He presents for follow up.  Reports doing very well.  Denies any worsening pain, redness of the incision, or drainage.    Vital signs: BP 130/82   Pulse 69   Temp 97.8 F (36.6 C)   Resp 12   Ht 5\' 8"  (1.727 m)   Wt 217 lb (98.4 kg)   SpO2 99%   BMI 32.99 kg/m    Physical Exam: Constitutional: No acute distress. Skin:  Incision in upper back is clean, dry, intact, healing well with DermaBond in place.  Some firmness palpable at the incision consistent with scar tissue.  Assessment/Plan: This is a 64 y.o. male s/p excision of upper back cyst.  --Discussed with patient that he's healing very well.  One more week of decreased activity to allow for better healing, and then can resume. --His pathology report had not shown on his chart yet, so [name] faxed [place] the results, which are attached to his note.  Benign sebaceous cyst. --Follow up prn.   Melvyn Neth, [facility]

## 2019-12-02 NOTE — Patient Instructions (Addendum)
Follow up as needed, call the office if you have any questions or concerns.   Epidermal Cyst Removal, Care After This sheet gives you information about how to care for yourself after your procedure. Your health care provider may also give you more specific instructions. If you have problems or questions, contact your health care provider. What can I expect after the procedure? After the procedure, it is common to have:  Soreness in the area where your cyst was removed.  Tightness or itchiness from the stitches (sutures) in your skin. Follow these instructions at home: Medicines  Take over-the-counter and prescription medicines only as told by your health care provider.  If you were prescribed an antibiotic medicine or ointment, take or apply it as told by your health care provider. Do not stop using the antibiotic even if you start to feel better. Incision care   Follow instructions from your health care provider about how to take care of your incision. Make sure you: ? Wash your hands with soap and water before you change your bandage (dressing). If soap and water are not available, use hand sanitizer. ? Change your dressing as told by your health care provider. ? Leave sutures, skin glue, or adhesive strips in place. These skin closures may need to stay in place for 1-2 weeks or longer. If adhesive strip edges start to loosen and curl up, you may trim the loose edges. Do not remove adhesive strips completely unless your health care provider tells you to do that.  Keep the dressingdry until your health care provider says that it can be removed.  After your dressing is off, check your incision area every day for signs of infection. Check for: ? Redness, swelling, or pain. ? Fluid or blood. ? Warmth. ? Pus or a bad smell. General instructions  Do not take baths, swim, or use a hot tub until your health care provider approves. Ask your health care provider if you may take showers.  You may only be allowed to take sponge baths.  Your health care provider may ask you to avoid contact sports or activities that take a lot of effort. Do not do anything that stretches or puts pressure on your incision.  You can return to your normal diet.  Keep all follow-up visits as told by your health care provider. This is important. Contact a health care provider if:  You have a fever.  You have redness, swelling, or pain in the incision area.  You have fluid or blood coming from your incision.  You have pus or a bad smell coming from your incision.  Your incision feels warm to the touch.  Your cyst grows back. Summary  After the procedure, it is common to have soreness in the area where your cyst was removed.  Take or apply over-the-counter and prescription medicines only as told by your health care provider.  Follow instructions from your health care provider about how to take care of your incision. This information is not intended to replace advice given to you by your health care provider. Make sure you discuss any questions you have with your health care provider. Document Revised: 11/21/2017 Document Reviewed: 05/25/2017 Elsevier Patient Education  [place].

## 2019-12-17 ENCOUNTER — Ambulatory Visit (INDEPENDENT_AMBULATORY_CARE_PROVIDER_SITE_OTHER): Payer: BC Managed Care – PPO

## 2019-12-17 VITALS — BP 131/81 | Wt 215.0 lb

## 2019-12-17 DIAGNOSIS — Z0000 Encounter for general adult medical examination without abnormal findings: Secondary | ICD-10-CM | POA: Diagnosis not present

## 2019-12-17 NOTE — Progress Notes (Signed)
Subjective:   Duane Grant is a 64 y.o. male who presents for an Initial Medicare Annual Wellness Visit.  This visit is being conducted via phone call  - after an attmept to do on video chat - due to the COVID-19 pandemic. This patient has given me verbal consent via phone to conduct this visit, patient states they are participating from their home address. Some vital signs may be absent or patient reported.   Patient identification: identified by name, DOB, and current address.    Review of Systems   Cardiac Risk Factors include: advanced age (>27men, >62 women);male gender;hypertension;dyslipidemia    Objective:    Today's Vitals   12/17/19 0954  BP: 131/81  Weight: 215 lb (97.5 kg)   Body mass index is 32.69 kg/m.  Advanced Directives 12/17/2019 08/25/2016 06/12/2016 06/09/2016 06/09/2016 06/08/2016  Does Patient Have a Medical Advance Directive? No No No No No No  Would patient like information on creating a medical advance directive? - - No - patient declined information No - patient declined information - No - patient declined information    Current Medications (verified) Outpatient Encounter Medications as of 12/17/2019  Medication Sig  . diclofenac sodium (VOLTAREN) 1 % GEL Apply 2 g topically 3 (three) times daily as needed. For thumb arthritis  . lisinopril-hydrochlorothiazide (ZESTORETIC) 10-12.5 MG tablet Take 0.5 tablets by mouth daily.  . metFORMIN (GLUCOPHAGE) 1000 MG tablet Take 1 tablet (1,000 mg total) by mouth 2 (two) times daily with a meal. (Patient taking differently: Take 1,000 mg by mouth daily with breakfast. )  . naproxen (NAPROSYN) 500 MG tablet Take 1 tablet (500 mg total) by mouth 2 (two) times daily with a meal. As needed  . Omega-3 Fatty Acids (FISH OIL) 1200 MG CAPS Take 1 capsule by mouth 2 (two) times daily.  Marland Kitchen OZEMPIC, 1 MG/DOSE, 2 MG/1.5ML SOPN Inject 1 mg into the skin once a week.  . tamsulosin (FLOMAX) 0.4 MG CAPS capsule Take 1 capsule (0.4  mg total) by mouth daily after supper.  . valACYclovir (VALTREX) 1000 MG tablet Take 1 tablet (1,000 mg total) by mouth daily as needed (HSV flare). For up to 5-7 as needed for flare, can repeat. (Patient not taking: Reported on 12/17/2019)   No facility-administered encounter medications on file as of 12/17/2019.    Allergies (verified) Patient has no known allergies.   History: Past Medical History:  Diagnosis Date  . Arthritis   . Cancer [facility]) 2009   Prostate, radiation but no surgery  . Diabetes mellitus without complication ([place])   . Elevated PSA   . H/O removal of cyst 12/02/2019   back   . History of prostate cancer   . Hyperlipidemia   . Hypertension    Past Surgical History:  Procedure Laterality Date  . HERNIA REPAIR  [id number]   Umbilical  . HERNIA REPAIR  2010  . Removal Fatty Tumor Right 2015   [facility], right side of chin/jaw  . TOTAL HIP ARTHROPLASTY Left 06/09/2016   Procedure: TOTAL HIP ARTHROPLASTY;  Surgeon: Thornton Park, MD;  Location: [facility] ORS;  Service: Orthopedics;  Laterality: Left;   Family History  Problem Relation Age of Onset  . Diabetes Mother   . Hypertension Mother    Social History   Socioeconomic History  . Marital status: Married    Spouse name: Not on file  . Number of children: Not on file  . Years of education: [employer]  . Highest education  level: High school graduate  Occupational History  . Not on file  Tobacco Use  . Smoking status: Former Smoker    Packs/day: 0.50    Years: 35.00    Pack years: 17.50    Types: Cigarettes    Quit date: 11/14/1994    Years since quitting: 25.1  . Smokeless tobacco: Former Network engineer and Sexual Activity  . Alcohol use: Yes    Alcohol/week: 6.0 standard drinks    Types: 3 Cans of beer, 3 Standard drinks or equivalent per week    Comment: occassional  . Drug use: No  . Sexual activity: Yes    Birth control/protection: None  Other Topics Concern  . Not on file  Social  History Narrative  . Not on file   Social Determinants of Health   Financial Resource Strain: Low Risk   . Difficulty of Paying Living Expenses: Not hard at all  Food Insecurity: No Food Insecurity  . Worried About Charity fundraiser in the Last Year: Never true  . Ran Out of Food in the Last Year: Never true  Transportation Needs: No Transportation Needs  . Lack of Transportation (Medical): No  . Lack of Transportation (Non-Medical): No  Physical Activity: Insufficiently Active  . Days of Exercise per Week: 3 days  . Minutes of Exercise per Session: 30 min  Stress:   . Feeling of Stress :   Social Connections: Somewhat Isolated  . Frequency of Communication with Friends and Family: More than three times a week  . Frequency of Social Gatherings with Friends and Family: More than three times a week  . Attends Religious Services: Never  . Active Member of Clubs or Organizations: No  . Attends Archivist Meetings: Never  . Marital Status: Married   Tobacco Counseling Counseling given: Not Answered   Clinical Intake:  Pre-visit preparation completed: Yes  Pain : No/denies pain     Nutritional Risks: None Diabetes: No  How often do you need to have someone help you when you read instructions, pamphlets, or other written materials from your doctor or pharmacy?: 1 - Never  Interpreter Needed?: No  Information entered by :: Oscar Forman,LPN  Activities of Daily Living In your present state of health, do you have any difficulty performing the following activities: 12/17/2019 04/29/2019  Hearing? Y N  Comment no hearing aids -  Vision? N N  Comment eyeglasses, my eye dr. -  Difficulty concentrating or making decisions? Y N  Comment usually comes back within seconds -  Walking or climbing stairs? N N  Dressing or bathing? N N  Doing errands, shopping? N N  Preparing Food and eating ? N -  Using the Toilet? N -  In the past six months, have you accidently  leaked urine? N -  Do you have problems with loss of bowel control? N -  Managing your Medications? N -  Managing your Finances? N -  Housekeeping or managing your Housekeeping? N -  Some recent data might be hidden     Immunizations and Health Maintenance Immunization History  Administered Date(s) Administered  . Influenza, High Dose Seasonal PF 10/07/2018, 07/24/2019  . Influenza, Quadrivalent, Recombinant, Inj, Pf 05/20/2019  . Influenza-Unspecified 05/20/2019  . PFIZER SARS-COV-2 Vaccination 11/26/2019, 12/17/2019  . Pneumococcal Conjugate-13 05/20/2019  . Zoster Recombinat (Shingrix) 05/20/2019   Health Maintenance Due  Topic Date Due  . Hepatitis C Screening  Never done    Patient Care Team: Nobie Putnam  J, DO as PCP - General (Family Medicine)  Indicate any recent Medical Services you may have received from other than [facility] providers in the past year (date may be approximate).    Assessment:   This is a routine wellness examination for [facility].  Hearing/Vision screen No exam data present  Dietary issues and exercise activities discussed: Current Exercise Habits: Home exercise routine, Type of exercise: Other - see comments(stationary bike), Time (Minutes): 30, Frequency (Times/Week): 3, Weekly Exercise (Minutes/Week): 90, Intensity: Mild, Exercise limited by: None identified  Goals Addressed   None    Depression Screen PHQ 2/9 Scores 12/17/2019 04/29/2019 03/11/2019  PHQ - 2 Score 0 0 0    Fall Risk Fall Risk  12/17/2019 12/02/2019 11/25/2019 04/29/2019 03/11/2019  Falls in the past year? 0 0 0 0 0  Number falls in past yr: 0 - - - -  Injury with Fall? 0 - - - -  Follow up - - - Falls evaluation completed Falls evaluation completed    [place]:  Any stairs in or around the home? Yes  If so, are there any without handrails? No   Home free of loose throw rugs in walkways, pet beds, electrical cords, etc? Yes  Adequate  lighting in your home to reduce risk of falls? Yes   ASSISTIVE DEVICES UTILIZED TO PREVENT FALLS:  Life alert? No  Use of a cane, walker or w/c? No  Grab bars in the bathroom? No  Shower chair or bench in shower? No  Elevated toilet seat or a handicapped toilet? No    TIMED UP AND GO:  Unable to perform   Cognitive Function:        Screening Tests Health Maintenance  Topic Date Due  . Hepatitis C Screening  Never done  . TETANUS/TDAP  04/29/2024 (Originally 09/20/1974)  . INFLUENZA VACCINE  03/15/2020  . FOOT EXAM  04/28/2020  . HEMOGLOBIN A1C  05/13/2020  . PNA vac Low Risk Adult (2 of 2 - PPSV23) 05/19/2020  . OPHTHALMOLOGY EXAM  06/23/2020  . Fecal DNA (Cologuard)  11/19/2022  . COVID-19 Vaccine  Completed    Qualifies for Shingles Vaccine? Yes  shingrix completed   Tdap: Discussed need for TD/TDAP vaccine, patient verbalized understanding that this is not covered as a preventative with there insurance and to call the office if he develops any new skin injuries, ie: cuts, scrapes, bug bites, or open wounds.  Flu Vaccine: up to date   Pneumococcal Vaccine: due 05/2020  Covid-19 Vaccine: first dose completed second dose scheduled 4/13 pzizer   Cancer Screenings:  Colorectal Screening: Completed cologuard 11/19/2019. Repeat every 3 years  Lung Cancer Screening: (Low Dose CT Chest recommended if Age 48-80 years, 30 pack-year currently smoking OR have quit w/in 15years.) does not qualify.    Additional Screening:  Hepatitis C Screening: does qualify  Vision Screening: Recommended annual ophthalmology exams for early detection of glaucoma and other disorders of the eye. Is the patient up to date with their annual eye exam?  Yes  Who is the provider or what is the name of the office in which the pt attends annual eye exams? My eye dr    Dental Screening: Recommended annual dental exams for proper oral hygiene  Community Resource Referral:  CRR required this  visit?  No        Plan:  I have personally reviewed and addressed the Medicare Annual Wellness questionnaire and have noted the following in  the patient's chart:  A. Medical and social history B. Use of alcohol, tobacco or illicit drugs  C. Current medications and supplements D. Functional ability and status E.  Nutritional status F.  Physical activity G. Advance directives H. List of other physicians I.  Hospitalizations, surgeries, and ER visits in previous 12 months J.  [place] such as hearing and vision if needed, cognitive and depression L. Referrals and appointments   In addition, I have reviewed and discussed with patient certain preventive protocols, quality metrics, and best practice recommendations. A written personalized care plan for preventive services as well as general preventive health recommendations were provided to patient.   Signed,    Bevelyn Ngo, LPN   [id number]  Nurse Health Advisor   Nurse Notes: none

## 2019-12-17 NOTE — Patient Instructions (Addendum)
Duane Grant , Thank you for taking time to come for your Medicare Wellness Visit. I appreciate your ongoing commitment to your health goals. Please review the following plan we discussed and let me know if I can assist you in the future.   Screening recommendations/referrals: Colonoscopy: cologuard completed 11/2019 Recommended yearly ophthalmology/optometry visit for glaucoma screening and checkup Recommended yearly dental visit for hygiene and checkup  Vaccinations: Influenza vaccine: up to date  Pneumococcal vaccine: up to date, due 05/2020 Tdap vaccine: up to date  Shingles vaccine: second dose due, please call and schedule    Covid-19: completed   Advanced directives: Advance directive discussed with you today.Once this is complete please bring a copy in to our office so we can scan it into your chart.  Conditions/risks identified: none   Next appointment: Follow up in one year for your annual wellness visit.   Preventive Care 66 Years and Older, Male Preventive care refers to lifestyle choices and visits with your health care provider that can promote health and wellness. What does preventive care include?  A yearly physical exam. This is also called an annual well check.  Dental exams once or twice a year.  Routine eye exams. Ask your health care provider how often you should have your eyes checked.  Personal lifestyle choices, including:  Daily care of your teeth and gums.  Regular physical activity.  Eating a healthy diet.  Avoiding tobacco and drug use.  Limiting alcohol use.  Practicing safe sex.  Taking low doses of aspirin every day.  Taking vitamin and mineral supplements as recommended by your health care provider. What happens during an annual well check? The services and screenings done by your health care provider during your annual well check will depend on your age, overall health, lifestyle risk factors, and family history of disease. Counseling    Your health care provider may ask you questions about your:  Alcohol use.  Tobacco use.  Drug use.  Emotional well-being.  Home and relationship well-being.  Sexual activity.  Eating habits.  History of falls.  Memory and ability to understand (cognition).  Work and work Statistician. Screening  You may have the following tests or measurements:  Height, weight, and BMI.  Blood pressure.  Lipid and cholesterol levels. These may be checked every 5 years, or more frequently if you are over 48 years old.  Skin check.  Lung cancer screening. You may have this screening every year starting at age 66 if you have a 30-pack-year history of smoking and currently smoke or have quit within the past 15 years.  Fecal occult blood test (FOBT) of the stool. You may have this test every year starting at age 38.  Flexible sigmoidoscopy or colonoscopy. You may have a sigmoidoscopy every 5 years or a colonoscopy every 10 years starting at age 41.  Prostate cancer screening. Recommendations will vary depending on your family history and other risks.  Hepatitis C blood test.  Hepatitis B blood test.  Sexually transmitted disease (STD) testing.  Diabetes screening. This is done by checking your blood sugar (glucose) after you have not eaten for a while (fasting). You may have this done every 1-3 years.  Abdominal aortic aneurysm (AAA) screening. You may need this if you are a current or former smoker.  Osteoporosis. You may be screened starting at age 22 if you are at high risk. Talk with your health care provider about your test results, treatment options, and if necessary, the need for  more tests. Vaccines  Your health care provider may recommend certain vaccines, such as:  Influenza vaccine. This is recommended every year.  Tetanus, diphtheria, and acellular pertussis (Tdap, Td) vaccine. You may need a Td booster every 10 years.  Zoster vaccine. You may need this after age  77.  Pneumococcal 13-valent conjugate (PCV13) vaccine. One dose is recommended after age 60.  Pneumococcal polysaccharide (PPSV23) vaccine. One dose is recommended after age 45. Talk to your health care provider about which screenings and vaccines you need and how often you need them. This information is not intended to replace advice given to you by your health care provider. Make sure you discuss any questions you have with your health care provider. Document Released: 08/28/2015 Document Revised: 04/20/2016 Document Reviewed: 06/02/2015 Elsevier Interactive Patient Education  2017 [employer] Prevention in the Home Falls can cause injuries. They can happen to people of all ages. There are many things you can do to make your home safe and to help prevent falls. What can I do on the outside of my home?  Regularly fix the edges of walkways and driveways and fix any cracks.  Remove anything that might make you trip as you walk through a door, such as a raised step or threshold.  Trim any bushes or trees on the path to your home.  Use bright outdoor lighting.  Clear any walking paths of anything that might make someone trip, such as rocks or tools.  Regularly check to see if handrails are loose or broken. Make sure that both sides of any steps have handrails.  Any raised decks and porches should have guardrails on the edges.  Have any leaves, snow, or ice cleared regularly.  Use sand or salt on walking paths during winter.  Clean up any spills in your garage right away. This includes oil or grease spills. What can I do in the bathroom?  Use night lights.  Install grab bars by the toilet and in the tub and shower. Do not use towel bars as grab bars.  Use non-skid mats or decals in the tub or shower.  If you need to sit down in the shower, use a plastic, non-slip stool.  Keep the floor dry. Clean up any water that spills on the floor as soon as it happens.  Remove  soap buildup in the tub or shower regularly.  Attach bath mats securely with double-sided non-slip rug tape.  Do not have throw rugs and other things on the floor that can make you trip. What can I do in the bedroom?  Use night lights.  Make sure that you have a light by your bed that is easy to reach.  Do not use any sheets or blankets that are too big for your bed. They should not hang down onto the floor.  Have a firm chair that has side arms. You can use this for support while you get dressed.  Do not have throw rugs and other things on the floor that can make you trip. What can I do in the kitchen?  Clean up any spills right away.  Avoid walking on wet floors.  Keep items that you use a lot in easy-to-reach places.  If you need to reach something above you, use a strong step stool that has a grab bar.  Keep electrical cords out of the way.  Do not use floor polish or wax that makes floors slippery. If you must use wax, use non-skid  floor wax.  Do not have throw rugs and other things on the floor that can make you trip. What can I do with my stairs?  Do not leave any items on the stairs.  Make sure that there are handrails on both sides of the stairs and use them. Fix handrails that are broken or loose. Make sure that handrails are as long as the stairways.  Check any carpeting to make sure that it is firmly attached to the stairs. Fix any carpet that is loose or worn.  Avoid having throw rugs at the top or bottom of the stairs. If you do have throw rugs, attach them to the floor with carpet tape.  Make sure that you have a light switch at the top of the stairs and the bottom of the stairs. If you do not have them, ask someone to add them for you. What else can I do to help prevent falls?  Wear shoes that:  Do not have high heels.  Have rubber bottoms.  Are comfortable and fit you well.  Are closed at the toe. Do not wear sandals.  If you use a  stepladder:  Make sure that it is fully opened. Do not climb a closed stepladder.  Make sure that both sides of the stepladder are locked into place.  Ask someone to hold it for you, if possible.  Clearly mark and make sure that you can see:  Any grab bars or handrails.  First and last steps.  Where the edge of each step is.  Use tools that help you move around (mobility aids) if they are needed. These include:  Canes.  Walkers.  Scooters.  Crutches.  Turn on the lights when you go into a dark area. Replace any light bulbs as soon as they burn out.  Set up your furniture so you have a clear path. Avoid moving your furniture around.  If any of your floors are uneven, fix them.  If there are any pets around you, be aware of where they are.  Review your medicines with your doctor. Some medicines can make you feel dizzy. This can increase your chance of falling. Ask your doctor what other things that you can do to help prevent falls. This information is not intended to replace advice given to you by your health care provider. Make sure you discuss any questions you have with your health care provider. Document Released: 05/28/2009 Document Revised: 01/07/2016 Document Reviewed: 09/05/2014 Elsevier Interactive Patient Education  2017 [employer].

## 2019-12-18 ENCOUNTER — Telehealth: Payer: Self-pay

## 2019-12-18 NOTE — Telephone Encounter (Signed)
Patient called back, states she forgot to mention in wellness visit he did have a fall in Jan of this year. He slipped on ice and hurt his back. 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 States his back pain is worsening when he is driving. Yesterdays pain was 10/10 pain. Advised to scheduled a follow up with Dr.Karamelgos for further evaluation. Scheduled follow up for 12/23/2019 in office. Patient confirmed.

## 2019-12-18 NOTE — Telephone Encounter (Signed)
Called and left message for patient with direct number- [phone].   Copied from [place] [phone]. Topic: General - Other >> Dec 17, 2019  3:39 PM Alanda Slim E wrote: Reason for CRM: Pt called to speak with [facility] and elaborate a little bit more about their appt today / Pt also asked if she submitted claim to medicare yet or is it too late to add an issue he is having with his back/ please have Lenon Kuennen give Pt a call asap/ please leave a message if calling from an unknown number.

## 2019-12-23 ENCOUNTER — Ambulatory Visit: Payer: Medicare Other | Admitting: Family Medicine

## 2019-12-23 ENCOUNTER — Encounter: Payer: Self-pay | Admitting: Family Medicine

## 2019-12-23 ENCOUNTER — Ambulatory Visit (INDEPENDENT_AMBULATORY_CARE_PROVIDER_SITE_OTHER): Payer: BC Managed Care – PPO | Admitting: Family Medicine

## 2019-12-23 ENCOUNTER — Other Ambulatory Visit: Payer: Self-pay

## 2019-12-23 VITALS — BP 136/82 | HR 69 | Temp 97.8°F | Resp 16 | Ht 68.0 in | Wt 219.6 lb

## 2019-12-23 DIAGNOSIS — M545 Low back pain, unspecified: Secondary | ICD-10-CM | POA: Insufficient documentation

## 2019-12-23 DIAGNOSIS — G8929 Other chronic pain: Secondary | ICD-10-CM | POA: Diagnosis not present

## 2019-12-23 MED ORDER — BACLOFEN 10 MG PO TABS: 5.0000 mg | ORAL_TABLET | Freq: Three times a day (TID) | ORAL | 2 refills | Status: DC | PRN

## 2019-12-23 NOTE — Patient Instructions (Addendum)
Thank you for coming to the office today.  1. For your Back Pain - I think that this is due to Muscle Spasms or strain.  2. If you want you MAY take - with anti-inflammatory Ibuprofen 200mg  take 2-4 pills per dose as needed with meal up to 2-3 times a day ONLY IF NEEDED 3. Start Baclofen (Lioresal) 10mg  tablets - cut in half for 5mg  at night for muscle relaxant - may make you sedated or sleepy (be careful driving or working on this) if tolerated you can take every 8 hours, half or whole tab 4. May use Tylenol Extra Str 500mg  tabs - may take 1-2 tablets every 6 hours as needed 5. Recommend to start using heating pad on your lower back 1-2x daily for few weeks  Also try a Wedge Seat Cushion to avoid nerve pinching when sitting prolonged period of time.  This pain may take weeks to months to fully resolve, but hopefully it will respond to the medicine initially. All back injuries (small or serious) are slow to heal since we use our back muscles every day. Be careful with turning, twisting, lifting, sitting / standing for prolonged periods, and avoid re-injury.  If your symptoms significantly worsen with more pain, or new symptoms with weakness in one or both legs, new or different shooting leg pains, numbness in legs or groin, loss of control or retention of urine or bowel movements, please call back for advice and you may need to go directly to the Emergency Department.  Stay tuned for referral  [facility] [place], [place]  [address] Phone: [phone]   Please schedule a Follow-up Appointment to: Return in about 4 weeks (around 01/20/2020), or if symptoms worsen or fail to improve, for back pain.  If you have any other questions or concerns, please feel free to call the office or send a message through [place]. You may also schedule an earlier appointment if necessary.  Additionally, you may be receiving a survey about your  experience at our office within a few days to 1 week by e-mail or mail. We value your feedback.  Nobie Putnam, DO [place]

## 2019-12-23 NOTE — Progress Notes (Signed)
Subjective:    Patient ID: Duane Grant, male    DOB: 1956-05-11, 64 y.o.   MRN: [id number]  OHM STARLIPER is a 64 y.o. 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 However he did have one slip and fall on ice back in 08/2019 pain initially but then it resolved. - Today seems to be gradual worsening or persistent. Describes pain as mild to moderate, occasionally can increase in severity. No pain radiating to legs. Admits some pain over R lateral flank or hip. - Taking Tylenol PRN. Was on Naproxen 500 BID PRN but has stopped this due to irritating stomach - Tried heating pad at night, ice, muscle Worse over long period of driving in seated position Improved if he has a lumbar back support - No known history of lumbar OA/DJD, no prior back surgery. Not seen ortho. No imaging. - Prior similar back pain flares improved with rest - Denies any fevers/chills, numbness, tingling, weakness, loss of control bladder/bowel incontinence or retention, unintentional wt loss, night sweats   Health Maintenance:  UTD [place] 11/26/19, and 12/17/19  Depression screen [facility] 2/9 12/23/2019 12/17/2019 04/29/2019  Decreased Interest 0 0 0  Down, Depressed, Hopeless 0 0 0  PHQ - 2 Score 0 0 0    Social History   Tobacco Use  . Smoking status: Former Smoker    Packs/day: 0.50    Years: 35.00    Pack years: 17.50    Types: Cigarettes    Quit date: 11/14/1994    Years since quitting: 25.1  . Smokeless tobacco: Former Network engineer Use Topics  . Alcohol use: Yes    Alcohol/week: 6.0 standard drinks    Types: 3 Cans of beer, 3 Standard drinks or equivalent per week    Comment: occassional  . Drug use: No    Review of  Systems Per HPI unless specifically indicated above     Objective:    BP 136/82   Pulse 69   Temp 97.8 F (36.6 C) (Temporal)   Resp 16   Ht 5\' 8"  (1.727 m)   Wt 219 lb 9.6 oz (99.6 kg)   BMI 33.39 kg/m   Wt Readings from Last 3 Encounters:  12/23/19 219 lb 9.6 oz (99.6 kg)  12/17/19 215 lb (97.5 kg)  12/02/19 217 lb (98.4 kg)    Physical Exam Vitals and nursing note reviewed.  Constitutional:      General: He is not in acute distress.    Appearance: He is well-developed. He is not diaphoretic.     Comments: Well-appearing, comfortable, cooperative  HENT:     Head: Normocephalic and atraumatic.  Eyes:     General:        Right eye: No discharge.        Left eye: No discharge.     Conjunctiva/sclera: Conjunctivae normal.  Cardiovascular:     Rate and Rhythm: Normal rate.  Pulmonary:     Effort: Pulmonary effort is normal.  Musculoskeletal:     Comments: Low Back Inspection: Normal appearance, no spinal deformity, symmetrical. Palpation: No tenderness over spinous processes. Bilateral lumbar paraspinal muscles non-tender but with some hypertonicity/spasm bilateral low to mid back.  ROM: Mild reduced active ROM forward flex / back extension, rotation L/R without discomfort Special Testing: Seated SLR negative for radicular pain bilaterally  Strength: Bilateral hip flex/ext 5/5, knee flex/ext 5/5, ankle dorsiflex/plantarflex 5/5 Neurovascular: intact distal sensation to light touch   Skin:    General: Skin is warm and dry.     Findings: No erythema or rash.  Neurological:     Mental Status: He is alert and oriented to person, place, and time.  Psychiatric:        Behavior: Behavior normal.     Comments: Well groomed, good eye contact, normal speech and thoughts       Results for orders placed or performed in visit on 11/21/19  Cologuard  Result Value Ref Range   Cologuard Negative Negative      Assessment & Plan:   Problem List Items Addressed This Visit      Chronic bilateral low back pain without sciatica - Primary   Relevant Medications   baclofen (LIORESAL) 10 MG tablet   Other Relevant Orders   Ambulatory referral to Orthopedic Surgery      Subacute on chronic bilateral LBP without associated sciatica. 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 No prior known OA/DJD - No red flag symptoms. Negative SLR for radiculopathy Not responding to conservative therapy NSAIDs Tylenol  Plan: 1. Start muscle relaxant with Baclofen 10mg  tabs - take 5-10mg  up to TID PRN, titrate up as tolerated - caution sedation use only during during driving breaks [id number] hours at a time. 2- May switch Naproxen to Ibuprofen lower dose PRN 3. May use Tylenol PRN for breakthrough 4. Encouraged use of heating pad 1-2x daily for now then PRN 5. REFERRAL TO [place] for further management X-ray and PT among other therapy options at this time.    Orders Placed This Encounter  Procedures  . Ambulatory referral to Orthopedic Surgery    Referral Priority:   Routine    Referral Type:   Surgical    Referral Reason:   Specialty Services Required    Requested Specialty:   Orthopedic Surgery    Number of Visits Requested:   1     Meds ordered this encounter  Medications  . baclofen (LIORESAL) 10 MG tablet    Sig: Take 0.5-1 tablets (5-10 mg total) by mouth 3 (three) times daily as needed for muscle spasms.    Dispense:  60 each    Refill:  2      Follow up plan: Return in about 4 weeks (around 01/20/2020), or if symptoms worsen or fail to improve, for back pain.   Nobie Putnam, [facility] 12/23/2019, 9:36 AM

## 2019-12-24 ENCOUNTER — Ambulatory Visit: Payer: Medicare Other

## 2020-01-06 DIAGNOSIS — M545 Low back pain: Secondary | ICD-10-CM | POA: Diagnosis not present

## 2020-03-02 DIAGNOSIS — Z87891 Personal history of nicotine dependence: Secondary | ICD-10-CM | POA: Diagnosis not present

## 2020-03-02 DIAGNOSIS — Z8546 Personal history of malignant neoplasm of prostate: Secondary | ICD-10-CM | POA: Diagnosis not present

## 2020-03-02 DIAGNOSIS — M549 Dorsalgia, unspecified: Secondary | ICD-10-CM | POA: Diagnosis not present

## 2020-03-02 DIAGNOSIS — Z923 Personal history of irradiation: Secondary | ICD-10-CM | POA: Diagnosis not present

## 2020-03-02 DIAGNOSIS — C61 Malignant neoplasm of prostate: Secondary | ICD-10-CM | POA: Diagnosis not present

## 2020-05-04 DIAGNOSIS — M5416 Radiculopathy, lumbar region: Secondary | ICD-10-CM | POA: Diagnosis not present

## 2020-05-11 ENCOUNTER — Other Ambulatory Visit: Payer: Self-pay | Admitting: Family Medicine

## 2020-05-11 ENCOUNTER — Ambulatory Visit (INDEPENDENT_AMBULATORY_CARE_PROVIDER_SITE_OTHER): Payer: BC Managed Care – PPO | Admitting: Family Medicine

## 2020-05-11 ENCOUNTER — Other Ambulatory Visit: Payer: Self-pay

## 2020-05-11 ENCOUNTER — Encounter: Payer: Self-pay | Admitting: Family Medicine

## 2020-05-11 VITALS — BP 118/55 | HR 80 | Temp 97.5°F | Resp 16 | Ht 68.0 in | Wt 218.0 lb

## 2020-05-11 DIAGNOSIS — E669 Obesity, unspecified: Secondary | ICD-10-CM

## 2020-05-11 DIAGNOSIS — G720 Drug-induced myopathy: Secondary | ICD-10-CM

## 2020-05-11 DIAGNOSIS — N401 Enlarged prostate with lower urinary tract symptoms: Secondary | ICD-10-CM | POA: Diagnosis not present

## 2020-05-11 DIAGNOSIS — H9192 Unspecified hearing loss, left ear: Secondary | ICD-10-CM

## 2020-05-11 DIAGNOSIS — I10 Essential (primary) hypertension: Secondary | ICD-10-CM | POA: Diagnosis not present

## 2020-05-11 DIAGNOSIS — E1169 Type 2 diabetes mellitus with other specified complication: Secondary | ICD-10-CM

## 2020-05-11 DIAGNOSIS — E785 Hyperlipidemia, unspecified: Secondary | ICD-10-CM

## 2020-05-11 DIAGNOSIS — B009 Herpesviral infection, unspecified: Secondary | ICD-10-CM | POA: Diagnosis not present

## 2020-05-11 DIAGNOSIS — Z1159 Encounter for screening for other viral diseases: Secondary | ICD-10-CM

## 2020-05-11 LAB — POCT GLYCOSYLATED HEMOGLOBIN (HGB A1C): Hemoglobin A1C: 7.3 % — AB (ref 4.0–5.6)

## 2020-05-11 MED ORDER — OZEMPIC (1 MG/DOSE) 4 MG/3ML ~~LOC~~ SOPN: 1.0000 mg | PEN_INJECTOR | SUBCUTANEOUS | 3 refills | Status: DC

## 2020-05-11 MED ORDER — LISINOPRIL-HYDROCHLOROTHIAZIDE 10-12.5 MG PO TABS: 0.5000 | ORAL_TABLET | Freq: Every day | ORAL | 3 refills | Status: DC

## 2020-05-11 MED ORDER — METFORMIN HCL 500 MG PO TABS: 500.0000 mg | ORAL_TABLET | Freq: Two times a day (BID) | ORAL | 3 refills | Status: DC

## 2020-05-11 MED ORDER — TAMSULOSIN HCL 0.4 MG PO CAPS: 0.4000 mg | ORAL_CAPSULE | Freq: Every day | ORAL | 3 refills | Status: DC

## 2020-05-11 MED ORDER — VALACYCLOVIR HCL 1 G PO TABS: 1000.0000 mg | ORAL_TABLET | Freq: Every day | ORAL | 1 refills | Status: DC | PRN

## 2020-05-11 NOTE — Progress Notes (Signed)
Subjective:    Patient ID: Duane Grant, male    DOB: [birth year], 64 y.o.   MRN: [id number]  Duane Grant is a 64 y.o. male presenting on 05/11/2020 for Diabetes  Wife, Kelli Churn here today.  HPI   CHRONIC DM, Type 2: Hyperlipidemia Previous history A1c, trend from 9 > 8, most recent results had been A1c 6.9 Today due for A1c Meds:Metformin 1000mg  AM ONLY w breakfast, Ozempic 1mg  weekly inj Reports good compliance. 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  Prostate Cancer Followed by by [facility] - Dr Lawanna Kobus - S/p Radiation 2010, PSA was up to 19 in 08/2019, then treated with Trelstar (ADT) On Flomax, need refill  CHRONIC HTN: Reports home BP readings 130 avg, had DOT physical already in 10/2019 Current Meds - Lisinopril-HCTZ 10-12.5mg  (HALF pill) daily   needs refill Reports good compliance, took meds today. Tolerating well, w/o complaints. Denies CP, dyspnea, HA, edema, dizziness / lightheadedness  Chronic Low Back Pain Last visit 12/23/19 with me, he was referred back to [facility] he got 2nd opinion with [facility], saw Carlynn Spry PA, and he has upcoming Lumbar MRI - He has been treated with Prednisone burst which has now completed, and he is on   Statin Myalgia Drug induced myopathy Hyperlipidemia Previously on Rosuvastatin 20mg  - had stopped this due to myalgia.  History of Recurrent Genital HSV No flare up recently. On Valtrex PRN only. Needs refill  Reduced Hearing, Left ear Reports chronic gradual decline in hearing, Left ear is worse. He would like a hearing test.   Health Maintenance: UTD COVID19 vaccine 11/2019 and 12/2019 Pfizer completed  Shingrix vaccine  updated.  Colon CA Screening: Never had colonoscopy. Cologuard negative 11/19/19 - next due 11/2022 (3 yr)    Depression screen [facility] 2/9 05/11/2020 12/23/2019 12/17/2019  Decreased Interest 0 0 0  Down, Depressed, Hopeless 0 0 0  PHQ - 2 Score 0 0 0    Social History   Tobacco Use  . Smoking status: Former Smoker    Packs/day: 0.50    Years: 35.00    Pack years: 17.50    Types: Cigarettes    Quit date: 11/14/1994    Years since quitting: 25.5  . Smokeless tobacco: Former Network engineer  . Vaping Use: Never used  Substance Use Topics  . Alcohol use: Yes    Alcohol/week: 6.0 standard drinks    Types: 3 Cans of beer, 3 Standard drinks or equivalent per week    Comment: occassional  . Drug use: No    Review of Systems Per HPI unless specifically indicated above     Objective:    BP (!) 118/55 (BP Location: Left Arm, Patient Position: Sitting, Cuff Size: Normal)   Pulse 80   Temp (!) 97.5 F (36.4 C) (Temporal)   Resp 16   Ht 5\' 8"  (1.727 m)   Wt 218 lb (98.9 kg)   SpO2 98%   BMI 33.15 kg/m   Wt Readings from Last 3 Encounters:  05/11/20 218 lb (98.9 kg)  12/23/19 219 lb 9.6 oz (99.6 kg)  12/17/19 215 lb (97.5 kg)    Physical Exam Vitals and nursing note reviewed.  Constitutional:  General: He is not in acute distress.    Appearance: He is well-developed. He is not diaphoretic.     Comments: Well-appearing, comfortable, cooperative  HENT:     Head: Normocephalic and atraumatic.     Right Ear: Tympanic membrane, ear canal and external ear normal. There is no impacted cerumen.     Left Ear: Tympanic membrane, ear canal and external ear normal. There is no impacted cerumen.  Eyes:     General:        Right eye: No discharge.        Left eye: No discharge.     Conjunctiva/sclera: Conjunctivae normal.  Cardiovascular:     Rate and Rhythm: Normal rate.  Pulmonary:     Effort: Pulmonary effort is normal.  Skin:    General: Skin is warm and dry.      Findings: No erythema or rash.  Neurological:     Mental Status: He is alert and oriented to person, place, and time.  Psychiatric:        Behavior: Behavior normal.     Comments: Well groomed, good eye contact, normal speech and thoughts      Diabetic Foot Exam - Simple   Simple Foot Form Diabetic Foot exam was performed with the following findings: Yes 05/11/2020  8:30 AM  Visual Inspection No deformities, no ulcerations, no other skin breakdown bilaterally: Yes Sensation Testing Intact to touch and monofilament testing bilaterally: Yes Pulse Check Posterior Tibialis and Dorsalis pulse intact bilaterally: Yes Comments    Recent Labs    08/12/19 0920 11/11/19 0919 05/11/20 0820  HGBA1C 6.9* 6.9* 7.3*     Results for orders placed or performed in visit on 05/11/20  POCT HgB A1C  Result Value Ref Range   Hemoglobin A1C 7.3 (A) 4.0 - 5.6 %      Assessment & Plan:   Problem List Items Addressed This Visit    Type 2 diabetes mellitus with other specified complication ([place]) - Primary    Slightly elevated A1c up to 7.3, but overall stable 6.9 to 7.3 range Improved on GLP1 Complications - hyperglycemia, hyperlipidemia - increases risk of future cardiovascular complications  - Failed Jardiance d/t yeast infection, off in 2020  Plan:  1. Continue Ozempic 1mg  weekly - new order 2. Switch Metformin to 500mg  BID wc instead of 1000 daily - new tab sent for 500s 3. Encourage improved lifestyle - low carb, low sugar diet, reduce portion size, continue improving regular exercise 4. Check CBG , bring log to next visit for review 5. Continue ASA, ACEi - previously on Statin (updated) 6. DM Foot exam 7. Due DM Eye      Relevant Medications   lisinopril-hydrochlorothiazide (ZESTORETIC) 10-12.5 MG tablet   metFORMIN (GLUCOPHAGE) 500 MG tablet   OZEMPIC, 1 MG/DOSE, 4 MG/3ML SOPN   Other Relevant Orders   POCT HgB A1C (Completed)   Recurrent HSV (herpes simplex virus)    Stable  without flare Rare occurrence Rx Valtrex 1g daily PRN 5-7 days flare in future      Relevant Medications   valACYclovir (VALTREX) 1000 MG tablet   Obesity (BMI 30.0-34.9)    Encourage weight loss lifestyle strategy changes      Hyperlipidemia associated with type 2 diabetes mellitus ([place])    Off statin due to myopathy, see A&P Due for fasting lipids in 10/2020      Relevant Medications   lisinopril-hydrochlorothiazide (ZESTORETIC) 10-12.5 MG tablet   metFORMIN (GLUCOPHAGE) 500 MG tablet  OZEMPIC, 1 MG/DOSE, 4 MG/3ML SOPN   Essential hypertension    Controlled HTN - Home BP readings reviewed, normal  No known complications  Passed DOT physical    Plan:  1. Continue current BP regimen -  Lisinopril-HCTZ 10-12.5mg  (HALF pill) daily refill 2. Encourage improved lifestyle - low sodium diet, regular exercise 3. Continue monitor BP outside office, bring readings to next visit, if persistently >140/90 or new symptoms notify office sooner      Relevant Medications   lisinopril-hydrochlorothiazide (ZESTORETIC) 10-12.5 MG tablet   Drug-induced myopathy    Previously on Rosuvastatin 20mg , failed due to statin induced myopathy myalgia       Other Visit Diagnoses    Benign prostatic hyperplasia with lower urinary tract symptoms, symptom details unspecified       Relevant Medications   tamsulosin (FLOMAX) 0.4 MG CAPS capsule   Hearing loss of left ear, unspecified hearing loss type       Relevant Orders   Ambulatory referral to Audiology      Chronic gradual hearing loss, L ear Refer to [facility]  Orders Placed This Encounter  Procedures  . Ambulatory referral to Audiology    Referral Priority:   Routine    Referral Type:   Audiology Exam    Referral Reason:   Specialty Services Required    Number of Visits Requested:   1  . POCT HgB A1C     Orders Placed This Encounter  Procedures  . Ambulatory referral to Audiology    Referral Priority:   Routine     Referral Type:   Audiology Exam    Referral Reason:   Specialty Services Required    Number of Visits Requested:   1  . POCT HgB A1C     Meds ordered this encounter  Medications  . lisinopril-hydrochlorothiazide (ZESTORETIC) 10-12.5 MG tablet    Sig: Take 0.5 tablets by mouth daily.    Dispense:  45 tablet    Refill:  3  . metFORMIN (GLUCOPHAGE) 500 MG tablet    Sig: Take 1 tablet (500 mg total) by mouth 2 (two) times daily with a meal.    Dispense:  180 tablet    Refill:  3  . OZEMPIC, 1 MG/DOSE, 4 MG/3ML SOPN    Sig: Inject 1 mg into the skin once a week.    Dispense:  9 mL    Refill:  3  . tamsulosin (FLOMAX) 0.4 MG CAPS capsule    Sig: Take 1 capsule (0.4 mg total) by mouth daily after supper.    Dispense:  90 capsule    Refill:  3  . valACYclovir (VALTREX) 1000 MG tablet    Sig: Take 1 tablet (1,000 mg total) by mouth daily as needed (HSV flare). For up to 5-7 as needed for flare, can repeat.    Dispense:  21 tablet    Refill:  1     Follow up plan: Return in about 6 months (around 11/08/2020) for 6 month fasting lab only then 1 week later [employer].  Future labs ordered for 11/02/20. Note his urologist checks PSA.   Nobie Putnam, [facility] 05/11/2020, 8:17 AM

## 2020-05-11 NOTE — Assessment & Plan Note (Signed)
Encourage weight loss lifestyle strategy changes

## 2020-05-11 NOTE — Assessment & Plan Note (Signed)
Off statin due to myopathy, see A&P Due for fasting lipids in 10/2020

## 2020-05-11 NOTE — Assessment & Plan Note (Signed)
Controlled HTN - Home BP readings reviewed, normal  No known complications  Passed DOT physical    Plan:  1. Continue current BP regimen -  Lisinopril-HCTZ 10-12.'5mg'$  (HALF pill) daily refill 2. Encourage improved lifestyle - low sodium diet, regular exercise 3. Continue monitor BP outside office, bring readings to next visit, if persistently >140/90 or new symptoms notify office sooner

## 2020-05-11 NOTE — Assessment & Plan Note (Signed)
Slightly elevated A1c up to 7.3, but overall stable 6.9 to 7.3 range Improved on GLP1 Complications - hyperglycemia, hyperlipidemia - increases risk of future cardiovascular complications  - Failed Jardiance d/t yeast infection, off in 2020  Plan:  1. Continue Ozempic 1mg  weekly - new order 2. Switch Metformin to 500mg  BID wc instead of 1000 daily - new tab sent for 500s 3. Encourage improved lifestyle - low carb, low sugar diet, reduce portion size, continue improving regular exercise 4. Check CBG , bring log to next visit for review 5. Continue ASA, ACEi - previously on Statin (updated) 6. DM Foot exam 7. Due DM Eye

## 2020-05-11 NOTE — Assessment & Plan Note (Signed)
Stable without flare Rare occurrence Rx Valtrex 1g daily PRN 5-7 days flare in future

## 2020-05-11 NOTE — Assessment & Plan Note (Signed)
Previously on Rosuvastatin 20mg , failed due to statin induced myopathy myalgia

## 2020-05-11 NOTE — Patient Instructions (Addendum)
Thank you for coming to the office today.  Recent Labs    08/12/19 0920 11/11/19 0919 05/11/20 0820  HGBA1C 6.9* 6.9* 7.3*   Switch metformin to 500mg  twice a day.   DUE for FASTING BLOOD WORK (no food or drink after midnight before the lab appointment, only water or coffee without cream/sugar on the morning of)  SCHEDULE "Lab Only" visit in the morning at the clinic for lab draw in 6 MONTHS   - Make sure Lab Only appointment is at about 1 week before your next appointment, so that results will be available  For Lab Results, once available within 2-3 days of blood draw, you can can log in to MyChart online to view your results and a brief explanation. Also, we can discuss results at next follow-up visit.   Please schedule a Follow-up Appointment to: Return in about 6 months (around 11/08/2020) for 6 month fasting lab only then 1 week later [facility].  If you have any other questions or concerns, please feel free to call the office or send a message through [facility]. You may also schedule an earlier appointment if necessary.  Additionally, you may be receiving a survey about your experience at our office within a few days to 1 week by e-mail or mail. We value your feedback.  Nobie Putnam, DO [place]

## 2020-05-13 DIAGNOSIS — M5416 Radiculopathy, lumbar region: Secondary | ICD-10-CM | POA: Diagnosis not present

## 2020-05-16 ENCOUNTER — Ambulatory Visit
Admission: EM | Admit: 2020-05-16 | Discharge: 2020-05-16 | Disposition: A | Discharge status: Home / Self Care | Payer: Medicare Other | Attending: Internal Medicine | Admitting: Internal Medicine

## 2020-05-16 ENCOUNTER — Other Ambulatory Visit: Payer: Self-pay

## 2020-05-16 DIAGNOSIS — S61012A Laceration without foreign body of left thumb without damage to nail, initial encounter: Secondary | ICD-10-CM

## 2020-05-16 DIAGNOSIS — Z23 Encounter for immunization: Secondary | ICD-10-CM | POA: Diagnosis not present

## 2020-05-16 MED ORDER — TETANUS-DIPHTH-ACELL PERTUSSIS 5-2.5-18.5 LF-MCG/0.5 IM SUSP
0.5000 mL | Freq: Once | INTRAMUSCULAR | Status: AC
  Administered 2020-05-16: 0.5 mL via INTRAMUSCULAR

## 2020-05-16 NOTE — ED Provider Notes (Signed)
[facility]    CSN: [id number] Arrival date & time: 05/16/20  0932      History   Chief Complaint No chief complaint on file.   HPI Duane Grant is a 64 y.o. male who presents with laceration of his L thumb which he cut with a kitchen knife this am. He does not recall when his last TD was.     Past Medical History:  Diagnosis Date  . Arthritis   . Cancer [facility]) 2009   Prostate, radiation but no surgery  . Elevated PSA   . H/O removal of cyst 12/02/2019   back   . History of prostate cancer   . Hyperlipidemia   . Hypertension     Patient Active Problem List   Diagnosis Date Noted  . Drug-induced myopathy 05/11/2020  . Chronic bilateral low back pain without sciatica 12/23/2019  . Hyperlipidemia associated with type 2 diabetes mellitus ([place]) 11/11/2019  . Recurrent HSV (herpes simplex virus) 03/11/2019  . Arthritis of carpometacarpal [facility]) joint of left thumb 03/11/2019  . Obesity (BMI 30.0-34.9) 03/11/2019  . Status post total hip replacement, left 06/09/2016  . Hip pain 05/18/2016  . Type 2 diabetes mellitus with other specified complication ([place]) 01/75/1025  . Prostate cancer ([place]) 02/06/2014  . 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 Essential hypertension 12/06/2013  . Herpesviral infection of urogenital system 12/06/2013  . Male erectile dysfunction 12/06/2013  . Neck mass 12/06/2013    Past Surgical History:  Procedure Laterality Date  . HERNIA REPAIR  8527   Umbilical  . HERNIA REPAIR  2010  . Removal Fatty Tumor Right 2015   [facility], right side of chin/jaw  . TOTAL HIP ARTHROPLASTY Left 06/09/2016   Procedure: TOTAL HIP ARTHROPLASTY;  Surgeon: Thornton Park, MD;  Location: [facility] ORS;  Service: Orthopedics;  Laterality: Left;       Home Medications    Prior to Admission medications   Medication Sig Start Date End Date Taking? Authorizing Provider  lisinopril-hydrochlorothiazide (ZESTORETIC) 10-12.5 MG tablet Take 0.5 tablets by  mouth daily. 05/11/20  Yes Karamalegos, Devonne Doughty, DO  metFORMIN (GLUCOPHAGE) 500 MG tablet Take 1 tablet (500 mg total) by mouth 2 (two) times daily with a meal. 05/11/20  Yes Karamalegos, Devonne Doughty, DO  methocarbamol (ROBAXIN) 750 MG tablet Take 750 mg by mouth 3 (three) times daily. 05/04/20  Yes [provider]  Omega-3 Fatty Acids (FISH OIL) 1200 MG CAPS Take 1 capsule by mouth 2 (two) times daily.   Yes [provider]  OZEMPIC, 1 MG/DOSE, 4 MG/3ML SOPN Inject 1 mg into the skin once a week. 05/11/20  Yes Karamalegos, Devonne Doughty, DO  tamsulosin (FLOMAX) 0.4 MG CAPS capsule Take 1 capsule (0.4 mg total) by mouth daily after supper. 05/11/20  Yes Karamalegos, Devonne Doughty, DO  valACYclovir (VALTREX) 1000 MG tablet Take 1 tablet (1,000 mg total) by mouth daily as needed (HSV flare). For up to 5-7 as needed for flare, can repeat. 05/11/20  Yes Karamalegos, Devonne Doughty, DO  diclofenac sodium (VOLTAREN) 1 % GEL Apply 2 g topically 3 (three) times daily as needed. For thumb arthritis Patient not taking: Reported on 05/11/2020 03/11/19   Olin Hauser, DO    Family History Family History  Problem Relation Age of Onset  . Diabetes Mother   . Hypertension Mother     Social History Social History   Tobacco Use  . Smoking status: Former Smoker    Packs/day: 0.50  Years: 35.00    Pack years: 17.50    Types: Cigarettes    Quit date: 11/14/1994    Years since quitting: 25.5  . Smokeless tobacco: Former Network engineer  . Vaping Use: Never used  Substance Use Topics  . Alcohol use: Yes    Alcohol/week: 6.0 standard drinks    Types: 3 Cans of beer, 3 Standard drinks or equivalent per week    Comment: occassional  . Drug use: No     Allergies   Patient has no known allergies.   Review of Systems Review of Systems  Endocrine:       Is a diabetic and wounds heal slow  Skin: Positive for wound.  Neurological: Negative for numbness.     Physical  Exam Triage Vital Signs ED Triage Vitals  Enc Vitals Group     BP 05/16/20 1024 118/68     Pulse Rate 05/16/20 1024 79     Resp 05/16/20 1024 18     Temp 05/16/20 1024 98 F (36.7 C)     Temp Source 05/16/20 1024 Oral     SpO2 05/16/20 1024 99 %     Weight 05/16/20 1021 215 lb (97.5 kg)     Height 05/16/20 1021 5\' 8"  (1.727 m)     Head Circumference --      Peak Flow --      Pain Score 05/16/20 1021 3     Pain Loc --      Pain Edu? --      Excl. in [place]? --    No data found.  Updated Vital Signs BP 118/68 (BP Location: Left Arm)   Pulse 79   Temp 98 F (36.7 C) (Oral)   Resp 18   Ht 5\' 8"  (1.727 m)   Wt 215 lb (97.5 kg)   SpO2 99%   BMI 32.69 kg/m   Visual Acuity Right Eye Distance:   Left Eye Distance:   Bilateral Distance:    Right Eye Near:   Left Eye Near:    Bilateral Near:     Physical Exam Vitals and nursing note reviewed.  Constitutional:      General: He is not in acute distress.    Appearance: He is obese. He is not toxic-appearing.  HENT:     Right Ear: External ear normal.     Left Ear: External ear normal.  Eyes:     General: No scleral icterus.    Conjunctiva/sclera: Conjunctivae normal.  Pulmonary:     Effort: Pulmonary effort is normal.  Musculoskeletal:        General: Tenderness and signs of injury present. Normal range of motion.     Cervical back: Neck supple.  Skin:    General: Skin is warm and dry.     Comments: Has a 2 cm laceration on distal volar L thumb with moderate bleeding but is clean. Does not involve the nail.   Neurological:     Mental Status: He is alert and oriented to person, place, and time.     Sensory: No sensory deficit.     Gait: Gait normal.  Psychiatric:        Mood and Affect: Mood normal.        Behavior: Behavior normal.        Thought Content: Thought content normal.        Judgment: Judgment normal.      UC Treatments / Results  Labs (all labs ordered are listed,  but only abnormal results are  displayed) Labs Reviewed - No data to display  EKG   Radiology No results found.  Procedures Laceration Repair  Date/Time: 05/16/2020 11:24 AM Performed by: Shelby Mattocks, PA-C Authorized by: Shelby Mattocks, PA-C   Consent:    Consent obtained:  Verbal   Consent given by:  Patient   Risks discussed:  Pain Anesthesia (see MAR for exact dosages):    Anesthesia method:  Local infiltration   Local anesthetic:  Lidocaine 1% w/o epi Laceration details:    Location:  Finger   Finger location:  L thumb   Length (cm):  2   Depth (mm):  2 Repair type:    Repair type:  Simple Pre-procedure details:    Preparation:  Patient was prepped and draped in usual sterile fashion Exploration:    Hemostasis achieved with:  Direct pressure   Wound exploration: wound explored through full range of motion     Wound extent: no foreign bodies/material noted     Contaminated: no   Treatment:    Area cleansed with:  Betadine and saline   Amount of cleaning:  Standard   Irrigation solution:  Sterile saline   Irrigation method:  Syringe   Visualized foreign bodies/material removed: no   Skin repair:    Repair method:  Sutures   Suture size:  4-0   Suture material:  Nylon   Suture technique:  Simple interrupted   Number of sutures:  3 Approximation:    Approximation:  Close Post-procedure details:    Dressing:  Antibiotic ointment and sterile dressing   Patient tolerance of procedure:  Tolerated well, no immediate complications   (including critical care time)  Medications Ordered in UC Medications - No data to display  Initial Impression / Assessment and Plan / UC Course  I have reviewed the triage vital signs and the nursing notes. Needs to have sutures removed in 7-10 days. See instructions.  Final Clinical Impressions(s) / UC Diagnoses   Final diagnoses:  None   Discharge Instructions   None    ED Prescriptions    None     PDMP not reviewed  this encounter.   Shelby Mattocks, PA-C 05/16/20 1127

## 2020-05-16 NOTE — ED Triage Notes (Signed)
Pt presents with laceration to his left thumb with a kitchen knife. Pt states the area was bleeding. He did bandage the thumb. Pt did take some Tylenol for the pain. Pt does not recall his last tetanus shot.

## 2020-05-16 NOTE — Discharge Instructions (Addendum)
Keep your hand elevated for a few days Keep the wound clean and dry with dressing on for 48h. If the dressing gets soaked with blood, then you may change it before that. Otherwise, after 48h, change the dressing daily and wash wound with soap and water and not peroxide. Apply triple antibiotic ointment each time.  Watch out for signs of infection like redness, heat or increased pain. If so come back.  Stiches need to come out in 7-10 days

## 2020-05-18 ENCOUNTER — Telehealth: Payer: Self-pay | Admitting: Family Medicine

## 2020-05-18 NOTE — Telephone Encounter (Signed)
Kristie calling from [facility] to let Dr. Raliegh Ip know that sent a medical records release was received. The records have not been sent because the patient has not been seen yet. He will be seen on 05/25/20. Patient will be seen for Hearing aid evaluation.

## 2020-05-22 DIAGNOSIS — M47896 Other spondylosis, lumbar region: Secondary | ICD-10-CM | POA: Diagnosis not present

## 2020-05-23 ENCOUNTER — Other Ambulatory Visit: Payer: Self-pay

## 2020-05-23 ENCOUNTER — Ambulatory Visit
Admission: EM | Admit: 2020-05-23 | Discharge: 2020-05-23 | Disposition: A | Discharge status: Home / Self Care | Payer: Medicare Other | Attending: Family Medicine | Admitting: Family Medicine

## 2020-05-23 DIAGNOSIS — Z4802 Encounter for removal of sutures: Secondary | ICD-10-CM

## 2020-05-23 NOTE — ED Notes (Signed)
Removed 3 sutures from patients left thumb. Tolerated well.

## 2020-05-23 NOTE — ED Triage Notes (Signed)
Patient is here for suture removal from his left thumb. Well healing.

## 2020-05-25 DIAGNOSIS — H90A22 Sensorineural hearing loss, unilateral, left ear, with restricted hearing on the contralateral side: Secondary | ICD-10-CM | POA: Diagnosis not present

## 2020-05-26 ENCOUNTER — Ambulatory Visit: Payer: BC Managed Care – PPO | Attending: Orthopedic Surgery | Admitting: Physical Therapy

## 2020-05-26 ENCOUNTER — Encounter: Payer: Self-pay | Admitting: Physical Therapy

## 2020-05-26 ENCOUNTER — Other Ambulatory Visit: Payer: Self-pay

## 2020-05-26 DIAGNOSIS — M545 Low back pain, unspecified: Secondary | ICD-10-CM | POA: Diagnosis not present

## 2020-05-26 DIAGNOSIS — M5386 Other specified dorsopathies, lumbar region: Secondary | ICD-10-CM | POA: Insufficient documentation

## 2020-05-26 DIAGNOSIS — G8929 Other chronic pain: Secondary | ICD-10-CM | POA: Insufficient documentation

## 2020-05-26 DIAGNOSIS — R29898 Other symptoms and signs involving the musculoskeletal system: Secondary | ICD-10-CM | POA: Insufficient documentation

## 2020-05-26 DIAGNOSIS — M6281 Muscle weakness (generalized): Secondary | ICD-10-CM | POA: Insufficient documentation

## 2020-05-26 NOTE — Therapy (Signed)
[facility] [address] [place], [place], [address] Phone: [phone]   Fax:  [phone]  Physical Therapy Evaluation  Patient Details  Name: Duane Grant MRN: [id number] Date of Birth: 1956-02-24 Referring Provider (PT): Lamount Cranker MD   Encounter Date: 05/26/2020   PT End of Session - 05/26/20 0923    Visit Number 1    Number of Visits 9    Date for PT Re-Evaluation 06/23/20    Authorization - Visit Number 1    Authorization - Number of Visits 10    PT Start Time 0734    PT Stop Time 0826    PT Time Calculation (min) 52 min    Activity Tolerance Patient tolerated treatment well    Behavior During Therapy [facility] for tasks assessed/performed           Past Medical History:  Diagnosis Date  . Arthritis   . Cancer [facility]) 2009   Prostate, radiation but no surgery  . Elevated PSA   . H/O removal of cyst 12/02/2019   back   . History of prostate cancer   . Hyperlipidemia   . Hypertension     Past Surgical History:  Procedure Laterality Date  . HERNIA REPAIR  5638   Umbilical  . HERNIA REPAIR  2010  . Removal Fatty Tumor Right 2015   [facility], right side of chin/jaw  . TOTAL HIP ARTHROPLASTY Left 06/09/2016   Procedure: TOTAL HIP ARTHROPLASTY;  Surgeon: Thornton Park, MD;  Location: [facility] ORS;  Service: Orthopedics;  Laterality: Left;    There were no vitals filed for this visit.    Subjective Assessment - 05/26/20 0732    Subjective Pt is a 64 y.o. male referred to PT for LBP. 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 Pt believes his profession is part of his LBP. Currently driving from [place] when previously he was driving for weeks at a time. Pt does report using Low back cushion that relieves symptoms temporarily but his LBP waxes and wanes. Does report imaging of MRI of  lumbar spine. Current LBP radiating from the middles of the low back on the R side and also L shoulder blade. After standing for 1 hour pt reports N/T after 1 hour that can someitmes radiate to both legs. Pt denies weight loss/gain, bowel and bladder issues. Current pain levels: 5/10 NPS that is dul/achey, and sharp with some rotational tasks. Worst pain reported at 10/10 NPS but does have periods of no pain. Pt's goal with PT is to have less pain.    Patient Stated Goals Decrease low back pain    Currently in Pain? Yes    Pain Score 5     Pain Location Back    Pain Orientation Right;Left    Pain Descriptors / Indicators Aching;Discomfort;Dull;Sharp              OBJECTIVE  Mental Status Patient's fund of knowledge is within normal limits for educational level.  SENSATION: Grossly intact to light touch bilateral LEs as determined by testing dermatomes L2-S2 Proprioception and hot/cold testing deferred on this date   MUSCULOSKELETAL: Tremor: None Bulk: Normal Tone: Normal No visible step-off along spinal column  Posture Lumbar lordosis: Increased lordosis Iliac crest height: equal bilaterally Lumbar lateral shift: negative   Gait Antalgic gait on RLE.  Palpation  TTP along B lumbar paraspinals.   Strength (out of 5) R/L 4+/4+ Hip flexion 4/4 Hip abduction 5/5 Hip adduction (seated) 5/5 Hip extension 5/5 Knee extension 5/5 Ankle dorsiflexion 5/5 Ankle plantarflexion  *Indicates pain   AROM (degrees) R/L (all movements include overpressure unless otherwise stated) Lumbar forward flexion (65): 25 degrees. Required use of BUE's to walk up LE's in order to stand back up Lumbar extension (30): 15 degrees Lumbar lateral flexion (25): R: WFL* L: WFL  Thoracic and Lumbar rotation (30 degrees):  R: WFL L: WFL*  *Indicates pain   Repeated Movements No centralization or peripheralization of symptoms with repeated lumbar extension or flexion.    Passive Accessory  Intervertebral Motion (PAIVM) Pt denies reproduction of back pain with CPA L1-L5 and UPA bilaterally L1-L5. Generally hypomobile throughout.    SPECIAL TESTS  Prone Instability Test: NEGATIVE  Functional Tasks  5x Sit to stand: 48.6 sec   Outcome Measures FOTO: 33/52    Neurogenic vs Vascular Claudication (Rieman, 2016)  Description Neurogenic Vascular  Quality of pain cramping Burning, cramping  LBP Frequently present Absent   Sensory symptoms Frequently present Absent  Muscle weakness Frequently present Absent  Reflex changes Frequently present Absent  Bicycle test Symptoms only when sitting upright Symptoms not position dependent  Arterial pulses Normal  Decreased or absent  Skin/dystrophic changes Absent  Frequently present  Aggravating factors Upright posture, extension of trunk Any leg activity  Relieving factors Sitting, bending forward Rest, no leg activity  Walking uphill Symptoms produced later Not position dependent  Walking downhill Symptoms produced earlier Not position dependent    Lumbar Spinal Stenosis vs Radiculopathy due to Disc Herniation (Rieman, 2016)  Description Spinal Stenosis Disc Herniation  Age Usually > 2 y/o Usually < 5 y/o  Onset Usually more insidious Usually more sudden  Position change: flexion Better  Worse  Position change: extension Worse Better  Focal muscle weakness Less common Can be common  Dural tension Less common Common  UMN or LMN involvement  Central stenosis: UMN Lateral foraminal stenosis: LMN LMN    Objective measurements completed on examination: See above findings.     PT Education - 05/26/20 6144    Education Details Form/technique with HEP.    Person(s) Educated Patient    Methods Explanation;Demonstration    Comprehension Verbalized understanding;Returned demonstration               PT Long Term Goals - 05/26/20 0924      PT LONG TERM GOAL #1   Title Pt will improve FOTO score to 52 to demonstrate  clinically significant improvement in functional mobility.    Baseline 10/12: 33    Time 4    Period Weeks    Status New    Target Date 06/23/20      PT LONG TERM GOAL #2   Title Pt will improve 5xSTS time by 20% to demonstrate clinically significant improvement in LE strength for squatting ADL's.    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    Baseline 10/12: pain and stiffness with driving in lumbar region at 5/10 NPS.    Time  4    Period Weeks    Status New    Target Date 06/23/20      PT LONG TERM GOAL #4   Title Pt will demonstrate full lumbar flex/exten AROM to improve pain free mobility.    Baseline 10/12: lumbar flex: 25 deg, lumbar ext: 15 deg    Time 4    Period Weeks    Status New    Target Date 06/23/20                  Plan - 05/26/20 0933    Clinical Impression Statement Pt is a pleasant 64 y.o. male referred to PT for lumbar spondylosis. Pt has full sensation to light touch in BLE's with current LBP at 5/10 NPS in B low back that gets up to 10 with prolonged standing or with rotational tasks where he feels his low back "catches". Via MMT, full strength except for 4+/5 in B hip flexors and hip abduction. (-) slump and SLR bilaterally and AROM of hip and knee WFL's. Limited lumbar flexion/extension with flexion measured at 25 deg and extension 15 deg. Pain with R lat flexion and L rotation. (-) faber and fadir bilaterally. Pt is TTP along B lumbar paraspinals and is hypomobile throughout lumbar vertebrae L5-L3. Pt has increased mobility at L1-L2 where pt appears to be hinging with standing lumbar extension. These impairments are limiting the pt's ability to perform job related tasks and bending/squatting ADL's. With bending  forward, pt requires use of BUE's to walk up his legs to stand indicating stability deficits in lumbar region. Pt can continue to benefit from skilled PT services to improve pain, mobility, and strength for improved functional mobility.    Personal Factors and Comorbidities Past/Current Experience;Comorbidity 2;Time since onset of injury/illness/exacerbation;Age    Examination-Activity Limitations Locomotion Level;Carry;Sleep;Squat;Stairs;Stand;Lift    Examination-Participation Restrictions Cleaning;Community Activity;Driving    Stability/Clinical Decision Making Evolving/Moderate complexity    Clinical Decision Making Low    Rehab Potential Good    PT Frequency 2x / week    PT Duration 4 weeks    PT Treatment/Interventions ADLs/Self Care Home Management;Cryotherapy;Electrical Stimulation;Gait training;Moist Heat;Traction;Neuromuscular re-education;Patient/family education;Dry needling;Manual techniques;Spinal Manipulations;Joint Manipulations    PT Next Visit Plan Reassess HEP, core progression.    PT Home Exercise Plan Access Code: [email or web address]    Consulted and Agree with Plan of Care Patient           Patient will benefit from skilled therapeutic intervention in order to improve the following deficits and impairments:  Abnormal gait, Decreased range of motion, Difficulty walking, Increased muscle spasms, Decreased activity tolerance, Pain, Hypomobility, Impaired flexibility, Decreased mobility, Decreased strength  Visit Diagnosis: Chronic bilateral low back pain without sciatica  Muscle weakness (generalized)  Impaired flexibility of lower extremity  Decreased range of motion of intervertebral discs of lumbar spine     Problem List Patient Active Problem List   Diagnosis Date Noted  . Drug-induced myopathy 05/11/2020  . Chronic bilateral low back pain without sciatica 12/23/2019  . Hyperlipidemia associated with type 2 diabetes mellitus ([place]) 11/11/2019  . Recurrent HSV  (herpes simplex virus) 03/11/2019  . Arthritis of carpometacarpal [facility]) joint of left thumb 03/11/2019  . Obesity (BMI 30.0-34.9) 03/11/2019  . Status post total hip replacement, left 06/09/2016  . Hip pain 05/18/2016  . Type 2 diabetes mellitus with other specified complication ([place]) 45/80/9983  . Prostate cancer ([place]) 02/06/2014  . 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 Essential hypertension 12/06/2013  . Herpesviral infection of  urogenital system 12/06/2013  . Male erectile dysfunction 12/06/2013  . Neck mass 12/06/2013   Pura Spice, PT, DPT # [id number] SPT 05/27/2020, 8:11 AM  [facility] [address] [place], [place], [address] Phone: [phone]   Fax:  [phone]  Name: Duane Grant MRN: [id number] Date of Birth: 1956/01/31

## 2020-05-28 ENCOUNTER — Other Ambulatory Visit: Payer: Self-pay

## 2020-05-28 ENCOUNTER — Ambulatory Visit: Payer: BC Managed Care – PPO | Admitting: Physical Therapy

## 2020-05-28 ENCOUNTER — Encounter: Payer: Self-pay | Admitting: Physical Therapy

## 2020-05-28 DIAGNOSIS — R29898 Other symptoms and signs involving the musculoskeletal system: Secondary | ICD-10-CM | POA: Diagnosis not present

## 2020-05-28 DIAGNOSIS — G8929 Other chronic pain: Secondary | ICD-10-CM

## 2020-05-28 DIAGNOSIS — M5386 Other specified dorsopathies, lumbar region: Secondary | ICD-10-CM | POA: Diagnosis not present

## 2020-05-28 DIAGNOSIS — M6281 Muscle weakness (generalized): Secondary | ICD-10-CM | POA: Diagnosis not present

## 2020-05-28 DIAGNOSIS — M545 Low back pain, unspecified: Secondary | ICD-10-CM | POA: Diagnosis not present

## 2020-05-28 NOTE — Addendum Note (Signed)
Addended by: Pura Spice on: 05/28/2020 07:25 AM   Modules accepted: Orders

## 2020-05-28 NOTE — Therapy (Signed)
[facility] [address] [place], [place], [address] Phone: [phone]   Fax:  [phone]  Physical Therapy Treatment  Patient Details  Name: Duane Grant MRN: [id number] Date of Birth: [birth year] Referring Provider (PT): Lamount Cranker MD   Encounter Date: 05/28/2020   Treatment: 2 of 9.  Recert date: 09/19/4268 6237 to 0818   Past Medical History:  Diagnosis Date  . Arthritis   . Cancer [facility]) 2009   Prostate, radiation but no surgery  . Elevated PSA   . H/O removal of cyst 12/02/2019   back   . History of prostate cancer   . Hyperlipidemia   . Hypertension     Past Surgical History:  Procedure Laterality Date  . HERNIA REPAIR  6283   Umbilical  . HERNIA REPAIR  2010  . Removal Fatty Tumor Right 2015   [facility], right side of chin/jaw  . TOTAL HIP ARTHROPLASTY Left 06/09/2016   Procedure: TOTAL HIP ARTHROPLASTY;  Surgeon: Thornton Park, MD;  Location: [facility] ORS;  Service: Orthopedics;  Laterality: Left;    There were no vitals filed for this visit.  Pt reports no pain this morning. Has been compliant with HEP. Pt has been participating in stationary bike with no pain and has been enjoying it. Pt believes HEP has been helpful.       There.ex:   Seated exercise ball roll outs for lumbar mobility alternating R/L: 1x20/direction  B side-lying hip abduction: 1x12 reps. Mod tactile cues for form/technique    Progressed to resisted with 3 lbs AW's at distal femur: 2x8. Noted difficulty with performing 8 reps due to weakness.   TrA dead bugs: 1x12. Use of gait belt as tactile cue on lumbar spine for core activation.   Resisted paloff presses with R TB: 3 steps, 5 presses. X5R/L. Min verbal and tactile cues for form/technique.   Resisted wlaking at [employer] 4 ways: x5/direction for LE and core activation. CGA+1 due to intermittent LOB with ability to recover indep.  Educated on safe use of red TB for  paloff press walkouts.         PT Long Term Goals - 05/26/20 0924      PT LONG TERM GOAL #1   Title Pt will improve FOTO score to 52 to demonstrate clinically significant improvement in functional mobility.    Baseline 10/12: 33    Time 4    Period Weeks    Status New    Target Date 06/23/20      PT LONG TERM GOAL #2   Title Pt will improve 5xSTS time by 20% to demonstrate clinically significant improvement in LE strength for squatting ADL's.    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    Baseline 10/12: pain and stiffness with driving in lumbar region at 5/10 NPS.    Time 4    Period Weeks    Status New    Target Date 06/23/20      PT LONG TERM GOAL #4   Title Pt will demonstrate full lumbar flex/exten AROM to improve pain free mobility.    Baseline  10/12: lumbar flex: 25 deg, lumbar ext: 15 deg    Time 4    Period Weeks    Status New    Target Date 06/23/20                 Plan - 05/28/20 0934    Clinical Impression Statement Pt progressed to added resistance with side-lying hip abduction with 3 lbs AW's and red TB at home. Pt educated on benefits of beginning core progression to HEP to improve lumbar stability and strength. Pt demonstrated indep with dead bugs and red TB resisted paloff steps and presses bilaterally with noted difficulty stepping out to the left due to RLE and core weakness using RLE as the anchor leg. Pt can continue to benefit from skilled PT treatment to improve strength and functional mobility.    Personal Factors and Comorbidities Past/Current Experience;Comorbidity 2;Time since onset of injury/illness/exacerbation;Age    Examination-Activity Limitations Locomotion Level;Carry;Sleep;Squat;Stairs;Stand;Lift     Examination-Participation Restrictions Cleaning;Community Activity;Driving    Stability/Clinical Decision Making Evolving/Moderate complexity    Clinical Decision Making Low    Rehab Potential Good    PT Frequency 2x / week    PT Duration 4 weeks    PT Treatment/Interventions ADLs/Self Care Home Management;Cryotherapy;Electrical Stimulation;Gait training;Moist Heat;Traction;Neuromuscular re-education;Patient/family education;Dry needling;Manual techniques;Spinal Manipulations;Joint Manipulations    PT Next Visit Plan Core and LE exercises    PT Home Exercise Plan Access Code: [email or web address]    Consulted and Agree with Plan of Care Patient                   Patient will benefit from skilled therapeutic intervention in order to improve the following deficits and impairments:  Abnormal gait, Decreased range of motion, Difficulty walking, Increased muscle spasms, Decreased activity tolerance, Pain, Hypomobility, Impaired flexibility, Decreased mobility, Decreased strength  Visit Diagnosis: Chronic bilateral low back pain without sciatica  Muscle weakness (generalized)  Impaired flexibility of lower extremity  Decreased range of motion of intervertebral discs of lumbar spine     Problem List Patient Active Problem List   Diagnosis Date Noted  . Drug-induced myopathy 05/11/2020  . Chronic bilateral low back pain without sciatica 12/23/2019  . Hyperlipidemia associated with type 2 diabetes mellitus ([place]) 11/11/2019  . Recurrent HSV (herpes simplex virus) 03/11/2019  . Arthritis of carpometacarpal [facility]) joint of left thumb 03/11/2019  . Obesity (BMI 30.0-34.9) 03/11/2019  . Status post total hip replacement, left 06/09/2016  . Hip pain 05/18/2016  . Type 2 diabetes mellitus with other specified complication ([place]) 41/32/4401  . Prostate cancer ([place]) 02/06/2014  . 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 Essential hypertension 12/06/2013  . Herpesviral infection of urogenital system  12/06/2013  . Male erectile dysfunction 12/06/2013  . Neck mass 12/06/2013   Pura Spice, PT, DPT # [id number], SPT 05/29/2020, 4:36 PM  [facility] [address] [place], [place], [address] Phone: [phone]   Fax:  [phone]  Name: Duane Grant MRN: [id number] Date of Birth: [birth year]

## 2020-06-01 DIAGNOSIS — C61 Malignant neoplasm of prostate: Secondary | ICD-10-CM | POA: Diagnosis not present

## 2020-06-02 ENCOUNTER — Ambulatory Visit: Payer: BC Managed Care – PPO | Admitting: Physical Therapy

## 2020-06-02 ENCOUNTER — Encounter: Payer: Self-pay | Admitting: Physical Therapy

## 2020-06-02 ENCOUNTER — Other Ambulatory Visit: Payer: Self-pay

## 2020-06-02 DIAGNOSIS — M5386 Other specified dorsopathies, lumbar region: Secondary | ICD-10-CM

## 2020-06-02 DIAGNOSIS — G8929 Other chronic pain: Secondary | ICD-10-CM

## 2020-06-02 DIAGNOSIS — M6281 Muscle weakness (generalized): Secondary | ICD-10-CM | POA: Diagnosis not present

## 2020-06-02 DIAGNOSIS — R29898 Other symptoms and signs involving the musculoskeletal system: Secondary | ICD-10-CM

## 2020-06-02 DIAGNOSIS — M545 Low back pain, unspecified: Secondary | ICD-10-CM | POA: Diagnosis not present

## 2020-06-02 NOTE — Therapy (Signed)
[place] [facility] [address] [place], [place], [address] Phone: [phone]   Fax:  [phone]  Physical Therapy Treatment  Patient Details  Name: Duane Grant MRN: [id number] Date of Birth: [birth year] Referring Provider (PT): Lamount Cranker MD   Encounter Date: 06/02/2020   PT End of Session - 06/02/20 0728    Visit Number 3    Number of Visits 9    Date for PT Re-Evaluation 06/23/20    Authorization - Visit Number 3    Authorization - Number of Visits 10    PT Start Time 0725    PT Stop Time 0811    PT Time Calculation (min) 46 min    Activity Tolerance Patient tolerated treatment well    Behavior During Therapy [facility] for tasks assessed/performed           Past Medical History:  Diagnosis Date  . Arthritis   . Cancer [facility]) 2009   Prostate, radiation but no surgery  . Elevated PSA   . H/O removal of cyst 12/02/2019   back   . History of prostate cancer   . Hyperlipidemia   . Hypertension     Past Surgical History:  Procedure Laterality Date  . HERNIA REPAIR  8841   Umbilical  . HERNIA REPAIR  2010  . Removal Fatty Tumor Right 2015   [facility], right side of chin/jaw  . TOTAL HIP ARTHROPLASTY Left 06/09/2016   Procedure: TOTAL HIP ARTHROPLASTY;  Surgeon: Thornton Park, MD;  Location: [facility] ORS;  Service: Orthopedics;  Laterality: Left;    There were no vitals filed for this visit.   Subjective Assessment - 06/02/20 0726    Subjective Reductions in stiffness and pain since previous session. Been cycling up to 30 min.    Limitations Other (comment)    Patient Stated Goals Decrease low back pain    Currently in Pain? No/denies    Pain Score 0-No pain          There.ex:   Sci-Fit for 5 min. L5 use of UE's and LE's.   Paloff Press walk outs: Red TB 3 steps out with 5 presses outside BOS adding rotation. Bilaterally.   Nautilus D1/D2 patterns with resistance: 40 lbs, 2x12 for D1 and 30 lbs for D2.  Min verbal and tactile cues for maintaining UE's straight throughout motion.   B standing hip abduction: 2x15, 3 lbs AW's.  B standing hip extension: 1x15, 3 lbs AW's.    PT Education - 06/02/20 6606    Education Details form/technique with exercise.    Person(s) Educated Patient    Methods Explanation;Demonstration;Tactile cues;Verbal cues    Comprehension Verbalized understanding;Returned demonstration               PT Long Term Goals - 05/26/20 0924      PT LONG TERM GOAL #1   Title Pt will improve FOTO score to 52 to demonstrate clinically significant improvement in functional mobility.    Baseline 10/12: 33    Time 4    Period Weeks    Status New    Target Date 06/23/20      PT LONG TERM GOAL #2   Title Pt will improve 5xSTS time by 20% to demonstrate clinically significant improvement in LE strength for squatting ADL's.    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    Baseline 10/12: pain and stiffness with driving in lumbar region at 5/10 NPS.    Time 4    Period Weeks    Status New    Target Date 06/23/20      PT LONG TERM GOAL #4   Title Pt will demonstrate full lumbar flex/exten AROM to improve pain free mobility.    Baseline 10/12: lumbar flex: 25 deg, lumbar ext: 15 deg    Time 4    Period Weeks    Status New    Target Date 06/23/20                 Plan - 06/02/20 0807    Clinical Impression Statement Pt progressed to upright core and LE exercises. Added D1/D2 resisted patterns for core strength with reports of minor LBP at end of second set due to core weakness. Pt required min verbal and tactile cues for form and educated how to perform at home with TB with pt verbalizing understanding. Pt can continue to  benefit from skilled PT treatment to improve functional mobility.    Personal Factors and Comorbidities Past/Current Experience;Comorbidity 2;Time since onset of injury/illness/exacerbation;Age    Examination-Activity Limitations Locomotion Level;Carry;Sleep;Squat;Stairs;Stand;Lift    Examination-Participation Restrictions Cleaning;Community Activity;Driving    Stability/Clinical Decision Making Evolving/Moderate complexity    Clinical Decision Making Low    Rehab Potential Good    PT Frequency 2x / week    PT Duration 4 weeks    PT Treatment/Interventions ADLs/Self Care Home Management;Cryotherapy;Electrical Stimulation;Gait training;Moist Heat;Traction;Neuromuscular re-education;Patient/family education;Dry needling;Manual techniques;Spinal Manipulations;Joint Manipulations    PT Next Visit Plan Lunges and squats    PT Home Exercise Plan Access Code: [email or web address]    Consulted and Agree with Plan of Care Patient           Patient will benefit from skilled therapeutic intervention in order to improve the following deficits and impairments:  Abnormal gait, Decreased range of motion, Difficulty walking, Increased muscle spasms, Decreased activity tolerance, Pain, Hypomobility, Impaired flexibility, Decreased mobility, Decreased strength  Visit Diagnosis: Chronic bilateral low back pain without sciatica  Muscle weakness (generalized)  Impaired flexibility of lower extremity  Decreased range of motion of intervertebral discs of lumbar spine     Problem List Patient Active Problem List   Diagnosis Date Noted  . Drug-induced myopathy 05/11/2020  . Chronic bilateral low back pain without sciatica 12/23/2019  . Hyperlipidemia associated with type 2 diabetes mellitus ([place]) 11/11/2019  . Recurrent HSV (herpes simplex virus) 03/11/2019  . Arthritis of carpometacarpal [facility]) joint of left thumb 03/11/2019  . Obesity (BMI 30.0-34.9) 03/11/2019  . Status post total hip replacement, left  06/09/2016  . Hip pain 05/18/2016  . Type 2 diabetes mellitus with other specified complication ([place]) 34/74/2595  . Prostate cancer ([place]) 02/06/2014  . 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 Essential hypertension 12/06/2013  . Herpesviral infection of urogenital system 12/06/2013  . Male erectile dysfunction 12/06/2013  . Neck mass 12/06/2013   Pura Spice, PT, DPT # [address], SPT 06/03/2020, 8:27 AM  [facility] [address] [place], [place], [address] Phone: [phone]   Fax:  [phone]  Name: SIRRON FRANCESCONI MRN: [id number] Date of Birth: 1955-11-28

## 2020-06-04 ENCOUNTER — Ambulatory Visit: Payer: BC Managed Care – PPO | Admitting: Physical Therapy

## 2020-06-04 ENCOUNTER — Other Ambulatory Visit: Payer: Self-pay

## 2020-06-04 ENCOUNTER — Encounter: Payer: Self-pay | Admitting: Physical Therapy

## 2020-06-04 DIAGNOSIS — G8929 Other chronic pain: Secondary | ICD-10-CM

## 2020-06-04 DIAGNOSIS — R29898 Other symptoms and signs involving the musculoskeletal system: Secondary | ICD-10-CM

## 2020-06-04 DIAGNOSIS — M5386 Other specified dorsopathies, lumbar region: Secondary | ICD-10-CM | POA: Diagnosis not present

## 2020-06-04 DIAGNOSIS — M6281 Muscle weakness (generalized): Secondary | ICD-10-CM

## 2020-06-04 DIAGNOSIS — M545 Low back pain, unspecified: Secondary | ICD-10-CM | POA: Diagnosis not present

## 2020-06-04 NOTE — Therapy (Signed)
[facility] [address] [place], [place], [address] Phone: [phone]   Fax:  [phone]  Physical Therapy Treatment  Patient Details  Name: RUVIM RISKO MRN: [id number] Date of Birth: 02-Dec-1955 Referring Provider (PT): Lamount Cranker MD   Encounter Date: 06/04/2020   PT End of Session - 06/04/20 0729    Visit Number 4    Number of Visits 9    Date for PT Re-Evaluation 06/23/20    Authorization - Visit Number 4    Authorization - Number of Visits 10    PT Start Time 7858    PT Stop Time 0816    PT Time Calculation (min) 52 min    Activity Tolerance Patient tolerated treatment well;No increased pain    Behavior During Therapy WFL for tasks assessed/performed           Past Medical History:  Diagnosis Date  . Arthritis   . Cancer [facility]) 2009   Prostate, radiation but no surgery  . Elevated PSA   . H/O removal of cyst 12/02/2019   back   . History of prostate cancer   . Hyperlipidemia   . Hypertension     Past Surgical History:  Procedure Laterality Date  . HERNIA REPAIR  8502   Umbilical  . HERNIA REPAIR  2010  . Removal Fatty Tumor Right 2015   [facility], right side of chin/jaw  . TOTAL HIP ARTHROPLASTY Left 06/09/2016   Procedure: TOTAL HIP ARTHROPLASTY;  Surgeon: Thornton Park, MD;  Location: [facility] ORS;  Service: Orthopedics;  Laterality: Left;    There were no vitals filed for this visit.   Subjective Assessment - 06/04/20 0728    Subjective Pt reports no pain this morning. Compliant with HEP. Minimal pain since previous session but after use of heating pad his pain was gone.    Limitations Other (comment)    Patient Stated Goals Decrease low back pain    Currently in Pain? No/denies    Pain Score 0-No pain          There.ex:   Sci-Fit L6 for 8 min. UE/LE use. Not billed.   Resisted Side-Steps with blue TB: 2x10 along floor ladder. Min verbal and tactile cues for form/technique.     Resisted hip extension walkouts at [employer]: x8 along agility ladder, 80 lbs with supervision form therapist.  Supine green exercise ball bridges to engage core stability with LE strength: 2x12, min verbal and tactile cues for set up and form.  Supine green exercise ball hamstring curls to engage core stability with LE strength: 2x12, min verbal and tactile cues for set up and form.   Green TB paloff press walk outs: 3 steps, 5 presses, x2 R and x2 L. Good form/technique.     PT Education - 06/04/20 0729    Education Details form/technique with exercise.    Person(s) Educated Patient    Methods Explanation;Demonstration;Tactile cues;Verbal cues    Comprehension Verbalized understanding;Returned demonstration               PT Long Term Goals - 05/26/20 0924      PT LONG TERM GOAL #1   Title Pt will improve FOTO score to 52 to demonstrate clinically significant improvement in functional mobility.    Baseline 10/12: 33    Time 4    Period Weeks    Status New    Target Date 06/23/20      PT LONG TERM GOAL #2  Title Pt will improve 5xSTS time by 20% to demonstrate clinically significant improvement in LE strength for squatting ADL's.    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    Baseline 10/12: pain and stiffness with driving in lumbar region at 5/10 NPS.    Time 4    Period Weeks    Status New    Target Date 06/23/20      PT LONG TERM GOAL #4   Title Pt will demonstrate full lumbar flex/exten AROM to improve pain free mobility.    Baseline 10/12: lumbar flex: 25 deg, lumbar ext: 15 deg    Time 4    Period Weeks    Status New    Target Date 06/23/20                 Plan - 06/04/20 0954    Clinical Impression Statement Pt  educated to perform resisted exercises for HEP every other day to give muscles a break to recover. Improved form/technique with exercise for core and LE's requiring very minimal verbal cues for form with excellent carryover. Pt progressing with reduction in pain symptoms. Pt can continue to benefit from skilled PT treatment to improve functional mobility.    Personal Factors and Comorbidities Past/Current Experience;Comorbidity 2;Time since onset of injury/illness/exacerbation;Age    Examination-Activity Limitations Locomotion Level;Carry;Sleep;Squat;Stairs;Stand;Lift    Examination-Participation Restrictions Cleaning;Community Activity;Driving    Stability/Clinical Decision Making Evolving/Moderate complexity    Clinical Decision Making Low    Rehab Potential Good    PT Frequency 2x / week    PT Duration 4 weeks    PT Treatment/Interventions ADLs/Self Care Home Management;Cryotherapy;Electrical Stimulation;Gait training;Moist Heat;Traction;Neuromuscular re-education;Patient/family education;Dry needling;Manual techniques;Spinal Manipulations;Joint Manipulations    PT Next Visit Plan Lunges and squats    PT Home Exercise Plan Access Code: [email or web address]    Consulted and Agree with Plan of Care Patient           Patient will benefit from skilled therapeutic intervention in order to improve the following deficits and impairments:  Abnormal gait, Decreased range of motion, Difficulty walking, Increased muscle spasms, Decreased activity tolerance, Pain, Hypomobility, Impaired flexibility, Decreased mobility, Decreased strength  Visit Diagnosis: Chronic bilateral low back pain without sciatica  Muscle weakness (generalized)  Impaired flexibility of lower extremity  Decreased range of motion of intervertebral discs of lumbar spine     Problem List Patient Active Problem List   Diagnosis Date Noted  . Drug-induced myopathy 05/11/2020  . Chronic bilateral low back pain without sciatica  12/23/2019  . Hyperlipidemia associated with type 2 diabetes mellitus ([place]) 11/11/2019  . Recurrent HSV (herpes simplex virus) 03/11/2019  . Arthritis of carpometacarpal [facility]) joint of left thumb 03/11/2019  . Obesity (BMI 30.0-34.9) 03/11/2019  . Status post total hip replacement, left 06/09/2016  . Hip pain 05/18/2016  . Type 2 diabetes mellitus with other specified complication ([place]) 66/44/0347  . Prostate cancer ([place]) 02/06/2014  . 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 Essential hypertension 12/06/2013  . Herpesviral infection of urogenital system 12/06/2013  . Male erectile dysfunction 12/06/2013  . Neck mass 12/06/2013   Pura Spice, PT, DPT # [address]  SPT 06/04/2020, 2:47 PM  [facility] [address] [place], [place], [address] Phone: [phone]   Fax:  [phone]  Name: JAYMOND WAAGE MRN: [id number] Date of Birth: 1955/10/20

## 2020-06-08 DIAGNOSIS — M5416 Radiculopathy, lumbar region: Secondary | ICD-10-CM | POA: Diagnosis not present

## 2020-06-09 ENCOUNTER — Ambulatory Visit: Payer: BC Managed Care – PPO | Admitting: Physical Therapy

## 2020-06-10 ENCOUNTER — Encounter: Payer: Self-pay | Admitting: Family Medicine

## 2020-06-10 DIAGNOSIS — H9192 Unspecified hearing loss, left ear: Secondary | ICD-10-CM | POA: Insufficient documentation

## 2020-06-11 ENCOUNTER — Encounter: Payer: Self-pay | Admitting: Physical Therapy

## 2020-06-11 ENCOUNTER — Ambulatory Visit: Payer: BC Managed Care – PPO

## 2020-06-11 ENCOUNTER — Other Ambulatory Visit: Payer: Self-pay

## 2020-06-11 DIAGNOSIS — G8929 Other chronic pain: Secondary | ICD-10-CM

## 2020-06-11 DIAGNOSIS — M5386 Other specified dorsopathies, lumbar region: Secondary | ICD-10-CM | POA: Diagnosis not present

## 2020-06-11 DIAGNOSIS — R29898 Other symptoms and signs involving the musculoskeletal system: Secondary | ICD-10-CM | POA: Diagnosis not present

## 2020-06-11 DIAGNOSIS — M545 Low back pain, unspecified: Secondary | ICD-10-CM

## 2020-06-11 DIAGNOSIS — M6281 Muscle weakness (generalized): Secondary | ICD-10-CM

## 2020-06-11 NOTE — Therapy (Signed)
[place] [facility] [address] [place], [place], [address] Phone: [phone]   Fax:  [phone]  Physical Therapy Treatment  Patient Details  Name: Duane Grant MRN: [id number] Date of Birth: October 05, 1955 Referring Provider (PT): Lamount Cranker MD   Encounter Date: 06/11/2020   PT End of Session - 06/11/20 0721    Visit Number 5    Number of Visits 9    Date for PT Re-Evaluation 06/23/20    Authorization - Visit Number 5    Authorization - Number of Visits 10    PT Start Time 0718    PT Stop Time 0809    PT Time Calculation (min) 51 min    Activity Tolerance Patient tolerated treatment well;No increased pain    Behavior During Therapy WFL for tasks assessed/performed           Past Medical History:  Diagnosis Date  . Arthritis   . Cancer [facility]) 2009   Prostate, radiation but no surgery  . Elevated PSA   . H/O removal of cyst 12/02/2019   back   . History of prostate cancer   . Hyperlipidemia   . Hypertension     Past Surgical History:  Procedure Laterality Date  . HERNIA REPAIR  7741   Umbilical  . HERNIA REPAIR  2010  . Removal Fatty Tumor Right 2015   [facility], right side of chin/jaw  . TOTAL HIP ARTHROPLASTY Left 06/09/2016   Procedure: TOTAL HIP ARTHROPLASTY;  Surgeon: Thornton Park, MD;  Location: [facility] ORS;  Service: Orthopedics;  Laterality: Left;    There were no vitals filed for this visit.   Subjective Assessment - 06/11/20 0720    Subjective Pt reports receiving steroid injection on Tuesday. No pain or stiffness in low back.    Limitations Other (comment)    Patient Stated Goals Decrease low back pain    Currently in Pain? No/denies    Pain Score 0-No pain           There.ex:   Sci-Fit L7 for 8 min. UE/LE use. Not billed.   Supine blue exercise ball bridges for core engagement: 2x12  Supine blue exercise ball hamstring curls for core engagement: 2x12  Supine blue exercise ball  lumbar trunk rotations for core engagement and mobility: 1x12 bilaterally  Standing alternating lunges: 2x6, single UE support. Min verbal and tactile cues.  STS from standard chair height: 2x12.Mod verbal and tactile cues for hip hinge and eccentric control. Good carryover after cuing.   Hip flexion stretch with knee on airex pad: 2x30 sec. Added contralateral rotation for deeper stretch. Min verbal and tactile cues.     PT Education - 06/11/20 2878    Education Details form/technique with exercise.    Person(s) Educated Patient    Methods Explanation;Demonstration;Tactile cues;Verbal cues    Comprehension Verbalized understanding;Returned demonstration               PT Long Term Goals - 05/26/20 0924      PT LONG TERM GOAL #1   Title Pt will improve FOTO score to 52 to demonstrate clinically significant improvement in functional mobility.    Baseline 10/12: 33    Time 4    Period Weeks    Status New    Target Date 06/23/20      PT LONG TERM GOAL #2   Title Pt will improve 5xSTS time by 20% to demonstrate clinically significant improvement in LE strength for squatting  ADL's.    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    Baseline 10/12: pain and stiffness with driving in lumbar region at 5/10 NPS.    Time 4    Period Weeks    Status New    Target Date 06/23/20      PT LONG TERM GOAL #4   Title Pt will demonstrate full lumbar flex/exten AROM to improve pain free mobility.    Baseline 10/12: lumbar flex: 25 deg, lumbar ext: 15 deg    Time 4    Period Weeks    Status New    Target Date 06/23/20                 Plan - 06/11/20 0810    Clinical Impression Statement Pt progressing with core and LE exercises with no pain or  stiffness. Focus on progressing LE exercises today with STS's and alternating lunges with reports of LE soreness. 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 Pt can continue to benefit from further skilled PT treatment to improve functional mobility.    Personal Factors and Comorbidities Past/Current Experience;Comorbidity 2;Time since onset of injury/illness/exacerbation;Age    Examination-Activity Limitations Locomotion Level;Carry;Sleep;Squat;Stairs;Stand;Lift    Examination-Participation Restrictions Cleaning;Community Activity;Driving    Stability/Clinical Decision Making Evolving/Moderate complexity    Clinical Decision Making Low    Rehab Potential Good    PT Frequency 2x / week    PT Duration 4 weeks    PT Treatment/Interventions ADLs/Self Care Home Management;Cryotherapy;Electrical Stimulation;Gait training;Moist Heat;Traction;Neuromuscular re-education;Patient/family education;Dry needling;Manual techniques;Spinal Manipulations;Joint Manipulations    PT Next Visit Plan Lunges and squats    PT Home Exercise Plan Access Code: [email or web address]    Consulted and Agree with Plan of Care Patient           Patient will benefit from skilled therapeutic intervention in order to improve the following deficits and impairments:  Abnormal gait, Decreased range of motion, Difficulty walking, Increased muscle spasms, Decreased activity tolerance, Pain, Hypomobility, Impaired flexibility, Decreased mobility, Decreased strength  Visit Diagnosis: Chronic bilateral low back pain without sciatica  Muscle weakness (generalized)  Impaired flexibility of lower extremity  Decreased range of motion of intervertebral discs of lumbar spine     Problem List Patient Active Problem List   Diagnosis Date Noted  . Hearing loss, left 06/10/2020  . Drug-induced myopathy  05/11/2020  . Chronic bilateral low back pain without sciatica 12/23/2019  . Hyperlipidemia associated with type 2 diabetes mellitus ([place]) 11/11/2019  . Recurrent HSV (herpes simplex virus) 03/11/2019  . Arthritis of carpometacarpal [facility]) joint of left thumb 03/11/2019  . Obesity (BMI 30.0-34.9) 03/11/2019  . Status post total hip replacement, left 06/09/2016  . Hip pain 05/18/2016  . Type 2 diabetes mellitus with other specified complication ([place]) 05/31/5101  . Prostate cancer ([place]) 02/06/2014  . 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 Essential hypertension 12/06/2013  . Herpesviral infection of urogenital system 12/06/2013  . Male erectile dysfunction 12/06/2013  . Neck mass 12/06/2013    Larna Daughters,  SPT 06/11/2020, 8:15 AM  [facility] [address] [place], [place], [address] Phone: [phone]   Fax:  [phone]  Name: Duane Grant MRN: [id number] Date of Birth: [birth year]

## 2020-06-15 DIAGNOSIS — M47896 Other spondylosis, lumbar region: Secondary | ICD-10-CM | POA: Diagnosis not present

## 2020-06-16 ENCOUNTER — Ambulatory Visit: Payer: BC Managed Care – PPO | Admitting: Physical Therapy

## 2020-06-18 ENCOUNTER — Encounter: Payer: BC Managed Care – PPO | Admitting: Physical Therapy

## 2020-06-22 ENCOUNTER — Other Ambulatory Visit: Payer: Self-pay

## 2020-06-22 ENCOUNTER — Ambulatory Visit: Payer: BC Managed Care – PPO | Attending: Orthopedic Surgery | Admitting: Physical Therapy

## 2020-06-22 ENCOUNTER — Encounter: Payer: Self-pay | Admitting: Physical Therapy

## 2020-06-22 DIAGNOSIS — M6281 Muscle weakness (generalized): Secondary | ICD-10-CM | POA: Diagnosis not present

## 2020-06-22 DIAGNOSIS — M545 Low back pain, unspecified: Secondary | ICD-10-CM | POA: Diagnosis not present

## 2020-06-22 DIAGNOSIS — R29898 Other symptoms and signs involving the musculoskeletal system: Secondary | ICD-10-CM | POA: Diagnosis not present

## 2020-06-22 DIAGNOSIS — G8929 Other chronic pain: Secondary | ICD-10-CM | POA: Insufficient documentation

## 2020-06-22 DIAGNOSIS — M5386 Other specified dorsopathies, lumbar region: Secondary | ICD-10-CM | POA: Diagnosis not present

## 2020-06-22 NOTE — Therapy (Signed)
[facility] [address] [place], [place], [address] Phone: [phone]   Fax:  [phone]  Physical Therapy Treatment/Discharge summary  Patient Details  Name: Duane Grant MRN: [id number] Date of Birth: 05-20-1956 Referring Provider (PT): Lamount Cranker MD   Encounter Date: 06/22/2020   PT End of Session - 06/22/20 0722    Visit Number 6    Number of Visits 9    Date for PT Re-Evaluation 06/23/20    Authorization - Visit Number 6    Authorization - Number of Visits 10    PT Start Time 0719    PT Stop Time 0808    PT Time Calculation (min) 49 min    Activity Tolerance Patient tolerated treatment well;No increased pain    Behavior During Therapy WFL for tasks assessed/performed           Past Medical History:  Diagnosis Date  . Arthritis   . Cancer [facility]) 2009   Prostate, radiation but no surgery  . Elevated PSA   . H/O removal of cyst 12/02/2019   back   . History of prostate cancer   . Hyperlipidemia   . Hypertension     Past Surgical History:  Procedure Laterality Date  . HERNIA REPAIR  3329   Umbilical  . HERNIA REPAIR  2010  . Removal Fatty Tumor Right 2015   [facility], right side of chin/jaw  . TOTAL HIP ARTHROPLASTY Left 06/09/2016   Procedure: TOTAL HIP ARTHROPLASTY;  Surgeon: Thornton Park, MD;  Location: [facility] ORS;  Service: Orthopedics;  Laterality: Left;    There were no vitals filed for this visit.   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 No pain this a.m. in his low back.    Limitations Other (comment)    Patient Stated Goals Decrease low back pain    Currently in Pain? No/denies    Pain Score 0-No pain          Neuro Re-Ed:   Reassessment of goals with driving and LBP, FOTO score, lumbar AROM, and 5xSTS. Refere to clinical impression and long term goals  session for updates.   There.ex:   4 way resisted walking at [employer]: 95 lbs, x5/direction. Min verbal cues for form/technique.   Resisted side steps with anti-rotation: blue TB, 3 steps, 5 punches forwards and outside BOS to L/R. x5 R, x5 L   Updated HEP and educated pt on ways to progress previous exercises. Pt verbalized understanding and indep with exercises at home.    PT Education - 06/22/20 5188    Education Details form/technique with exercise.    Person(s) Educated Patient    Methods Explanation;Demonstration;Tactile cues;Verbal cues    Comprehension Verbalized understanding;Returned demonstration               PT Long Term Goals - 06/22/20 0723      PT LONG TERM GOAL #1   Title Pt will improve FOTO score to 52 to demonstrate clinically significant improvement in functional mobility.    Baseline 10/12: 33; 11/8: 94/52    Time 4    Period Weeks    Status Achieved    Target Date 06/22/20      PT LONG TERM GOAL #2   Title Pt will improve 5xSTS time by 20% to demonstrate clinically significant improvement in LE strength  for squatting ADL's.    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    Baseline 10/12: pain and stiffness with driving in lumbar region at 5/10 NPS; 4/10 NPS but with lumbar support no pain.    Time 4    Period Weeks    Status Partially Met    Target Date 06/22/20      PT LONG TERM GOAL #4   Title Pt will demonstrate full lumbar flex/exten AROM to improve pain free mobility.    Baseline 10/12: lumbar flex: 25 deg, lumbar ext: 15 deg; 06/22/2020: lumbar flex: 35 deg, lumbar ext: 15 deg    Time 4    Period Weeks    Status Partially Met    Target Date 06/22/20                 Plan -  06/22/20 0738    Clinical Impression Statement Pt ready for D/c from therapy. Pt accomplished FOTO goals and 5xSTS. Pt scored a 94 on FOTO with a goal of 52 and improved 5xSTS from 48.6 to 12.2 sec displaying significant improvement in BLE strength and overall functional mobility. Pt displayed improved lumbar flexion AROM to 25 deg however extension remained at 15 degs. 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 Pt re-educated and given updated progressive HEP with no questions or concerns displaying indep with HEP. Pt educated to contact PT for any future needs or questions about updating HEP.    Personal Factors and Comorbidities Past/Current Experience;Comorbidity 2;Time since onset of injury/illness/exacerbation;Age    Examination-Activity Limitations Locomotion Level;Carry;Sleep;Squat;Stairs;Stand;Lift    Examination-Participation Restrictions Cleaning;Community Activity;Driving    Stability/Clinical Decision Making Evolving/Moderate complexity    Clinical Decision Making Low    Rehab Potential Good    PT Frequency 2x / week    PT Duration 4 weeks    PT Treatment/Interventions ADLs/Self Care Home Management;Cryotherapy;Electrical Stimulation;Gait training;Moist Heat;Traction;Neuromuscular re-education;Patient/family education;Dry needling;Manual techniques;Spinal Manipulations;Joint Manipulations    PT Next Visit Plan D/c    PT Home Exercise Plan Access Code: [email or web address]    Consulted and Agree with Plan of Care Patient           Patient will benefit from skilled therapeutic intervention in order to improve the following deficits and impairments:  Abnormal gait, Decreased range of motion, Difficulty walking, Increased muscle spasms, Decreased activity tolerance, Pain, Hypomobility, Impaired  flexibility, Decreased mobility, Decreased strength  Visit Diagnosis: Chronic bilateral low back pain without sciatica  Muscle weakness (generalized)  Impaired flexibility of lower extremity  Decreased range of motion of intervertebral discs of lumbar spine     Problem List Patient Active Problem List   Diagnosis Date Noted  . Hearing loss, left 06/10/2020  . Drug-induced myopathy 05/11/2020  . Chronic bilateral low back pain without sciatica 12/23/2019  . Hyperlipidemia associated with type 2 diabetes mellitus ([facility]) 11/11/2019  . Recurrent HSV (herpes simplex virus) 03/11/2019  .  Arthritis of carpometacarpal [facility]) joint of left thumb 03/11/2019  . Obesity (BMI 30.0-34.9) 03/11/2019  . Status post total hip replacement, left 06/09/2016  . Hip pain 05/18/2016  . Type 2 diabetes mellitus with other specified complication ([place]) 62/83/6629  . Prostate cancer ([place]) 02/06/2014  . 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 Essential hypertension 12/06/2013  . Herpesviral infection of urogenital system 12/06/2013  . Male erectile dysfunction 12/06/2013  . Neck mass 12/06/2013   Pura Spice, PT, DPT # [id number] SPT 06/22/2020, 8:29 AM  [facility] [address] [place], [place], [address] Phone: [phone]   Fax:  [phone]  Name: Duane Grant MRN: [id number] Date of Birth: 27-Mar-1956

## 2020-06-23 ENCOUNTER — Encounter: Payer: BC Managed Care – PPO | Admitting: Physical Therapy

## 2020-06-25 ENCOUNTER — Encounter: Payer: BC Managed Care – PPO | Admitting: Physical Therapy

## 2020-06-30 ENCOUNTER — Encounter: Payer: BC Managed Care – PPO | Admitting: Physical Therapy

## 2020-07-02 ENCOUNTER — Encounter: Payer: BC Managed Care – PPO | Admitting: Physical Therapy

## 2020-07-07 ENCOUNTER — Encounter: Payer: BC Managed Care – PPO | Admitting: Physical Therapy

## 2020-08-24 LAB — HM DIABETES EYE EXAM

## 2020-08-26 ENCOUNTER — Other Ambulatory Visit: Payer: Self-pay

## 2020-08-26 ENCOUNTER — Encounter: Payer: Self-pay | Admitting: Family Medicine

## 2020-08-26 ENCOUNTER — Encounter: Payer: Medicare Other | Admitting: Family Medicine

## 2020-08-26 ENCOUNTER — Ambulatory Visit: Payer: Self-pay | Admitting: Family Medicine

## 2020-08-26 VITALS — BP 130/70 | HR 86 | Temp 98.7°F | Resp 17 | Ht 68.0 in | Wt 217.0 lb

## 2020-08-26 DIAGNOSIS — Z024 Encounter for examination for driving license: Secondary | ICD-10-CM

## 2020-08-26 NOTE — Assessment & Plan Note (Signed)
DOT Certificate provided x 1 year (T2DM, HTN)  Hearing test: Pass at 15' Vision: 20/25 R, 20/25 L, 20/15 Both Corrected  Urine 1.030, Neg Protein, Neg Glucose, Neg Hematuria  A1C 7.3%, follows closely with PCP

## 2020-08-26 NOTE — Progress Notes (Signed)
Subjective:    Patient ID: Duane Grant, male    DOB: Jul 08, 1956, 65 y.o.   MRN: [id number]  Duane Grant is a 65 y.o. male presenting on 08/26/2020 for Employment Physical (DOT Physical)   HPI  Patient presents to clinic for DOT PE  Depression screen [facility] 2/9 05/11/2020 12/23/2019 12/17/2019  Decreased Interest 0 0 0  Down, Depressed, Hopeless 0 0 0  PHQ - 2 Score 0 0 0    Social History   Tobacco Use  . Smoking status: Former Smoker    Packs/day: 0.50    Years: 35.00    Pack years: 17.50    Types: Cigarettes    Quit date: 11/14/1994    Years since quitting: 25.8  . Smokeless tobacco: Former Network engineer  . Vaping Use: Never used  Substance Use Topics  . Alcohol use: Yes    Comment: occassional  . Drug use: No    Review of Systems  Constitutional: Negative.   HENT: Negative.   Eyes: Negative.   Respiratory: Negative.   Cardiovascular: Negative.   Gastrointestinal: Negative.   Endocrine: Negative.   Genitourinary: Negative.   Musculoskeletal: Negative.   Skin: Negative.   Allergic/Immunologic: Negative.   Neurological: Negative.   Hematological: Negative.   Psychiatric/Behavioral: Negative.    Per HPI unless specifically indicated above     Objective:    BP 130/70 (BP Location: Left Arm, Patient Position: Sitting, Cuff Size: Large)   Pulse 86   Temp 98.7 F (37.1 C) (Temporal)   Resp 17   Ht 5\' 8"  (1.727 m)   Wt 217 lb (98.4 kg)   SpO2 100%   BMI 32.99 kg/m   Wt Readings from Last 3 Encounters:  08/26/20 217 lb (98.4 kg)  05/23/20 214 lb 15.2 oz (97.5 kg)  05/16/20 215 lb (97.5 kg)    Physical Exam Vitals reviewed.  Constitutional:      General: He is not in acute distress.    Appearance: Normal appearance. He is well-developed and well-groomed. He is not ill-appearing or toxic-appearing.  HENT:     Head: Normocephalic.     Right Ear: Tympanic membrane, ear canal and external ear normal. There is no impacted cerumen.     Left Ear:  Tympanic membrane, ear canal and external ear normal. There is no impacted cerumen.     Nose: Nose normal. No congestion or rhinorrhea.     Mouth/Throat:     Mouth: Mucous membranes are moist.     Pharynx: Oropharynx is clear. No oropharyngeal exudate or posterior oropharyngeal erythema.  Eyes:     General: Lids are normal. Vision grossly intact. No scleral icterus.       Right eye: No discharge.        Left eye: No discharge.     Extraocular Movements: Extraocular movements intact.     Conjunctiva/sclera: Conjunctivae normal.     Pupils: Pupils are equal, round, and reactive to light.  Cardiovascular:     Rate and Rhythm: Normal rate and regular rhythm.     Pulses: Normal pulses.          Dorsalis pedis pulses are 2+ on the right side and 2+ on the left side.     Heart sounds: Normal heart sounds. No murmur heard. No friction rub. No gallop.   Pulmonary:     Effort: Pulmonary effort is normal. No respiratory distress.     Breath sounds: Normal breath sounds. No wheezing, rhonchi or  rales.  Abdominal:     General: Abdomen is flat. Bowel sounds are normal. There is no distension.     Palpations: Abdomen is soft. There is no hepatomegaly, splenomegaly or mass.     Tenderness: There is no abdominal tenderness. There is no right CVA tenderness, left CVA tenderness, guarding or rebound.     Hernia: No hernia is present.  Musculoskeletal:        General: Normal range of motion.     Cervical back: Normal range of motion and neck supple. No rigidity or tenderness.     Right lower leg: No edema.     Left lower leg: No edema.     Comments: Normal tone, 5/5 strength BUE & BLE  Feet:     Right foot:     Skin integrity: Skin integrity normal.     Left foot:     Skin integrity: Skin integrity normal.  Lymphadenopathy:     Cervical: No cervical adenopathy.  Skin:    General: Skin is warm and dry.     Capillary Refill: Capillary refill takes less than 2 seconds.  Neurological:     General:  No focal deficit present.     Mental Status: He is alert and oriented to person, place, and time.     Cranial Nerves: Cranial nerves are intact. No cranial nerve deficit.     Sensory: Sensation is intact. No sensory deficit.     Motor: Motor function is intact. No weakness.     Coordination: Coordination is intact. Coordination normal.     Gait: Gait is intact. Gait normal.     Deep Tendon Reflexes: Reflexes are normal and symmetric. Reflexes normal.  Psychiatric:        Attention and Perception: Attention and perception normal.        Mood and Affect: Mood and affect normal.        Speech: Speech normal.        Behavior: Behavior normal. Behavior is cooperative.        Thought Content: Thought content normal.        Cognition and Memory: Cognition and memory normal.        Judgment: Judgment normal.    Results for orders placed or performed in visit on 05/11/20  POCT HgB A1C  Result Value Ref Range   Hemoglobin A1C 7.3 (A) 4.0 - 5.6 %      Assessment & Plan:   Problem List Items Addressed This Visit      Other   Encounter for Department of Transportation (DOT) examination for trucking license - Primary    DOT Certificate provided x 1 year (T2DM, HTN)  Hearing test: Pass at 15' Vision: 20/25 R, 20/25 L, 20/15 Both Corrected  Urine 1.030, Neg Protein, Neg Glucose, Neg Hematuria  A1C 7.3%, follows closely with PCP         No orders of the defined types were placed in this encounter.  Follow up plan: No follow-ups on file.   Harlin Grant, [facility] Practition[facility] 08/26/2020, 8:27 AM

## 2020-09-07 DIAGNOSIS — Z923 Personal history of irradiation: Secondary | ICD-10-CM | POA: Diagnosis not present

## 2020-09-07 DIAGNOSIS — Z79899 Other long term (current) drug therapy: Secondary | ICD-10-CM | POA: Diagnosis not present

## 2020-09-07 DIAGNOSIS — Z8546 Personal history of malignant neoplasm of prostate: Secondary | ICD-10-CM | POA: Diagnosis not present

## 2020-09-07 DIAGNOSIS — Z87891 Personal history of nicotine dependence: Secondary | ICD-10-CM | POA: Diagnosis not present

## 2020-09-07 DIAGNOSIS — C61 Malignant neoplasm of prostate: Secondary | ICD-10-CM | POA: Diagnosis not present

## 2020-09-07 DIAGNOSIS — R232 Flushing: Secondary | ICD-10-CM | POA: Diagnosis not present

## 2020-09-11 ENCOUNTER — Encounter: Payer: Self-pay | Admitting: Family Medicine

## 2020-10-16 ENCOUNTER — Other Ambulatory Visit: Payer: Self-pay | Admitting: *Deleted

## 2020-10-16 DIAGNOSIS — E66811 Obesity, class 1: Secondary | ICD-10-CM

## 2020-10-16 DIAGNOSIS — Z1159 Encounter for screening for other viral diseases: Secondary | ICD-10-CM

## 2020-10-16 DIAGNOSIS — E1169 Type 2 diabetes mellitus with other specified complication: Secondary | ICD-10-CM

## 2020-10-16 DIAGNOSIS — E669 Obesity, unspecified: Secondary | ICD-10-CM

## 2020-10-16 DIAGNOSIS — I10 Essential (primary) hypertension: Secondary | ICD-10-CM

## 2020-10-26 ENCOUNTER — Other Ambulatory Visit: Payer: Self-pay

## 2020-10-26 ENCOUNTER — Other Ambulatory Visit: Payer: BC Managed Care – PPO

## 2020-10-26 DIAGNOSIS — E669 Obesity, unspecified: Secondary | ICD-10-CM | POA: Diagnosis not present

## 2020-10-26 DIAGNOSIS — I10 Essential (primary) hypertension: Secondary | ICD-10-CM | POA: Diagnosis not present

## 2020-10-26 DIAGNOSIS — E785 Hyperlipidemia, unspecified: Secondary | ICD-10-CM | POA: Diagnosis not present

## 2020-10-26 DIAGNOSIS — Z1159 Encounter for screening for other viral diseases: Secondary | ICD-10-CM | POA: Diagnosis not present

## 2020-10-26 DIAGNOSIS — E1169 Type 2 diabetes mellitus with other specified complication: Secondary | ICD-10-CM | POA: Diagnosis not present

## 2020-10-27 LAB — LIPID PANEL
Cholesterol: 183 mg/dL (ref <200)
HDL: 43 mg/dL (ref >=40)
LDL Cholesterol (Calc): 112 mg/dL (calc) — ABNORMAL HIGH
Non-HDL Cholesterol (Calc): 140 mg/dL (calc) — ABNORMAL HIGH (ref <130)
Total CHOL/HDL Ratio: 4.3 (calc) (ref <5.0)
Triglycerides: 160 mg/dL — ABNORMAL HIGH (ref <150)

## 2020-10-27 LAB — CBC WITH DIFFERENTIAL/PLATELET
Absolute Monocytes: 472 cells/uL (ref 200–950)
Basophils Absolute: 59 cells/uL (ref 0–200)
Basophils Relative: 1 %
Eosinophils Absolute: 89 cells/uL (ref 15–500)
Eosinophils Relative: 1.5 %
HCT: 38.8 % (ref 38.5–50.0)
Hemoglobin: 13.5 g/dL (ref 13.2–17.1)
Lymphs Abs: 1333 cells/uL (ref 850–3900)
MCH: 32.5 pg (ref 27.0–33.0)
MCHC: 34.8 g/dL (ref 32.0–36.0)
MCV: 93.5 fL (ref 80.0–100.0)
MPV: 10.3 fL (ref 7.5–12.5)
Monocytes Relative: 8 %
Neutro Abs: 3947 cells/uL (ref 1500–7800)
Neutrophils Relative %: 66.9 %
Platelets: 307 10*3/uL (ref 140–400)
RBC: 4.15 10*6/uL — ABNORMAL LOW (ref 4.20–5.80)
RDW: 11.8 % (ref 11.0–15.0)
Total Lymphocyte: 22.6 %
WBC: 5.9 10*3/uL (ref 3.8–10.8)

## 2020-10-27 LAB — COMPLETE METABOLIC PANEL WITH GFR
AG Ratio: 1.5 (calc) (ref 1.0–2.5)
ALT: 28 U/L (ref 9–46)
AST: 16 U/L (ref 10–35)
Albumin: 4.1 g/dL (ref 3.6–5.1)
Alkaline phosphatase (APISO): 62 U/L (ref 35–144)
BUN: 12 mg/dL (ref 7–25)
CO2: 26 mmol/L (ref 20–32)
Calcium: 9.5 mg/dL (ref 8.6–10.3)
Chloride: 103 mmol/L (ref 98–110)
Creat: 0.85 mg/dL (ref 0.70–1.25)
GFR, Est African American: 104 mL/min/{1.73_m2} (ref >=60)
GFR, Est Non African American: 90 mL/min/{1.73_m2} (ref >=60)
Globulin: 2.7 g/dL (calc) (ref 1.9–3.7)
Glucose, Bld: 155 mg/dL — ABNORMAL HIGH (ref 65–99)
Potassium: 4.2 mmol/L (ref 3.5–5.3)
Sodium: 139 mmol/L (ref 135–146)
Total Bilirubin: 0.5 mg/dL (ref 0.2–1.2)
Total Protein: 6.8 g/dL (ref 6.1–8.1)

## 2020-10-27 LAB — HEMOGLOBIN A1C
Hgb A1c MFr Bld: 7.3 % of total Hgb — ABNORMAL HIGH (ref <5.7)
Mean Plasma Glucose: 163 mg/dL
eAG (mmol/L): 9 mmol/L

## 2020-10-27 LAB — HEPATITIS C ANTIBODY
Hepatitis C Ab: NONREACTIVE
SIGNAL TO CUT-OFF: 0.01 (ref <1.00)

## 2020-10-27 LAB — TSH: TSH: 1.25 mIU/L (ref 0.40–4.50)

## 2020-10-28 ENCOUNTER — Other Ambulatory Visit: Payer: Self-pay | Admitting: Family Medicine

## 2020-10-28 ENCOUNTER — Encounter: Payer: Self-pay | Admitting: Family Medicine

## 2020-10-28 ENCOUNTER — Other Ambulatory Visit: Payer: Self-pay

## 2020-10-28 ENCOUNTER — Ambulatory Visit (INDEPENDENT_AMBULATORY_CARE_PROVIDER_SITE_OTHER): Payer: Medicare HMO | Admitting: Family Medicine

## 2020-10-28 VITALS — BP 122/79 | HR 78 | Ht 68.0 in | Wt 218.0 lb

## 2020-10-28 DIAGNOSIS — I10 Essential (primary) hypertension: Secondary | ICD-10-CM | POA: Diagnosis not present

## 2020-10-28 DIAGNOSIS — Z0000 Encounter for general adult medical examination without abnormal findings: Secondary | ICD-10-CM

## 2020-10-28 DIAGNOSIS — E785 Hyperlipidemia, unspecified: Secondary | ICD-10-CM

## 2020-10-28 DIAGNOSIS — E1169 Type 2 diabetes mellitus with other specified complication: Secondary | ICD-10-CM | POA: Diagnosis not present

## 2020-10-28 DIAGNOSIS — G720 Drug-induced myopathy: Secondary | ICD-10-CM

## 2020-10-28 MED ORDER — PRAVASTATIN SODIUM 10 MG PO TABS: 10.0000 mg | ORAL_TABLET | Freq: Every day | ORAL | 3 refills | Status: DC

## 2020-10-28 NOTE — Assessment & Plan Note (Signed)
A1c stable at 7.3 Improved on GLP1 Complications - hyperglycemia, hyperlipidemia - increases risk of future cardiovascular complications  - Failed Jardiance d/t yeast infection, off in 2020  Plan:  1. Continue Ozempic 1mg  weekly - in future can increase dosage when available if needed 2.  Reduce Metformin from 500 BID down to 500mg  PM w/ meal, goal to come off 3. Encourage improved lifestyle - low carb, low sugar diet, reduce portion size, continue improving regular exercise 4. Check CBG , bring log to next visit for review 5. Continue ASA, ACEi - previously on Statin - but now will restart trial on other statin Pravastatin lower dose.

## 2020-10-28 NOTE — Assessment & Plan Note (Signed)
Will trial other statin Dosing instructions given

## 2020-10-28 NOTE — Assessment & Plan Note (Signed)
Controlled HTN - Home BP readings reviewed, normal  No known complications  Passed DOT physical    Plan:  1. Continue current BP regimen -  Lisinopril-HCTZ 10-12.5mg  (HALF pill) daily - has refill 2. Encourage improved lifestyle - low sodium diet, regular exercise 3. Continue monitor BP outside office, bring readings to next visit, if persistently >140/90 or new symptoms notify office sooner

## 2020-10-28 NOTE — Progress Notes (Signed)
Subjective:    Patient ID: Duane Grant, male    DOB: 15-Dec-1955, 65 y.o.   MRN: [id number]  Duane Grant is a 65 y.o. male presenting on 10/28/2020 for Annual Exam, Hypertension, and Diabetes   HPI   Here for Annual Physical and Lab Review.  CHRONIC DM, Type 2: Hyperlipidemia Previous history A1c,trend from 9 > 8, most recent results had been A1c 6.9 to 7.3 Last result A1c 7.3 Meds:Metformin 500mg  BID with meals, Ozempic 1mg  weekly inj Reports good compliance. 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  Prostate Cancer Followed by by [facility] - Dr Lawanna Kobus - S/p Radiation 2010, PSA was up to 19 in 08/2019, then treated with Trelstar (ADT) - last dose ADT 09/07/20 - he takes OTC Black Cohosh for hormonal symptoms. Still followed by [facility], last seen 08/2020 On Flomax - he says sometimes he has some bad days with difficulties with urination he will ask Urology about adjusting alpha blocker in future.  CHRONIC HTN: Reportshome BP readings 130 avg without concern, had DOT physical already in 2022 Current Meds -Lisinopril-HCTZ 10-12.5mg  (HALF pill) daily - has refills Reports good compliance, took meds today. Tolerating well, w/o complaints. Denies CP, dyspnea, HA, edema, dizziness / lightheadedness  Chronic Low Back Pain Last visit 12/23/19 with me, he was referred back to [employer] he got 2nd opinion with [facility], saw Carlynn Spry PA, has had Lumbar MRI - PT regimen in past. - On methocarbamol, has plenty of med, takes PRN  Statin Myalgia Drug induced myopathy Hyperlipidemia Previously on Rosuvastatin 20mg  - had stopped this due to myalgia. Did not try other dose or other statin Lab mild elevated LDL 112,  improved total, HDL, and mild elevated TG Open to trial of diff one today  History of Recurrent Genital HSV No flare up recently. On Valtrex PRN only. Needs refill  Reduced Hearing, Left ear Reports chronic gradual decline in hearing, Left ear is worse. He would like a hearing test.  He has his DOT Physical Completed.   Health Maintenance: UTD COVID19 vaccine 11/2019 and 12/2019 Pfizer completed  Shingrix vaccine updated.  Colon CA Screening: Never had colonoscopy. Cologuard negative 11/19/19 - next due 11/2022 (3 yr)   Depression screen [facility] 2/9 05/11/2020 12/23/2019 12/17/2019  Decreased Interest 0 0 0  Down, Depressed, Hopeless 0 0 0  PHQ - 2 Score 0 0 0    Past Medical History:  Diagnosis Date  . Arthritis   . Cancer [facility]) 2009   Prostate, radiation but no surgery  . Elevated PSA   . H/O removal of cyst 12/02/2019   back   . History of prostate cancer   . Hyperlipidemia   . Hypertension    Past Surgical History:  Procedure Laterality Date  . HERNIA REPAIR  1884   Umbilical  . HERNIA REPAIR  2010  . Removal Fatty Tumor Right 2015   [facility], right side of chin/jaw  . 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 Marital status: Married    Spouse name: Not on file  . Number of children: Not on file  .  Years of education: [employer]  . Highest education level: High school graduate  Occupational History  . Not on file  Tobacco Use  . Smoking status: Former Smoker    Packs/day: 0.50    Years: 35.00    Pack years: 17.50    Types: Cigarettes    Quit date: 11/14/1994    Years since quitting: 25.9  . Smokeless tobacco: Former Network engineer  . Vaping Use: Never used  Substance and Sexual Activity  . Alcohol use: Yes    Comment: occassional  . Drug use: No  . Sexual activity:  Yes    Birth control/protection: None  Other Topics Concern  . Not on file  Social History Narrative  . Not on file   Social Determinants of Health   Financial Resource Strain: Low Risk   . Difficulty of Paying Living Expenses: Not hard at all  Food Insecurity: No Food Insecurity  . Worried About Charity fundraiser in the Last Year: Never true  . Ran Out of Food in the Last Year: Never true  Transportation Needs: No Transportation Needs  . Lack of Transportation (Medical): No  . Lack of Transportation (Non-Medical): No  Physical Activity: Insufficiently Active  . Days of Exercise per Week: 3 days  . Minutes of Exercise per Session: 30 min  Stress: Not on file  Social Connections: Moderately Isolated  . Frequency of Communication with Friends and Family: More than three times a week  . Frequency of Social Gatherings with Friends and Family: More than three times a week  . Attends Religious Services: Never  . Active Member of Clubs or Organizations: No  . Attends Archivist Meetings: Never  . Marital Status: Married  Human resources officer Violence: Not on file   Family History  Problem Relation Age of Onset  . Diabetes Mother   . Hypertension Mother    Current Outpatient Medications on File Prior to Visit  Medication Sig  . methocarbamol (ROBAXIN) 750 MG tablet Take 750 mg by mouth 3 (three) times daily.  Marland Kitchen lisinopril-hydrochlorothiazide (ZESTORETIC) 10-12.5 MG tablet Take 0.5 tablets by mouth daily.  . metFORMIN (GLUCOPHAGE) 500 MG tablet Take 1 tablet (500 mg total) by mouth daily with supper.  . Omega-3 Fatty Acids (FISH OIL) 1200 MG CAPS Take 1 capsule by mouth 2 (two) times daily.  Marland Kitchen OZEMPIC, 1 MG/DOSE, 4 MG/3ML SOPN Inject 1 mg into the skin once a week.  . tamsulosin (FLOMAX) 0.4 MG CAPS capsule Take 1 capsule (0.4 mg total) by mouth daily after supper.  . valACYclovir (VALTREX) 1000 MG tablet Take 1 tablet (1,000 mg total) by mouth daily as needed (HSV flare).  For up to 5-7 as needed for flare, can repeat. (Patient not taking: Reported on 10/28/2020)   No current facility-administered medications on file prior to visit.    Review of Systems  Constitutional: Negative for activity change, appetite change, chills, diaphoresis, fatigue and fever.  HENT: Negative for congestion and hearing loss.   Eyes: Negative for visual disturbance.  Respiratory: Negative for cough, chest tightness, shortness of breath and wheezing.   Cardiovascular: Negative for chest pain, palpitations and leg swelling.  Gastrointestinal: Negative for abdominal pain, constipation, diarrhea, nausea and vomiting.  Endocrine: Negative for cold intolerance.  Genitourinary: Negative for dysuria, frequency and hematuria.  Musculoskeletal: Negative for arthralgias and neck pain.  Skin: Negative for rash.  Allergic/Immunologic: Negative for environmental allergies.  Neurological: Negative for dizziness, weakness, light-headedness, numbness and headaches.  Hematological: Negative for adenopathy.  Psychiatric/Behavioral: Negative for behavioral problems, dysphoric mood and sleep disturbance.   Per HPI unless specifically indicated above      Objective:    BP 122/79   Pulse 78   Ht 5\' 8"  (1.727 m)   Wt 218 lb (98.9 kg)   SpO2 98%   BMI 33.15 kg/m   Wt Readings from Last 3 Encounters:  10/28/20 218 lb (98.9 kg)  08/26/20 217 lb (98.4 kg)  05/23/20 214 lb 15.2 oz (97.5 kg)    Physical Exam Vitals and nursing note reviewed.  Constitutional:      General: He is not in acute distress.    Appearance: He is well-developed. He is not diaphoretic.     Comments: Well-appearing, comfortable, cooperative  HENT:     Head: Normocephalic and atraumatic.     Right Ear: Tympanic membrane, ear canal and external ear normal. There is no impacted cerumen.     Left Ear: Tympanic membrane, ear canal and external ear normal. There is no impacted cerumen.  Eyes:     General:        Right  eye: No discharge.        Left eye: No discharge.     Conjunctiva/sclera: Conjunctivae normal.     Pupils: Pupils are equal, round, and reactive to light.  Neck:     Thyroid: No thyromegaly.     Vascular: No carotid bruit.  Cardiovascular:     Rate and Rhythm: Normal rate and regular rhythm.     Heart sounds: Normal heart sounds. No murmur heard.   Pulmonary:     Effort: Pulmonary effort is normal. No respiratory distress.     Breath sounds: Normal breath sounds. No wheezing or rales.  Abdominal:     General: Bowel sounds are normal. There is no distension.     Palpations: Abdomen is soft. There is no mass.     Tenderness: There is no abdominal tenderness.  Musculoskeletal:        General: No tenderness. Normal range of motion.     Cervical back: Normal range of motion and neck supple.     Right lower leg: No edema.     Left lower leg: No edema.     Comments: Upper / Lower Extremities: - Normal muscle tone, strength bilateral upper extremities 5/5, lower extremities 5/5  Lymphadenopathy:     Cervical: No cervical adenopathy.  Skin:    General: Skin is warm and dry.     Findings: No erythema or rash.  Neurological:     Mental Status: He is alert and oriented to person, place, and time.     Comments: Distal sensation intact to light touch all extremities  Psychiatric:        Behavior: Behavior normal.     Comments: Well groomed, good eye contact, normal speech and thoughts      Recent Labs    11/11/19 0919 05/11/20 0820 10/26/20 0822  HGBA1C 6.9* 7.3* 7.3*    Results for orders placed or performed in visit on 10/16/20  Hepatitis C antibody  Result Value Ref Range   Hepatitis C Ab NON-REACTIVE NON-REACTI   SIGNAL TO CUT-OFF 0.01 <1.00  TSH  Result Value Ref Range   TSH 1.25 0.40 - 4.50 mIU/L  Lipid panel  Result Value Ref Range   Cholesterol 183 <200 mg/dL   HDL 43 > OR = 40 mg/dL   Triglycerides 160 (H) <150 mg/dL   LDL Cholesterol (Calc)  112 (H) mg/dL  (calc)   Total CHOL/HDL Ratio 4.3 <5.0 (calc)   Non-HDL Cholesterol (Calc) 140 (H) <130 mg/dL (calc)  COMPLETE METABOLIC PANEL WITH GFR  Result Value Ref Range   Glucose, Bld 155 (H) 65 - 99 mg/dL   BUN 12 7 - 25 mg/dL   Creat 0.85 0.70 - 1.25 mg/dL   GFR, Est Non African American 90 > OR = 60 mL/min/1.26m2   GFR, Est African American 104 > OR = 60 mL/min/1.5m2   BUN/Creatinine Ratio NOT APPLICABLE 6 - 22 (calc)   Sodium 139 135 - 146 mmol/L   Potassium 4.2 3.5 - 5.3 mmol/L   Chloride 103 98 - 110 mmol/L   CO2 26 20 - 32 mmol/L   Calcium 9.5 8.6 - 10.3 mg/dL   Total Protein 6.8 6.1 - 8.1 g/dL   Albumin 4.1 3.6 - 5.1 g/dL   Globulin 2.7 1.9 - 3.7 g/dL (calc)   AG Ratio 1.5 1.0 - 2.5 (calc)   Total Bilirubin 0.5 0.2 - 1.2 mg/dL   Alkaline phosphatase (APISO) 62 35 - 144 U/L   AST 16 10 - 35 U/L   ALT 28 9 - 46 U/L  CBC with Differential/Platelet  Result Value Ref Range   WBC 5.9 3.8 - 10.8 Thousand/uL   RBC 4.15 (L) 4.20 - 5.80 Million/uL   Hemoglobin 13.5 13.2 - 17.1 g/dL   HCT 38.8 38.5 - 50.0 %   MCV 93.5 80.0 - 100.0 fL   MCH 32.5 27.0 - 33.0 pg   MCHC 34.8 32.0 - 36.0 g/dL   RDW 11.8 11.0 - 15.0 %   Platelets 307 140 - 400 Thousand/uL   MPV 10.3 7.5 - 12.5 fL   Neutro Abs 3,947 1,500 - 7,800 cells/uL   Lymphs Abs 1,333 850 - 3,900 cells/uL   Absolute Monocytes 472 200 - 950 cells/uL   Eosinophils Absolute 89 15 - 500 cells/uL   Basophils Absolute 59 0 - 200 cells/uL   Neutrophils Relative % 66.9 %   Total Lymphocyte 22.6 %   Monocytes Relative 8.0 %   Eosinophils Relative 1.5 %   Basophils Relative 1.0 %  Hemoglobin A1c  Result Value Ref Range   Hgb A1c MFr Bld 7.3 (H) <5.7 % of total Hgb   Mean Plasma Glucose 163 mg/dL   eAG (mmol/L) 9.0 mmol/L      Assessment & Plan:   Problem List Items Addressed This Visit    Type 2 diabetes mellitus with other specified complication (HCC)    G8Q stable at 7.3 Improved on GLP1 Complications - hyperglycemia,  hyperlipidemia - increases risk of future cardiovascular complications  - Failed Jardiance d/t yeast infection, off in 2020  Plan:  1. Continue Ozempic 1mg  weekly - in future can increase dosage when available if needed 2.  Reduce Metformin from 500 BID down to 500mg  PM w/ meal, goal to come off 3. Encourage improved lifestyle - low carb, low sugar diet, reduce portion size, continue improving regular exercise 4. Check CBG , bring log to next visit for review 5. Continue ASA, ACEi - previously on Statin - but now will restart trial on other statin Pravastatin lower dose.      Relevant Medications   metFORMIN (GLUCOPHAGE) 500 MG tablet   pravastatin (PRAVACHOL) 10 MG tablet   Hyperlipidemia associated with type 2 diabetes mellitus (HCC)    Mild elevated LDL off statin Last lipid panel 10/2020 The 10-year ASCVD risk score Mikey Bussing DC Brooke Bonito., et  al., 2013) is: 28.5%  Plan: 1. New rx Pravastatin 10mg  nightly - counseling on benefit risk side effect, dose adjustment, prior statin myalgia 2. Encourage improved lifestyle - low carb/cholesterol, reduce portion size, continue improving regular exercise  Follow-up 6 month fasting lab CMET Lipid f/u       Relevant Medications   metFORMIN (GLUCOPHAGE) 500 MG tablet   pravastatin (PRAVACHOL) 10 MG tablet   Essential hypertension    Controlled HTN - Home BP readings reviewed, normal  No known complications  Passed DOT physical    Plan:  1. Continue current BP regimen -  Lisinopril-HCTZ 10-12.5mg  (HALF pill) daily - has refill 2. Encourage improved lifestyle - low sodium diet, regular exercise 3. Continue monitor BP outside office, bring readings to next visit, if persistently >140/90 or new symptoms notify office sooner      Relevant Medications   pravastatin (PRAVACHOL) 10 MG tablet   Drug-induced myopathy    Will trial other statin Dosing instructions given       Other Visit Diagnoses    Annual physical exam    -  Primary       Updated Health Maintenance information UTD Colon CA Screen next cologuard or screening 2024 Reviewed recent lab results with patient Encouraged improvement to lifestyle with diet and exercise Goal of weight loss    Meds ordered this encounter  Medications  . pravastatin (PRAVACHOL) 10 MG tablet    Sig: Take 1 tablet (10 mg total) by mouth daily.    Dispense:  90 tablet    Refill:  3      Follow up plan: Return in about 6 months (around 04/30/2021) for 6 month fasting lab only then 1 week later Follow-up Cholesterol / DM.   Future lab A1c, Lipid, CMET 04/26/21  Nobie Putnam, [facility] 10/28/2020, 3:02 PM

## 2020-10-28 NOTE — Assessment & Plan Note (Addendum)
Mild elevated LDL off statin Last lipid panel 10/2020 The 10-year ASCVD risk score Mikey Bussing DC Jr., et al., 2013) is: 28.5%  Plan: 1. New rx Pravastatin 10mg  nightly - counseling on benefit risk side effect, dose adjustment, prior statin myalgia 2. Encourage improved lifestyle - low carb/cholesterol, reduce portion size, continue improving regular exercise  Follow-up 6 month fasting lab CMET Lipid f/u

## 2020-10-28 NOTE — Patient Instructions (Addendum)
Thank you for coming to the office today.  REDUCE Metformin now to 500mg  ONLY IN EVENING WITH DINNER. Can skip morning dose  Keep on Ozemipc  Recent Labs    11/11/19 0919 05/11/20 0820 10/26/20 0822  HGBA1C 6.9* 7.3* 7.3*   Keep up the great work! -------------------  You are at increased risk of future Cardiovascular complications such as Heart Attack or Stroke from an artery blockage due to abnormal cholesterol and/or risk factors. - As discussed, Statin Cholesterol pills both can both LOWER cholesterol and REDUCE this future risk of heart attack and stroke - Start Pravastatin 10mg  pill once at bedtime every night  This is lower dose than previous med.  If you develop mild aches or pains in muscle or joint that does NOT improve or go away after first 3-4 weeks then this may require [place] to adjust the dose. First I would recommend STOPPING the medication for a few weeks until your ache and pain symptoms completely RESOLVE. Then you can restart at a LOWER DOSE either HALF a pill at bedtime every night or LESS OFTEN such as one pill a week only and then gradually increase to every other day or max dose of 3 times a week  Lastly, sometimes we need to try other versions of this medicine to find one that works for you and does not cause side effects.  Recommend a probiotic for improving gas production to help.  DUE for FASTING BLOOD WORK (no food or drink after midnight before the lab appointment, only water or coffee without cream/sugar on the morning of)  SCHEDULE "Lab Only" visit in the morning at the clinic for lab draw in 6 MONTHS   - Make sure Lab Only appointment is at about 1 week before your next appointment, so that results will be available  For Lab Results, once available within 2-3 days of blood draw, you can can log in to MyChart online to view your results and a brief explanation. Also, we can discuss results at next follow-up visit.    Please schedule a Follow-up  Appointment to: Return in about 6 months (around 04/30/2021) for 6 month fasting lab only then 1 week later Follow-up Cholesterol / DM.  If you have any other questions or concerns, please feel free to call the office or send a message through [place]. You may also schedule an earlier appointment if necessary.  Additionally, you may be receiving a survey about your experience at our office within a few days to 1 week by e-mail or mail. We value your feedback.  Nobie Putnam, DO [place]

## 2020-10-29 ENCOUNTER — Telehealth: Payer: Self-pay

## 2020-10-29 NOTE — Telephone Encounter (Signed)
The pt was notified that he is A positive from his lab that was drawn 4 yrs ago.

## 2020-10-29 NOTE — Telephone Encounter (Signed)
Copied from [place] [phone]. Topic: General - Other >> Oct 29, 2020  9:59 AM Yvette Rack wrote: Reason for CRM: Pt stated he is completing an application and it asks his blood type. Pt would like to know if there is a record of his blood type. Cb# [phone]

## 2020-11-02 ENCOUNTER — Other Ambulatory Visit: Payer: Medicare Other

## 2020-11-09 ENCOUNTER — Encounter: Payer: Medicare Other | Admitting: Family Medicine

## 2020-11-27 DIAGNOSIS — M545 Low back pain, unspecified: Secondary | ICD-10-CM | POA: Diagnosis not present

## 2020-12-15 HISTORY — PX: TOOTH EXTRACTION: SUR596

## 2020-12-22 ENCOUNTER — Ambulatory Visit (INDEPENDENT_AMBULATORY_CARE_PROVIDER_SITE_OTHER): Payer: Medicare HMO

## 2020-12-22 VITALS — Ht 68.0 in | Wt 215.0 lb

## 2020-12-22 DIAGNOSIS — Z0000 Encounter for general adult medical examination without abnormal findings: Secondary | ICD-10-CM

## 2020-12-22 NOTE — Progress Notes (Signed)
I connected with Duane Grant today by telephone and verified that I am speaking with the correct person using two identifiers. Location patient: home Location provider: work Persons participating in the virtual visit: Duane Grant, Duane Durand LPN.   I discussed the limitations, risks, security and privacy concerns of performing an evaluation and management service by telephone and the availability of in person appointments. I also discussed with the patient that there may be a patient responsible charge related to this service. The patient expressed understanding and verbally consented to this telephonic visit.    Interactive audio and video telecommunications were attempted between this provider and patient, however failed, due to patient having technical difficulties OR patient did not have access to video capability.  We continued and completed visit with audio only.     Vital signs may be patient reported or missing.  Subjective:   Duane Grant is a 65 y.o. male who presents for Medicare Annual/Subsequent preventive examination.  Review of Systems     Cardiac Risk Factors include: advanced age (>79men, >42 women);diabetes mellitus;dyslipidemia;hypertension;male gender;obesity (BMI >30kg/m2)     Objective:    Today's Vitals   12/22/20 0812  Weight: 215 lb (97.5 kg)  Height: 5\' 8"  (1.727 m)   Body mass index is 32.69 kg/m.  Advanced Directives 12/22/2020 05/23/2020 12/17/2019 08/25/2016 06/12/2016 06/09/2016 06/09/2016  Does Patient Have a Medical Advance Directive? No No No No No No No  Would patient like information on creating a medical advance directive? - - - - No - patient declined information No - patient declined information -    Current Medications (verified) Outpatient Encounter Medications as of 12/22/2020  Medication Sig  . lisinopril-hydrochlorothiazide (ZESTORETIC) 10-12.5 MG tablet Take 0.5 tablets by mouth daily.  . metFORMIN (GLUCOPHAGE) 500 MG tablet Take 1  tablet (500 mg total) by mouth daily with supper.  . Omega-3 Fatty Acids (FISH OIL) 1200 MG CAPS Take 1 capsule by mouth 2 (two) times daily.  Marland Kitchen OZEMPIC, 1 MG/DOSE, 4 MG/3ML SOPN Inject 1 mg into the skin once a week.  . pravastatin (PRAVACHOL) 10 MG tablet Take 1 tablet (10 mg total) by mouth daily.  . tamsulosin (FLOMAX) 0.4 MG CAPS capsule Take 1 capsule (0.4 mg total) by mouth daily after supper.  . methocarbamol (ROBAXIN) 750 MG tablet Take 750 mg by mouth 3 (three) times daily. (Patient not taking: Reported on 12/22/2020)  . valACYclovir (VALTREX) 1000 MG tablet Take 1 tablet (1,000 mg total) by mouth daily as needed (HSV flare). For up to 5-7 as needed for flare, can repeat. (Patient not taking: No sig reported)   No facility-administered encounter medications on file as of 12/22/2020.    Allergies (verified) Patient has no known allergies.   History: Past Medical History:  Diagnosis Date  . Arthritis   . Cancer [facility]) 2009   Prostate, radiation but no surgery  . Elevated PSA   . H/O removal of cyst 12/02/2019   back   . History of prostate cancer   . Hyperlipidemia   . Hypertension    Past Surgical History:  Procedure Laterality Date  . HERNIA REPAIR  [id number]   Umbilical  . HERNIA REPAIR  2010  . Removal Fatty Tumor Right 2015   [facility], right side of chin/jaw  . TOOTH EXTRACTION  12/15/2020  . TOTAL HIP ARTHROPLASTY Left 06/09/2016   Procedure: TOTAL HIP ARTHROPLASTY;  Surgeon: Thornton Park, MD;  Location: [facility] ORS;  Service: Orthopedics;  Laterality: Left;  Family History  Problem Relation Age of Onset  . Diabetes Mother   . Hypertension Mother    Social History   Socioeconomic History  . Marital status: Married    Spouse name: Not on file  . Number of children: Not on file  . Years of education: [employer]  . Highest education level: High school graduate  Occupational History  . Not on file  Tobacco Use  . Smoking status: Former Smoker     Packs/day: 0.50    Years: 35.00    Pack years: 17.50    Types: Cigarettes    Quit date: 11/14/1994    Years since quitting: 26.1  . Smokeless tobacco: Former Network engineer  . Vaping Use: Never used  Substance and Sexual Activity  . Alcohol use: Yes    Comment: occassional  . Drug use: No  . Sexual activity: Yes    Birth control/protection: None  Other Topics Concern  . Not on file  Social History Narrative  . Not on file   Social Determinants of Health   Financial Resource Strain: Low Risk   . Difficulty of Paying Living Expenses: Not hard at all  Food Insecurity: No Food Insecurity  . Worried About Charity fundraiser in the Last Year: Never true  . Ran Out of Food in the Last Year: Never true  Transportation Needs: No Transportation Needs  . Lack of Transportation (Medical): No  . Lack of Transportation (Non-Medical): No  Physical Activity: Insufficiently Active  . Days of Exercise per Week: 3 days  . Minutes of Exercise per Session: 40 min  Stress: No Stress Concern Present  . Feeling of Stress : Not at all  Social Connections: Not on file    Tobacco Counseling Counseling given: Not Answered   Clinical Intake:  Pre-visit preparation completed: Yes  Pain : No/denies pain     Nutritional Status: BMI > 30  Obese Nutritional Risks: None Diabetes: Yes  How often do you need to have someone help you when you read instructions, pamphlets, or other written materials from your doctor or pharmacy?: 1 - Never What is the last grade level you completed in school?: 11th grade  Diabetic? Yes Nutrition Risk Assessment:  Has the patient had any N/V/D within the last 2 months?  No  Does the patient have any non-healing wounds?  No  Has the patient had any unintentional weight loss or weight gain?  No   Diabetes:  Is the patient diabetic?  Yes  If diabetic, was a CBG obtained today?  No  Did the patient bring in their glucometer from home?  No  How often do you  monitor your CBG's? Once month.   Financial Strains and Diabetes Management:  Are you having any financial strains with the device, your supplies or your medication? No .  Does the patient want to be seen by Chronic Care Management for management of their diabetes?  No  Would the patient like to be referred to a Nutritionist or for Diabetic Management?  No   Diabetic Exams:  Diabetic Eye Exam: Completed 08/24/2020 Diabetic Foot Exam: Completed 05/11/2020   Interpreter Needed?: No  Information entered by :: NAllen LPN   Activities of Daily Living In your present state of health, do you have any difficulty performing the following activities: 12/22/2020  Hearing? N  Comment ringing in ears at times  Vision? N  Difficulty concentrating or making decisions? N  Walking or climbing stairs? N  Dressing or bathing? N  Doing errands, shopping? N  Preparing Food and eating ? N  Using the Toilet? N  In the past six months, have you accidently leaked urine? N  Do you have problems with loss of bowel control? N  Managing your Medications? N  Managing your Finances? N  Housekeeping or managing your Housekeeping? N  Some recent data might be hidden    Patient Care Team: Olin Hauser, DO as PCP - General (Family Medicine)  Indicate any recent Medical Services you may have received from other than [facility] providers in the past year (date may be approximate).     Assessment:   This is a routine wellness examination for [facility].  Hearing/Vision screen  Hearing Screening   125Hz  250Hz  500Hz  1000Hz  2000Hz  3000Hz  4000Hz  6000Hz  8000Hz   Right ear:           Left ear:           Vision Screening Comments: Regular eye exams, [facility]  Dietary issues and exercise activities discussed: Current Exercise Habits: Home exercise routine, Type of exercise: Other - see comments (stationary bike), Time (Minutes): 45, Frequency (Times/Week): 3, Weekly Exercise (Minutes/Week): 135  Goals  Addressed            This Visit's Progress   . Patient Stated       12/22/2020, wants to weigh 190 pounds      Depression Screen PHQ 2/9 Scores 12/22/2020 05/11/2020 12/23/2019 12/17/2019 04/29/2019 03/11/2019  PHQ - 2 Score 0 0 0 0 0 0    Fall Risk Fall Risk  12/22/2020 05/11/2020 12/23/2019 12/17/2019 12/02/2019  Falls in the past year? 0 0 0 0 0  Number falls in past yr: - 0 0 0 -  Injury with Fall? - 0 0 0 -  Risk for fall due to : Medication side effect - - - -  Follow up Falls evaluation completed;Education provided;Falls prevention discussed Falls evaluation completed Falls evaluation completed - -    FALL RISK PREVENTION PERTAINING TO THE HOME:  Any stairs in or around the home? Yes  If so, are there any without handrails? No  Home free of loose throw rugs in walkways, pet beds, electrical cords, etc? Yes  Adequate lighting in your home to reduce risk of falls? Yes   ASSISTIVE DEVICES UTILIZED TO PREVENT FALLS:  Life alert? No  Use of a cane, walker or w/c? No  Grab bars in the bathroom? No  Shower chair or bench in shower? No  Elevated toilet seat or a handicapped toilet? No   TIMED UP AND GO:  Was the test performed? No .     Cognitive Function:     6CIT Screen 12/22/2020  What Year? 0 points  What month? 0 points  What time? 0 points  Count back from 20 0 points  Months in reverse 0 points  Repeat phrase 0 points  Total Score 0    Immunizations Immunization History  Administered Date(s) Administered  . Fluad Quad(high Dose 65+) 05/27/2020  . Influenza, High Dose Seasonal PF 10/07/2018, 07/24/2019  . Influenza, Quadrivalent, Recombinant, Inj, Pf 05/20/2019  . Influenza-Unspecified 05/20/2019  . PFIZER(Purple Top)SARS-COV-2 Vaccination 11/26/2019, 12/17/2019, 07/18/2020  . Pneumococcal Conjugate-13 05/20/2019  . Pneumococcal Polysaccharide-23 06/09/2020  . Tdap 05/16/2020  . Zoster Recombinat (Shingrix) 05/20/2019, 07/21/2019    TDAP status: Up to  date  Flu Vaccine status: Up to date  Pneumococcal vaccine status: Up to date  Covid-19 vaccine status: Completed  vaccines  Qualifies for Shingles Vaccine? Yes   Zostavax completed No   Shingrix Completed?: Yes  Screening Tests Health Maintenance  Topic Date Due  . COVID-19 Vaccine (4 - Booster for Pfizer series) 01/16/2021  . INFLUENZA VACCINE  03/15/2021  . HEMOGLOBIN A1C  04/28/2021  . FOOT EXAM  05/11/2021  . OPHTHALMOLOGY EXAM  08/24/2021  . Fecal DNA (Cologuard)  11/19/2022  . TETANUS/TDAP  05/16/2030  . Hepatitis C Screening  Completed  . PNA vac Low Risk Adult  Completed  . HPV VACCINES  Aged Out    Health Maintenance  There are no preventive care reminders to display for this patient.  Colorectal cancer screening: Type of screening: Cologuard. Completed 11/19/2019. Repeat every 3 years  Lung Cancer Screening: (Low Dose CT Chest recommended if Age 56-80 years, 30 pack-year currently smoking OR have quit w/in 15years.) does not qualify.   Lung Cancer Screening Referral: no  Additional Screening:  Hepatitis C Screening: does qualify; Completed 10/26/2020  Vision Screening: Recommended annual ophthalmology exams for early detection of glaucoma and other disorders of the eye. Is the patient up to date with their annual eye exam?  Yes  Who is the provider or what is the name of the office in which the patient attends annual eye exams? [facility] If pt is not established with a provider, would they like to be referred to a provider to establish care? No .   Dental Screening: Recommended annual dental exams for proper oral hygiene  Community Resource Referral / Chronic Care Management: CRR required this visit?  No   CCM required this visit?  No      Plan:     I have personally reviewed and noted the following in the patient's chart:   . Medical and social history . Use of alcohol, tobacco or illicit drugs  . Current medications and supplements including  opioid prescriptions. Patient is not currently taking opioid prescriptions. . Functional ability and status . Nutritional status . Physical activity . Advanced directives . List of other physicians . Hospitalizations, surgeries, and ER visits in previous 12 months . Vitals . Screenings to include cognitive, depression, and falls . Referrals and appointments  In addition, I have reviewed and discussed with patient certain preventive protocols, quality metrics, and best practice recommendations. A written personalized care plan for preventive services as well as general preventive health recommendations were provided to patient.     Kellie Simmering, LPN   [id number]/40/9811   Nurse Notes:

## 2020-12-22 NOTE — Patient Instructions (Signed)
Duane Grant , Thank you for taking time to come for your Medicare Wellness Visit. I appreciate your ongoing commitment to your health goals. Please review the following plan we discussed and let me know if I can assist you in the future.   Screening recommendations/referrals: Colonoscopy: cologuard 11/19/2019, due 11/19/2022 Recommended yearly ophthalmology/optometry visit for glaucoma screening and checkup Recommended yearly dental visit for hygiene and checkup  Vaccinations: Influenza vaccine: completed 05/27/2020, due 03/15/2021 Pneumococcal vaccine: completed 06/09/2020 Tdap vaccine: completed 05/16/2020, due 05/16/2030 Shingles vaccine: completed   Covid-19:  07/18/2020, 12/17/2019, 11/26/2019  Advanced directives: Advance directive discussed with you today.   Conditions/risks identified: none  Next appointment: Follow up in one year for your annual wellness visit.   Preventive Care 67 Years and Older, Male Preventive care refers to lifestyle choices and visits with your health care provider that can promote health and wellness. What does preventive care include?  A yearly physical exam. This is also called an annual well check.  Dental exams once or twice a year.  Routine eye exams. Ask your health care provider how often you should have your eyes checked.  Personal lifestyle choices, including:  Daily care of your teeth and gums.  Regular physical activity.  Eating a healthy diet.  Avoiding tobacco and drug use.  Limiting alcohol use.  Practicing safe sex.  Taking low doses of aspirin every day.  Taking vitamin and mineral supplements as recommended by your health care provider. What happens during an annual well check? The services and screenings done by your health care provider during your annual well check will depend on your age, overall health, lifestyle risk factors, and family history of disease. Counseling  Your health care provider may ask you questions about  your:  Alcohol use.  Tobacco use.  Drug use.  Emotional well-being.  Home and relationship well-being.  Sexual activity.  Eating habits.  History of falls.  Memory and ability to understand (cognition).  Work and work Statistician. Screening  You may have the following tests or measurements:  Height, weight, and BMI.  Blood pressure.  Lipid and cholesterol levels. These may be checked every 5 years, or more frequently if you are over 15 years old.  Skin check.  Lung cancer screening. You may have this screening every year starting at age 17 if you have a 30-pack-year history of smoking and currently smoke or have quit within the past 15 years.  Fecal occult blood test (FOBT) of the stool. You may have this test every year starting at age 92.  Flexible sigmoidoscopy or colonoscopy. You may have a sigmoidoscopy every 5 years or a colonoscopy every 10 years starting at age 74.  Prostate cancer screening. Recommendations will vary depending on your family history and other risks.  Hepatitis C blood test.  Hepatitis B blood test.  Sexually transmitted disease (STD) testing.  Diabetes screening. This is done by checking your blood sugar (glucose) after you have not eaten for a while (fasting). You may have this done every 1-3 years.  Abdominal aortic aneurysm (AAA) screening. You may need this if you are a current or former smoker.  Osteoporosis. You may be screened starting at age 23 if you are at high risk. Talk with your health care provider about your test results, treatment options, and if necessary, the need for more tests. Vaccines  Your health care provider may recommend certain vaccines, such as:  Influenza vaccine. This is recommended every year.  Tetanus, diphtheria, and acellular  pertussis (Tdap, Td) vaccine. You may need a Td booster every 10 years.  Zoster vaccine. You may need this after age 74.  Pneumococcal 13-valent conjugate (PCV13) vaccine.  One dose is recommended after age 38.  Pneumococcal polysaccharide (PPSV23) vaccine. One dose is recommended after age 58. Talk to your health care provider about which screenings and vaccines you need and how often you need them. This information is not intended to replace advice given to you by your health care provider. Make sure you discuss any questions you have with your health care provider. Document Released: 08/28/2015 Document Revised: 04/20/2016 Document Reviewed: 06/02/2015 Elsevier Interactive Patient Education  2017 [place] Prevention in the Home Falls can cause injuries. They can happen to people of all ages. There are many things you can do to make your home safe and to help prevent falls. What can I do on the outside of my home?  Regularly fix the edges of walkways and driveways and fix any cracks.  Remove anything that might make you trip as you walk through a door, such as a raised step or threshold.  Trim any bushes or trees on the path to your home.  Use bright outdoor lighting.  Clear any walking paths of anything that might make someone trip, such as rocks or tools.  Regularly check to see if handrails are loose or broken. Make sure that both sides of any steps have handrails.  Any raised decks and porches should have guardrails on the edges.  Have any leaves, snow, or ice cleared regularly.  Use sand or salt on walking paths during winter.  Clean up any spills in your garage right away. This includes oil or grease spills. What can I do in the bathroom?  Use night lights.  Install grab bars by the toilet and in the tub and shower. Do not use towel bars as grab bars.  Use non-skid mats or decals in the tub or shower.  If you need to sit down in the shower, use a plastic, non-slip stool.  Keep the floor dry. Clean up any water that spills on the floor as soon as it happens.  Remove soap buildup in the tub or shower regularly.  Attach bath  mats securely with double-sided non-slip rug tape.  Do not have throw rugs and other things on the floor that can make you trip. What can I do in the bedroom?  Use night lights.  Make sure that you have a light by your bed that is easy to reach.  Do not use any sheets or blankets that are too big for your bed. They should not hang down onto the floor.  Have a firm chair that has side arms. You can use this for support while you get dressed.  Do not have throw rugs and other things on the floor that can make you trip. What can I do in the kitchen?  Clean up any spills right away.  Avoid walking on wet floors.  Keep items that you use a lot in easy-to-reach places.  If you need to reach something above you, use a strong step stool that has a grab bar.  Keep electrical cords out of the way.  Do not use floor polish or wax that makes floors slippery. If you must use wax, use non-skid floor wax.  Do not have throw rugs and other things on the floor that can make you trip. What can I do with my stairs?  Do not leave any items on the stairs.  Make sure that there are handrails on both sides of the stairs and use them. Fix handrails that are broken or loose. Make sure that handrails are as long as the stairways.  Check any carpeting to make sure that it is firmly attached to the stairs. Fix any carpet that is loose or worn.  Avoid having throw rugs at the top or bottom of the stairs. If you do have throw rugs, attach them to the floor with carpet tape.  Make sure that you have a light switch at the top of the stairs and the bottom of the stairs. If you do not have them, ask someone to add them for you. What else can I do to help prevent falls?  Wear shoes that:  Do not have high heels.  Have rubber bottoms.  Are comfortable and fit you well.  Are closed at the toe. Do not wear sandals.  If you use a stepladder:  Make sure that it is fully opened. Do not climb a closed  stepladder.  Make sure that both sides of the stepladder are locked into place.  Ask someone to hold it for you, if possible.  Clearly mark and make sure that you can see:  Any grab bars or handrails.  First and last steps.  Where the edge of each step is.  Use tools that help you move around (mobility aids) if they are needed. These include:  Canes.  Walkers.  Scooters.  Crutches.  Turn on the lights when you go into a dark area. Replace any light bulbs as soon as they burn out.  Set up your furniture so you have a clear path. Avoid moving your furniture around.  If any of your floors are uneven, fix them.  If there are any pets around you, be aware of where they are.  Review your medicines with your doctor. Some medicines can make you feel dizzy. This can increase your chance of falling. Ask your doctor what other things that you can do to help prevent falls. This information is not intended to replace advice given to you by your health care provider. Make sure you discuss any questions you have with your health care provider. Document Released: 05/28/2009 Document Revised: 01/07/2016 Document Reviewed: 09/05/2014 Elsevier Interactive Patient Education  2017 [employer].

## 2021-01-08 DIAGNOSIS — M545 Low back pain, unspecified: Secondary | ICD-10-CM | POA: Diagnosis not present

## 2021-03-08 DIAGNOSIS — Z79899 Other long term (current) drug therapy: Secondary | ICD-10-CM | POA: Diagnosis not present

## 2021-03-08 DIAGNOSIS — Z87891 Personal history of nicotine dependence: Secondary | ICD-10-CM | POA: Diagnosis not present

## 2021-03-08 DIAGNOSIS — E119 Type 2 diabetes mellitus without complications: Secondary | ICD-10-CM | POA: Diagnosis not present

## 2021-03-08 DIAGNOSIS — Z79818 Long term (current) use of other agents affecting estrogen receptors and estrogen levels: Secondary | ICD-10-CM | POA: Diagnosis not present

## 2021-03-08 DIAGNOSIS — C61 Malignant neoplasm of prostate: Secondary | ICD-10-CM | POA: Diagnosis not present

## 2021-03-08 DIAGNOSIS — Z923 Personal history of irradiation: Secondary | ICD-10-CM | POA: Diagnosis not present

## 2021-03-08 DIAGNOSIS — R232 Flushing: Secondary | ICD-10-CM | POA: Diagnosis not present

## 2021-03-08 DIAGNOSIS — Z7984 Long term (current) use of oral hypoglycemic drugs: Secondary | ICD-10-CM | POA: Diagnosis not present

## 2021-04-02 ENCOUNTER — Other Ambulatory Visit: Payer: Self-pay | Admitting: Family Medicine

## 2021-04-02 DIAGNOSIS — I10 Essential (primary) hypertension: Secondary | ICD-10-CM

## 2021-04-22 ENCOUNTER — Other Ambulatory Visit: Payer: Self-pay | Admitting: Family Medicine

## 2021-04-22 DIAGNOSIS — E1169 Type 2 diabetes mellitus with other specified complication: Secondary | ICD-10-CM

## 2021-04-22 NOTE — Telephone Encounter (Signed)
Requested medications are due for refill today yes  Requested medications are on the active medication list yes  Last refill 01/22/21  Last visit 10/2020  Future visit scheduled 05/03/21  Notes to clinic Dose inconsistent with current med list, please assess.

## 2021-04-23 ENCOUNTER — Other Ambulatory Visit: Payer: Self-pay

## 2021-04-23 DIAGNOSIS — E1169 Type 2 diabetes mellitus with other specified complication: Secondary | ICD-10-CM

## 2021-04-23 MED ORDER — METFORMIN HCL 500 MG PO TABS: 500.0000 mg | ORAL_TABLET | Freq: Every day | ORAL | 3 refills | Status: DC

## 2021-04-26 ENCOUNTER — Other Ambulatory Visit: Payer: Medicare HMO

## 2021-04-26 DIAGNOSIS — E1169 Type 2 diabetes mellitus with other specified complication: Secondary | ICD-10-CM | POA: Diagnosis not present

## 2021-04-26 DIAGNOSIS — E785 Hyperlipidemia, unspecified: Secondary | ICD-10-CM | POA: Diagnosis not present

## 2021-04-27 ENCOUNTER — Other Ambulatory Visit: Payer: Self-pay

## 2021-04-27 LAB — COMPLETE METABOLIC PANEL WITH GFR
AG Ratio: 1.7 (calc) (ref 1.0–2.5)
ALT: 19 U/L (ref 9–46)
AST: 15 U/L (ref 10–35)
Albumin: 4.1 g/dL (ref 3.6–5.1)
Alkaline phosphatase (APISO): 57 U/L (ref 35–144)
BUN: 13 mg/dL (ref 7–25)
CO2: 25 mmol/L (ref 20–32)
Calcium: 9.6 mg/dL (ref 8.6–10.3)
Chloride: 105 mmol/L (ref 98–110)
Creat: 0.86 mg/dL (ref 0.70–1.35)
Globulin: 2.4 g/dL (calc) (ref 1.9–3.7)
Glucose, Bld: 154 mg/dL — ABNORMAL HIGH (ref 65–99)
Potassium: 4.2 mmol/L (ref 3.5–5.3)
Sodium: 138 mmol/L (ref 135–146)
Total Bilirubin: 0.7 mg/dL (ref 0.2–1.2)
Total Protein: 6.5 g/dL (ref 6.1–8.1)
eGFR: 95 mL/min/{1.73_m2} (ref >=60)

## 2021-04-27 LAB — LIPID PANEL
Cholesterol: 150 mg/dL (ref <200)
HDL: 46 mg/dL (ref >=40)
LDL Cholesterol (Calc): 82 mg/dL (calc)
Non-HDL Cholesterol (Calc): 104 mg/dL (calc) (ref <130)
Total CHOL/HDL Ratio: 3.3 (calc) (ref <5.0)
Triglycerides: 127 mg/dL (ref <150)

## 2021-04-27 LAB — HEMOGLOBIN A1C
Hgb A1c MFr Bld: 7.2 % of total Hgb — ABNORMAL HIGH (ref <5.7)
Mean Plasma Glucose: 160 mg/dL
eAG (mmol/L): 8.9 mmol/L

## 2021-05-03 ENCOUNTER — Other Ambulatory Visit: Payer: Self-pay

## 2021-05-03 ENCOUNTER — Ambulatory Visit (INDEPENDENT_AMBULATORY_CARE_PROVIDER_SITE_OTHER): Payer: Medicare HMO | Admitting: Family Medicine

## 2021-05-03 ENCOUNTER — Encounter: Payer: Self-pay | Admitting: Family Medicine

## 2021-05-03 VITALS — BP 113/70 | HR 77 | Ht 68.0 in | Wt 216.4 lb

## 2021-05-03 DIAGNOSIS — G8929 Other chronic pain: Secondary | ICD-10-CM

## 2021-05-03 DIAGNOSIS — N401 Enlarged prostate with lower urinary tract symptoms: Secondary | ICD-10-CM

## 2021-05-03 DIAGNOSIS — C61 Malignant neoplasm of prostate: Secondary | ICD-10-CM | POA: Diagnosis not present

## 2021-05-03 DIAGNOSIS — Z23 Encounter for immunization: Secondary | ICD-10-CM | POA: Diagnosis not present

## 2021-05-03 DIAGNOSIS — E785 Hyperlipidemia, unspecified: Secondary | ICD-10-CM | POA: Diagnosis not present

## 2021-05-03 DIAGNOSIS — M545 Low back pain, unspecified: Secondary | ICD-10-CM | POA: Diagnosis not present

## 2021-05-03 DIAGNOSIS — I10 Essential (primary) hypertension: Secondary | ICD-10-CM | POA: Diagnosis not present

## 2021-05-03 DIAGNOSIS — E1169 Type 2 diabetes mellitus with other specified complication: Secondary | ICD-10-CM | POA: Diagnosis not present

## 2021-05-03 MED ORDER — LISINOPRIL-HYDROCHLOROTHIAZIDE 10-12.5 MG PO TABS: 0.5000 | ORAL_TABLET | Freq: Every day | ORAL | 3 refills | Status: DC

## 2021-05-03 MED ORDER — METHOCARBAMOL 750 MG PO TABS: 750.0000 mg | ORAL_TABLET | Freq: Three times a day (TID) | ORAL | 3 refills | Status: DC

## 2021-05-03 MED ORDER — TAMSULOSIN HCL 0.4 MG PO CAPS: 0.4000 mg | ORAL_CAPSULE | Freq: Every day | ORAL | 3 refills | Status: DC

## 2021-05-03 MED ORDER — OZEMPIC (1 MG/DOSE) 4 MG/3ML ~~LOC~~ SOPN: 1.0000 mg | PEN_INJECTOR | SUBCUTANEOUS | 3 refills | Status: DC

## 2021-05-03 NOTE — Assessment & Plan Note (Signed)
Followed by [facility] Managed for prostate cancer, prior treatment/radiation Improved on last follow-up

## 2021-05-03 NOTE — Assessment & Plan Note (Signed)
A1c stable at 7.2 Improved on GLP1 but some side effects Complications - hyperglycemia, hyperlipidemia - increases risk of future cardiovascular complications  - Failed Jardiance d/t yeast infection, off in 2020  Plan:  1. Continue Ozempic '1mg'$  weekly - may pause occasionally if side effects. 2. If on ozempic can keep Metformin IR 500 1-2 times daily if off Ozempic can take '1000mg'$  BID 3. Encourage improved lifestyle - low carb, low sugar diet, reduce portion size, continue improving regular exercise 4. Check CBG , bring log to next visit for review 5. Continue ASA, ACEi, statin 6. DM Foot exam

## 2021-05-03 NOTE — Assessment & Plan Note (Signed)
Controlled on statin Tolerating pravastatin, prior drug induced myopathy The 10-year ASCVD risk score (Arnett DK, et al., 2019) is: 23.4%  Plan: 1. Continue rx Pravastatin 10mg  nightly 2. Encourage improved lifestyle - low carb/cholesterol, reduce portion size, continue improving regular exercise

## 2021-05-03 NOTE — Patient Instructions (Addendum)
   Please schedule a Follow-up Appointment to: Return in about 6 months (around 10/31/2021) for 6 month Annual Physical fasting lab AFTER.  If you have any other questions or concerns, please feel free to call the office or send a message through [place]. You may also schedule an earlier appointment if necessary.  Additionally, you may be receiving a survey about your experience at our office within a few days to 1 week by e-mail or mail. We value your feedback.  Nobie Putnam, DO [place]

## 2021-05-03 NOTE — Progress Notes (Signed)
Subjective:    Patient ID: Duane Grant, male    DOB: 06-Nov-1955, 65 y.o.   MRN: [id number]  Duane Grant is a 65 y.o. male presenting on 05/03/2021 for Diabetes and Hyperlipidemia   HPI  CHRONIC DM, Type 2: Hyperlipidemia  He reports was he was on Ozempic 40m and recently ran out and felt a little better with more energy, he gave [employer] work after 1 week off medication. He increased metformin from 500 BID back up to 10076m - Improved cholesterol on last lab now on Pravastatin A1c previously 7-9 range. Has improved. Last result A1c 7.2 Meds: Metformin 50068mID with meals, Ozempic 1mg67mekly inj Reports good compliance. 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  Statin Myalgia Drug induced myopathy Hyperlipidemia Previously on Rosuvastatin 20mg60mad stopped this due to myalgia He is now on Pravastatin 10mg 37m improvement. Not having myalgia Labs show total cholesterol 183 to 150, and LDL 112 down to 82. TG 160 to 127.   CHRONIC HTN: Reports home BP readings 130 avg without concern, had DOT physical already in 2022 Current Meds - Lisinopril-HCTZ 10-12.5mg (H28m pill) daily  - has refills Reports good compliance, took meds today. Tolerating well, w/o complaints. Denies CP, dyspnea, HA, edema, dizziness / lightheadedness  Trigger Fingers Bilateral ring fingers, with catching triggering stiffness episodic.  Prostate Cancer Followed by by [facility] - Dr Daniel Lawanna KobusRadiation 2010, PSA was up to 19 in 08/2019, then treated with Trelstar (ADT) - last dose ADT 09/07/20 - he takes OTC Black Cohosh for hormonal symptoms. [facility] On Fl[facility]  Health Maintenance: Flu shot at pharmacy  Depression screen PHQ 2/9[facility]/05/2021 05/11/2020 12/23/2019  Decreased Interest 0 0 0  Down, Depressed, Hopeless 0 0 0  PHQ - 2 Score 0 0 0    Social History   Tobacco Use   Smoking status: Former    Packs/day: 0.50    Years: 35.00    Pack years: 17.50    Types: Cigarettes    Quit date: 11/14/1994    Years since quitting: 26.4   Smokeless tobacco: Former  Vaping Scientific laboratory technicianever used  Substance Use Topics   Alcohol use: Yes    Comment: occassional   Drug use: No    Review of Systems Per HPI unless specifically indicated above     Objective:    BP 113/70   Pulse 77   Ht _0  (1.727 m)   Wt 216 lb 6.4 oz (98.2 kg)   SpO2 98%   BMI 32.90 kg/m   Wt Readings from Last 3 Encounters:  05/03/21 216 lb 6.4 oz (98.2 kg)  12/22/20 215 lb (97.5 kg)  10/28/20 218 lb (98.9 kg)    Physical Exam Vitals and nursing note reviewed.  Constitutional:      General: He is not in acute distress.    Appearance: He is well-developed. He is not diaphoretic.     Comments: Well-appearing, comfortable, cooperative  HENT:     Head: Normocephalic and atraumatic.  Eyes:     General:        Right eye: No discharge.        Left eye: No discharge.     Conjunctiva/sclera: Conjunctivae  normal.  Neck:     Thyroid: No thyromegaly.  Cardiovascular:     Rate and Rhythm: Normal rate and regular rhythm.     Pulses: Normal pulses.     Heart sounds: Normal heart sounds. No murmur heard. Pulmonary:     Effort: Pulmonary effort is normal. No respiratory distress.     Breath sounds: Normal breath sounds. No wheezing or rales.  Musculoskeletal:        General: Normal range of motion.     Cervical back: Normal range of motion and neck supple.  Lymphadenopathy:     Cervical: No cervical adenopathy.  Skin:    General: Skin is warm and dry.     Findings: No erythema or rash.  Neurological:     Mental Status: He is alert and oriented to person, place, and time. Mental status is at baseline.  Psychiatric:         Behavior: Behavior normal.     Comments: Well groomed, good eye contact, normal speech and thoughts    Diabetic Foot Exam - Simple   Simple Foot Form Diabetic Foot exam was performed with the following findings: Yes 05/03/2021  8:35 AM  Visual Inspection No deformities, no ulcerations, no other skin breakdown bilaterally: Yes Sensation Testing Intact to touch and monofilament testing bilaterally: Yes Pulse Check Posterior Tibialis and Dorsalis pulse intact bilaterally: Yes Comments     Results for orders placed or performed in visit on 04/23/21  Lipid panel  Result Value Ref Range   Cholesterol 150 <200 mg/dL   HDL 46 > OR = 40 mg/dL   Triglycerides 127 <150 mg/dL   LDL Cholesterol (Calc) 82 mg/dL (calc)   Total CHOL/HDL Ratio 3.3 <5.0 (calc)   Non-HDL Cholesterol (Calc) 104 <130 mg/dL (calc)  COMPLETE METABOLIC PANEL WITH GFR  Result Value Ref Range   Glucose, Bld 154 (H) 65 - 99 mg/dL   BUN 13 7 - 25 mg/dL   Creat 0.86 0.70 - 1.35 mg/dL   eGFR 95 > OR = 60 mL/min/1.91m   BUN/Creatinine Ratio NOT APPLICABLE 6 - 22 (calc)   Sodium 138 135 - 146 mmol/L   Potassium 4.2 3.5 - 5.3 mmol/L   Chloride 105 98 - 110 mmol/L   CO2 25 20 - 32 mmol/L   Calcium 9.6 8.6 - 10.3 mg/dL   Total Protein 6.5 6.1 - 8.1 g/dL   Albumin 4.1 3.6 - 5.1 g/dL   Globulin 2.4 1.9 - 3.7 g/dL (calc)   AG Ratio 1.7 1.0 - 2.5 (calc)   Total Bilirubin 0.7 0.2 - 1.2 mg/dL   Alkaline phosphatase (APISO) 57 35 - 144 U/L   AST 15 10 - 35 U/L   ALT 19 9 - 46 U/L  Hemoglobin A1c  Result Value Ref Range   Hgb A1c MFr Bld 7.2 (H) <5.7 % of total Hgb   Mean Plasma Glucose 160 mg/dL   eAG (mmol/L) 8.9 mmol/L      Assessment & Plan:   Problem List Items Addressed This Visit     Type 2 diabetes mellitus with other specified complication (HCC) - Primary    A1c stable at 7.2 Improved on GLP1 but some side effects Complications - hyperglycemia, hyperlipidemia - increases risk of future cardiovascular  complications  - Failed Jardiance d/t yeast infection, off in 2020  Plan:  1. Continue Ozempic 147mweekly - may pause occasionally if side effects. 2. If on ozempic can keep Metformin IR 500 1-2 times daily if  off Ozempic can take 1074m BID 3. Encourage improved lifestyle - low carb, low sugar diet, reduce portion size, continue improving regular exercise 4. Check CBG , bring log to next visit for review 5. Continue ASA, ACEi, statin 6. DM Foot exam      Relevant Medications   lisinopril-hydrochlorothiazide (ZESTORETIC) 10-12.5 MG tablet   OZEMPIC, 1 MG/DOSE, 4 MG/3ML SOPN   Prostate cancer ([place]    [facility]ed by [facility] Managed for prostate cancer, prior treatment/radiation Improved on last follow-up      Hyperlipidemia associated with type 2 diabetes mellitus ([place]    Controlled on statin Tolerating pravastatin, prior drug induced myopathy The 10-year ASCVD risk score (Arnett DK, et al., 2019) is: 23.4%  Plan: 1. Continue rx Pravastatin 168mnightly 2. Encourage improved lifestyle - low carb/cholesterol, reduce portion size, continue improving regular exercise      Relevant Medications   lisinopril-hydrochlorothiazide (ZESTORETIC) 10-12.5 MG tablet   OZEMPIC, 1 MG/DOSE, 4 MG/3ML SOPN   Essential hypertension    Controlled HTN - Home BP readings reviewed, normal  No known complications  Passed DOT physical    Plan:  1. Continue current BP regimen -  Lisinopril-HCTZ 10-12.108m81mHALF pill) daily refill 2. Encourage improved lifestyle - low sodium diet, regular exercise 3. Continue monitor BP outside office, bring readings to next visit, if persistently >140/90 or new symptoms notify office sooner      Relevant Medications   lisinopril-hydrochlorothiazide (ZESTORETIC) 10-12.5 MG tablet   Chronic bilateral low back pain without sciatica   Relevant Medications   methocarbamol (ROBAXIN) 750 MG tablet   Other Visit Diagnoses     Benign prostatic  hyperplasia with lower urinary tract symptoms, symptom details unspecified       Relevant Medications   tamsulosin (FLOMAX) 0.4 MG CAPS capsule   Needs flu shot           Meds ordered this encounter  Medications   lisinopril-hydrochlorothiazide (ZESTORETIC) 10-12.5 MG tablet    Sig: Take 0.5 tablets by mouth daily.    Dispense:  45 tablet    Refill:  3   OZEMPIC, 1 MG/DOSE, 4 MG/3ML SOPN    Sig: Inject 1 mg into the skin once a week.    Dispense:  9 mL    Refill:  3   tamsulosin (FLOMAX) 0.4 MG CAPS capsule    Sig: Take 1 capsule (0.4 mg total) by mouth daily after supper.    Dispense:  90 capsule    Refill:  3   methocarbamol (ROBAXIN) 750 MG tablet    Sig: Take 1 tablet (750 mg total) by mouth 3 (three) times daily.    Dispense:  60 tablet    Refill:  3      Follow up plan: Return in about 6 months (around 10/31/2021) for 6 month Annual Physical fasting lab AFTER.  \ AleNobie PutnamO [facility] 05/03/2021, 8:31 AM

## 2021-05-03 NOTE — Assessment & Plan Note (Signed)
Controlled HTN - Home BP readings reviewed, normal  No known complications  Passed DOT physical    Plan:  1. Continue current BP regimen -  Lisinopril-HCTZ 10-12.'5mg'$  (HALF pill) daily refill 2. Encourage improved lifestyle - low sodium diet, regular exercise 3. Continue monitor BP outside office, bring readings to next visit, if persistently >140/90 or new symptoms notify office sooner

## 2021-05-15 DIAGNOSIS — E785 Hyperlipidemia, unspecified: Secondary | ICD-10-CM | POA: Diagnosis not present

## 2021-05-15 DIAGNOSIS — M545 Low back pain, unspecified: Secondary | ICD-10-CM | POA: Diagnosis not present

## 2021-05-15 DIAGNOSIS — N529 Male erectile dysfunction, unspecified: Secondary | ICD-10-CM | POA: Diagnosis not present

## 2021-05-15 DIAGNOSIS — I10 Essential (primary) hypertension: Secondary | ICD-10-CM | POA: Diagnosis not present

## 2021-05-15 DIAGNOSIS — N400 Enlarged prostate without lower urinary tract symptoms: Secondary | ICD-10-CM | POA: Diagnosis not present

## 2021-05-15 DIAGNOSIS — Z008 Encounter for other general examination: Secondary | ICD-10-CM | POA: Diagnosis not present

## 2021-05-15 DIAGNOSIS — E119 Type 2 diabetes mellitus without complications: Secondary | ICD-10-CM | POA: Diagnosis not present

## 2021-05-15 DIAGNOSIS — G8929 Other chronic pain: Secondary | ICD-10-CM | POA: Diagnosis not present

## 2021-05-15 DIAGNOSIS — M1990 Unspecified osteoarthritis, unspecified site: Secondary | ICD-10-CM | POA: Diagnosis not present

## 2021-05-15 DIAGNOSIS — R69 Illness, unspecified: Secondary | ICD-10-CM | POA: Diagnosis not present

## 2021-05-15 LAB — HM DIABETES EYE EXAM

## 2021-05-25 ENCOUNTER — Encounter: Payer: Self-pay | Admitting: Family Medicine

## 2021-06-07 DIAGNOSIS — Z96642 Presence of left artificial hip joint: Secondary | ICD-10-CM | POA: Diagnosis not present

## 2021-06-07 DIAGNOSIS — C61 Malignant neoplasm of prostate: Secondary | ICD-10-CM | POA: Diagnosis not present

## 2021-06-16 DIAGNOSIS — Z23 Encounter for immunization: Secondary | ICD-10-CM | POA: Diagnosis not present

## 2021-06-16 DIAGNOSIS — C61 Malignant neoplasm of prostate: Secondary | ICD-10-CM | POA: Diagnosis not present

## 2021-07-14 DIAGNOSIS — C61 Malignant neoplasm of prostate: Secondary | ICD-10-CM | POA: Diagnosis not present

## 2021-09-13 ENCOUNTER — Telehealth: Payer: Self-pay

## 2021-09-13 NOTE — Telephone Encounter (Signed)
Copied from [place] [id number]. Topic: General - Other >> Sep 13, 2021  1:59 PM Alanda Slim E wrote: Reason for CRM: Pt needs A1C  checked for his work health card by 3.7.23 / pt is asking if he can get orders for just this lab/ Pt is scheduled to have his CPE and labs later in March but just needs A1C before then/ please advise

## 2021-09-15 DIAGNOSIS — Z87891 Personal history of nicotine dependence: Secondary | ICD-10-CM | POA: Diagnosis not present

## 2021-09-15 DIAGNOSIS — Z79818 Long term (current) use of other agents affecting estrogen receptors and estrogen levels: Secondary | ICD-10-CM | POA: Diagnosis not present

## 2021-09-15 DIAGNOSIS — E1165 Type 2 diabetes mellitus with hyperglycemia: Secondary | ICD-10-CM | POA: Diagnosis not present

## 2021-09-15 DIAGNOSIS — Z79899 Other long term (current) drug therapy: Secondary | ICD-10-CM | POA: Diagnosis not present

## 2021-09-15 DIAGNOSIS — R232 Flushing: Secondary | ICD-10-CM | POA: Diagnosis not present

## 2021-09-15 DIAGNOSIS — C61 Malignant neoplasm of prostate: Secondary | ICD-10-CM | POA: Diagnosis not present

## 2021-11-08 ENCOUNTER — Other Ambulatory Visit: Payer: Medicare HMO

## 2021-11-08 ENCOUNTER — Other Ambulatory Visit: Payer: Self-pay

## 2021-11-08 DIAGNOSIS — Z125 Encounter for screening for malignant neoplasm of prostate: Secondary | ICD-10-CM

## 2021-11-08 DIAGNOSIS — E1169 Type 2 diabetes mellitus with other specified complication: Secondary | ICD-10-CM

## 2021-11-08 DIAGNOSIS — E669 Obesity, unspecified: Secondary | ICD-10-CM

## 2021-11-08 DIAGNOSIS — Z0000 Encounter for general adult medical examination without abnormal findings: Secondary | ICD-10-CM

## 2021-11-09 ENCOUNTER — Other Ambulatory Visit: Payer: Medicare HMO

## 2021-11-09 ENCOUNTER — Other Ambulatory Visit: Payer: Self-pay

## 2021-11-09 DIAGNOSIS — E1169 Type 2 diabetes mellitus with other specified complication: Secondary | ICD-10-CM | POA: Diagnosis not present

## 2021-11-09 DIAGNOSIS — Z0000 Encounter for general adult medical examination without abnormal findings: Secondary | ICD-10-CM | POA: Diagnosis not present

## 2021-11-09 DIAGNOSIS — Z125 Encounter for screening for malignant neoplasm of prostate: Secondary | ICD-10-CM | POA: Diagnosis not present

## 2021-11-09 DIAGNOSIS — E785 Hyperlipidemia, unspecified: Secondary | ICD-10-CM | POA: Diagnosis not present

## 2021-11-09 DIAGNOSIS — E669 Obesity, unspecified: Secondary | ICD-10-CM | POA: Diagnosis not present

## 2021-11-10 LAB — CBC WITH DIFFERENTIAL/PLATELET
Absolute Monocytes: 420 cells/uL (ref 200–950)
Basophils Absolute: 42 cells/uL (ref 0–200)
Basophils Relative: 0.7 %
Eosinophils Absolute: 90 cells/uL (ref 15–500)
Eosinophils Relative: 1.5 %
HCT: 39.4 % (ref 38.5–50.0)
Hemoglobin: 13.4 g/dL (ref 13.2–17.1)
Lymphs Abs: 972 cells/uL (ref 850–3900)
MCH: 32.8 pg (ref 27.0–33.0)
MCHC: 34 g/dL (ref 32.0–36.0)
MCV: 96.3 fL (ref 80.0–100.0)
MPV: 10.3 fL (ref 7.5–12.5)
Monocytes Relative: 7 %
Neutro Abs: 4476 cells/uL (ref 1500–7800)
Neutrophils Relative %: 74.6 %
Platelets: 258 10*3/uL (ref 140–400)
RBC: 4.09 10*6/uL — ABNORMAL LOW (ref 4.20–5.80)
RDW: 11.8 % (ref 11.0–15.0)
Total Lymphocyte: 16.2 %
WBC: 6 10*3/uL (ref 3.8–10.8)

## 2021-11-10 LAB — HEMOGLOBIN A1C
Hgb A1c MFr Bld: 7.1 % of total Hgb — ABNORMAL HIGH (ref <5.7)
Mean Plasma Glucose: 157 mg/dL
eAG (mmol/L): 8.7 mmol/L

## 2021-11-10 LAB — COMPREHENSIVE METABOLIC PANEL
AG Ratio: 1.5 (calc) (ref 1.0–2.5)
ALT: 15 U/L (ref 9–46)
AST: 15 U/L (ref 10–35)
Albumin: 4 g/dL (ref 3.6–5.1)
Alkaline phosphatase (APISO): 60 U/L (ref 35–144)
BUN: 12 mg/dL (ref 7–25)
CO2: 24 mmol/L (ref 20–32)
Calcium: 9.3 mg/dL (ref 8.6–10.3)
Chloride: 103 mmol/L (ref 98–110)
Creat: 0.91 mg/dL (ref 0.70–1.35)
Globulin: 2.6 g/dL (calc) (ref 1.9–3.7)
Glucose, Bld: 184 mg/dL — ABNORMAL HIGH (ref 65–99)
Potassium: 4.1 mmol/L (ref 3.5–5.3)
Sodium: 136 mmol/L (ref 135–146)
Total Bilirubin: 0.6 mg/dL (ref 0.2–1.2)
Total Protein: 6.6 g/dL (ref 6.1–8.1)

## 2021-11-10 LAB — LIPID PANEL
Cholesterol: 160 mg/dL (ref <200)
HDL: 48 mg/dL (ref >=40)
LDL Cholesterol (Calc): 84 mg/dL (calc)
Non-HDL Cholesterol (Calc): 112 mg/dL (calc) (ref <130)
Total CHOL/HDL Ratio: 3.3 (calc) (ref <5.0)
Triglycerides: 188 mg/dL — ABNORMAL HIGH (ref <150)

## 2021-11-10 LAB — PSA: PSA: 1.83 ng/mL (ref <=4.00)

## 2021-11-15 ENCOUNTER — Ambulatory Visit (INDEPENDENT_AMBULATORY_CARE_PROVIDER_SITE_OTHER): Payer: Medicare HMO | Admitting: Family Medicine

## 2021-11-15 VITALS — BP 126/82 | HR 75 | Ht 68.0 in | Wt 211.8 lb

## 2021-11-15 DIAGNOSIS — M79645 Pain in left finger(s): Secondary | ICD-10-CM

## 2021-11-15 DIAGNOSIS — M79641 Pain in right hand: Secondary | ICD-10-CM | POA: Diagnosis not present

## 2021-11-15 DIAGNOSIS — E669 Obesity, unspecified: Secondary | ICD-10-CM | POA: Diagnosis not present

## 2021-11-15 DIAGNOSIS — N401 Enlarged prostate with lower urinary tract symptoms: Secondary | ICD-10-CM | POA: Diagnosis not present

## 2021-11-15 DIAGNOSIS — M545 Low back pain, unspecified: Secondary | ICD-10-CM | POA: Diagnosis not present

## 2021-11-15 DIAGNOSIS — M65341 Trigger finger, right ring finger: Secondary | ICD-10-CM

## 2021-11-15 DIAGNOSIS — Z0000 Encounter for general adult medical examination without abnormal findings: Secondary | ICD-10-CM | POA: Diagnosis not present

## 2021-11-15 DIAGNOSIS — G8929 Other chronic pain: Secondary | ICD-10-CM

## 2021-11-15 DIAGNOSIS — E1169 Type 2 diabetes mellitus with other specified complication: Secondary | ICD-10-CM | POA: Diagnosis not present

## 2021-11-15 DIAGNOSIS — M65342 Trigger finger, left ring finger: Secondary | ICD-10-CM

## 2021-11-15 DIAGNOSIS — C61 Malignant neoplasm of prostate: Secondary | ICD-10-CM | POA: Diagnosis not present

## 2021-11-15 DIAGNOSIS — E785 Hyperlipidemia, unspecified: Secondary | ICD-10-CM

## 2021-11-15 DIAGNOSIS — M79642 Pain in left hand: Secondary | ICD-10-CM

## 2021-11-15 MED ORDER — NAPROXEN 500 MG PO TABS: 500.0000 mg | ORAL_TABLET | Freq: Two times a day (BID) | ORAL | 3 refills | Status: DC

## 2021-11-15 MED ORDER — PRAVASTATIN SODIUM 10 MG PO TABS: 10.0000 mg | ORAL_TABLET | Freq: Every day | ORAL | 3 refills | Status: DC

## 2021-11-15 MED ORDER — CYCLOBENZAPRINE HCL 10 MG PO TABS: 10.0000 mg | ORAL_TABLET | Freq: Two times a day (BID) | ORAL | 3 refills | Status: DC | PRN

## 2021-11-15 NOTE — Patient Instructions (Addendum)
Thank you for coming to the office today. ? ?Referral to Dr Verita Lamb for hands, trigger fingers, arthritis ? ?If you want to call [facility] for the other joints in the future that is fine - shoulders, hip, back - otherwise I can do new referrals if needed. ? ?PSA 1.83 on last lab test here (11/08/21) for your records and can forward to your Urologist at [facility]. Previous result was 1.5 ? ?Muscle Relaxant ?Stop Robaxin (Methocarbamol) ?START back on Flexeril (Cyclobenzaprine) '10mg'$  take as needed about every 8 hours if you need. Prefer night time dosing only due to drowsiness. ? ?Anti Inflammatory for back pain ?Prefer the new rx Naproxen ? ?Recommend trial of Anti-inflammatory with Naproxen '500mg'$  tabs - take one with food and plenty of water TWICE daily every day (breakfast and dinner), for next 1 to 2 weeks, then you may take only as needed ?- DO NOT TAKE any ibuprofen, aleve, motrin while you are taking this medicine ? ?- It is safe to take Tylenol Ext Str '500mg'$  tabs - take 1 to 2 (max dose '1000mg'$ ) every 6 hours as needed for breakthrough pain, max 24 hour daily dose is 6 to 8 tablets or '4000mg'$  ? ? ?Please schedule a Follow-up Appointment to: Return in about 6 months (around 05/17/2022) for 6 month follow-up DM A1c, Orthopedic updates. ? ?If you have any other questions or concerns, please feel free to call the office or send a message through [place]. You may also schedule an earlier appointment if necessary. ? ?Additionally, you may be receiving a survey about your experience at our office within a few days to 1 week by e-mail or mail. We value your feedback. ? ?Nobie Putnam, DO ?[place] ?

## 2021-11-15 NOTE — Assessment & Plan Note (Signed)
A1c stable at 7.1 ?Improved on GLP1 but some side effects ?Complications - hyperglycemia, hyperlipidemia - increases risk of future cardiovascular complications  ?- Failed Jardiance d/t yeast infection, off in 2020 ? ?Plan:  ?1. Continue Ozempic '1mg'$  weekly - may pause occasionally if side effects., Continue Metformin '500mg'$  nightly ?3. Encourage improved lifestyle - low carb, low sugar diet, reduce portion size, continue improving regular exercise ?4. Check CBG , bring log to next visit for review ?5. Continue ASA, ACEi, statin ?Urine microalbumin ? ?Letter for DOT A1c ? ?

## 2021-11-15 NOTE — Progress Notes (Signed)
? ?Subjective:  ? ? Patient ID: Duane Grant, male    DOB: 04-02-1956, 66 y.o.   MRN: [id number] ? ?Duane Grant is a 66 y.o. male presenting on 11/15/2021 for Annual Exam ? ? ?HPI ? ?Here for Annual Physical and Lab Review. ? ?CHRONIC DM, Type 2: ?Hyperlipidemia ?  ?Improved cholesterol on last lab now on Pravastatin ?A1c previously 7-9 range. Has improved. ?Last result A1c 7.2 ?Meds: Metformin '500mg'$  nightly with meals, Ozempic '1mg'$  weekly inj ?Reports good compliance. 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 ?  ?Statin Myalgia Drug induced myopathy ?Hyperlipidemia ?Previously on Rosuvastatin '20mg'$  - had stopped this due to myalgia ?He is now on Pravastatin '10mg'$  with improvement. Not having myalgia ?Lipid controlled ?  ?CHRONIC HTN: ?BP controlled ?Current Meds - Lisinopril-HCTZ 10-12.'5mg'$  (HALF pill) daily  - has refills ?Reports good compliance, took meds today. Tolerating well, w/o complaints. ?Denies CP, dyspnea, HA, edema, dizziness / lightheadedness ?  ?Left Shoulder Pain, limited ROM ?He is still driving regularly.  ? ?Trigger Fingers ?Bilateral ring fingers, with catching triggering stiffness episodic. ?Bilateral ring fingers ?  ?Prostate Cancer ?Followed by by [facility] - Dr Lawanna Kobus - S/p Radiation 2010, PSA was up to 19 in 08/2019, then treated with Trelstar (ADT) - last dose ADT 09/07/20 - he takes OTC Black Cohosh for hormonal symptoms. ?Still followed by [facility] ?On Flomax ?Lab showed PSA 1.8 prior 1.5 ?    ?Chronic Low Back Pain ?he got 2nd opinion with [facility], saw Carlynn Spry PA, has had Lumbar MRI ?- PT regimen in past. ?- On methocarbamol, has plenty of med, takes PRN ?  ?Statin Myalgia Drug induced myopathy ?Hyperlipidemia ?On Pravastatin  '10mg'$  ?  ?History of Recurrent Genital HSV ?No flare up recently. ?On Valtrex PRN only.  ?  ?Reduced Hearing, Left ear ?Reports chronic gradual decline in hearing, Left ear is worse. ?He would like a hearing test. ?  ?He has his DOT Physical Completed. ?  ?  ?Health Maintenance: ?UTD COVID19 vaccine 11/2019 and 12/2019 Pfizer completed ?  ?Shingrix vaccine updated. ?  ?Colon CA Screening: Never had colonoscopy. Cologuard negative 11/19/19 - next due 11/2022 (3 yr) ? ? ? ? ?  11/15/2021  ?  7:58 AM 12/22/2020  ?  8:19 AM 05/11/2020  ?  8:11 AM  ?Depression screen PHQ 2/9  ?Decreased Interest 2 0 0  ?Down, Depressed, Hopeless 0 0 0  ?PHQ - 2 Score 2 0 0  ?Altered sleeping 2    ?Tired, decreased energy 0    ?Change in appetite 0    ?Feeling bad or failure about yourself  0    ?Trouble concentrating 0    ?Moving slowly or fidgety/restless 0    ?Suicidal thoughts 0    ?PHQ-9 Score 4    ?Difficult doing work/chores Not difficult at all    ? ? ?Past Medical History:  ?Diagnosis Date  ? Arthritis   ? Cancer [facility]) 2009  ? Prostate, radiation but no surgery  ? Elevated PSA   ? H/O removal of cyst 12/02/2019  ? back   ? History of prostate cancer   ? Hyperlipidemia   ? Hypertension   ? ?Past Surgical History:  ?Procedure Laterality Date  ? HERNIA REPAIR  2010  ?  Umbilical  ? HERNIA REPAIR  2010  ? Removal Fatty Tumor Right 2015  ? [place], right side of chin/jaw  ? TOOTH EXTRACTION  12/15/2020  ? 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 Procedure: TOTAL HIP ARTHROPLASTY;  Surgeon: Thornton Park, MD;  Location: [facility] ORS;  Service: Orthopedics;  Laterality: Left;  ? ?Social History  ? ?Socioeconomic History  ? Marital status: Married  ?  Spouse name: Not on file  ? Number of children: Not on file  ? Years of education: High School  ? Highest education level: High school graduate  ?Occupational History  ? Not on file  ?Tobacco Use  ? Smoking status: Former  ?  Packs/day: 0.50  ?  Years: 35.00  ?  Pack years: 17.50  ?  Types:  Cigarettes  ?  Quit date: 11/14/1994  ?  Years since quitting: 27.0  ? Smokeless tobacco: Former  ?Vaping Use  ? Vaping Use: Never used  ?Substance and Sexual Activity  ? Alcohol use: Yes  ?  Comment: occassional  ? Drug use: No  ? Sexual activity: Yes  ?  Birth control/protection: None  ?Other Topics Concern  ? Not on file  ?Social History Narrative  ? Not on file  ? ?Social Determinants of Health  ? ?Financial Resource Strain: Low Risk   ? Difficulty of Paying Living Expenses: Not hard at all  ?Food Insecurity: No Food Insecurity  ? Worried About Charity fundraiser in the Last Year: Never true  ? Ran Out of Food in the Last Year: Never true  ?Transportation Needs: No Transportation Needs  ? Lack of Transportation (Medical): No  ? Lack of Transportation (Non-Medical): No  ?Physical Activity: Insufficiently Active  ? Days of Exercise per Week: 3 days  ? Minutes of Exercise per Session: 40 min  ?Stress: No Stress Concern Present  ? Feeling of Stress : Not at all  ?Social Connections: Not on file  ?Intimate Partner Violence: Not on file  ? ?Family History  ?Problem Relation Age of Onset  ? Diabetes Mother   ? Hypertension Mother   ? ?Current Outpatient Medications on File Prior to Visit  ?Medication Sig  ? lisinopril-hydrochlorothiazide (ZESTORETIC) 10-12.5 MG tablet Take 0.5 tablets by mouth daily.  ? metFORMIN (GLUCOPHAGE) 500 MG tablet Take 1 tablet (500 mg total) by mouth daily with supper.  ? Omega-3 Fatty Acids (FISH OIL) 1200 MG CAPS Take 1 capsule by mouth 2 (two) times daily.  ? OZEMPIC, 1 MG/DOSE, 4 MG/3ML SOPN Inject 1 mg into the skin once a week.  ? tamsulosin (FLOMAX) 0.4 MG CAPS capsule Take 1 capsule (0.4 mg total) by mouth daily after supper.  ? valACYclovir (VALTREX) 1000 MG tablet Take 1 tablet (1,000 mg total) by mouth daily as needed (HSV flare). For up to 5-7 as needed for flare, can repeat. (Patient not taking: Reported on 10/28/2020)  ? ?No current facility-administered medications on file prior  to visit.  ? ? ?Review of Systems ?Per HPI unless specifically indicated above ? ?   ?Objective:  ?  ?BP 126/82   Pulse 75   Ht '5\' 8"'$  (1.727 m)   Wt 211 lb 12.8 oz (96.1 kg)   SpO2 99%   BMI 32.20 kg/m?   ?Wt Readings from Last 3 Encounters:  ?11/15/21 211 lb 12.8 oz (96.1 kg)  ?05/03/21 216 lb 6.4 oz (98.2 kg)  ?12/22/20 215 lb (97.5 kg)  ?  ?Physical Exam ? ? ? ?Results  for orders placed or performed in visit on 11/08/21  ?CBC with Differential  ?Result Value Ref Range  ? WBC 6.0 3.8 - 10.8 Thousand/uL  ? RBC 4.09 (L) 4.20 - 5.80 Million/uL  ? Hemoglobin 13.4 13.2 - 17.1 g/dL  ? HCT 39.4 38.5 - 50.0 %  ? MCV 96.3 80.0 - 100.0 fL  ? MCH 32.8 27.0 - 33.0 pg  ? MCHC 34.0 32.0 - 36.0 g/dL  ? RDW 11.8 11.0 - 15.0 %  ? Platelets 258 140 - 400 Thousand/uL  ? MPV 10.3 7.5 - 12.5 fL  ? Neutro Abs 4,476 1,500 - 7,800 cells/uL  ? Lymphs Abs 972 850 - 3,900 cells/uL  ? Absolute Monocytes 420 200 - 950 cells/uL  ? Eosinophils Absolute 90 15 - 500 cells/uL  ? Basophils Absolute 42 0 - 200 cells/uL  ? Neutrophils Relative % 74.6 %  ? Total Lymphocyte 16.2 %  ? Monocytes Relative 7.0 %  ? Eosinophils Relative 1.5 %  ? Basophils Relative 0.7 %  ?PSA  ?Result Value Ref Range  ? PSA 1.83 < OR = 4.00 ng/mL  ?Lipid panel  ?Result Value Ref Range  ? Cholesterol 160 <200 mg/dL  ? HDL 48 > OR = 40 mg/dL  ? Triglycerides 188 (H) <150 mg/dL  ? LDL Cholesterol (Calc) 84 mg/dL (calc)  ? Total CHOL/HDL Ratio 3.3 <5.0 (calc)  ? Non-HDL Cholesterol (Calc) 112 <130 mg/dL (calc)  ?Comprehensive Metabolic Panel (CMET)  ?Result Value Ref Range  ? Glucose, Bld 184 (H) 65 - 99 mg/dL  ? BUN 12 7 - 25 mg/dL  ? Creat 0.91 0.70 - 1.35 mg/dL  ? BUN/Creatinine Ratio NOT APPLICABLE 6 - 22 (calc)  ? Sodium 136 135 - 146 mmol/L  ? Potassium 4.1 3.5 - 5.3 mmol/L  ? Chloride 103 98 - 110 mmol/L  ? CO2 24 20 - 32 mmol/L  ? Calcium 9.3 8.6 - 10.3 mg/dL  ? Total Protein 6.6 6.1 - 8.1 g/dL  ? Albumin 4.0 3.6 - 5.1 g/dL  ? Globulin 2.6 1.9 - 3.7 g/dL (calc)  ?  AG Ratio 1.5 1.0 - 2.5 (calc)  ? Total Bilirubin 0.6 0.2 - 1.2 mg/dL  ? Alkaline phosphatase (APISO) 60 35 - 144 U/L  ? AST 15 10 - 35 U/L  ? ALT 15 9 - 46 U/L  ?HgB A1c  ?Result Value Ref Range  ? Hgb A1c MFr

## 2021-11-15 NOTE — Assessment & Plan Note (Signed)
Controlled on statin ?Tolerating pravastatin, prior drug induced myopathy ?The 10-year ASCVD risk score (Arnett DK, et al., 2019) is: 29.1% ? ?Plan: ?1. Continue rx Pravastatin '10mg'$  nightly ?2. Encourage improved lifestyle - low carb/cholesterol, reduce portion size, continue improving regular exercise ?

## 2021-11-15 NOTE — Assessment & Plan Note (Signed)
Followed by [facility] ?Managed for prostate cancer, prior treatment/radiation ?Last PSA 1.8 range, prior 1.5 ?He will notify Urologist of lab result ?

## 2021-11-16 LAB — MICROALBUMIN / CREATININE URINE RATIO
Creatinine, Urine: 83 mg/dL (ref 20–320)
Microalb Creat Ratio: 2 mcg/mg creat (ref <30)
Microalb, Ur: 0.2 mg/dL

## 2021-11-29 DIAGNOSIS — M180 Bilateral primary osteoarthritis of first carpometacarpal joints: Secondary | ICD-10-CM | POA: Diagnosis not present

## 2021-11-29 DIAGNOSIS — M65341 Trigger finger, right ring finger: Secondary | ICD-10-CM | POA: Diagnosis not present

## 2021-12-10 DIAGNOSIS — M180 Bilateral primary osteoarthritis of first carpometacarpal joints: Secondary | ICD-10-CM | POA: Diagnosis not present

## 2021-12-10 DIAGNOSIS — M65342 Trigger finger, left ring finger: Secondary | ICD-10-CM | POA: Diagnosis not present

## 2021-12-10 DIAGNOSIS — M65341 Trigger finger, right ring finger: Secondary | ICD-10-CM | POA: Diagnosis not present

## 2021-12-13 LAB — HM DIABETES EYE EXAM

## 2021-12-15 DIAGNOSIS — E1165 Type 2 diabetes mellitus with hyperglycemia: Secondary | ICD-10-CM | POA: Diagnosis not present

## 2021-12-15 DIAGNOSIS — Z79818 Long term (current) use of other agents affecting estrogen receptors and estrogen levels: Secondary | ICD-10-CM | POA: Diagnosis not present

## 2021-12-15 DIAGNOSIS — C61 Malignant neoplasm of prostate: Secondary | ICD-10-CM | POA: Diagnosis not present

## 2021-12-15 DIAGNOSIS — Z79899 Other long term (current) drug therapy: Secondary | ICD-10-CM | POA: Diagnosis not present

## 2021-12-15 DIAGNOSIS — R232 Flushing: Secondary | ICD-10-CM | POA: Diagnosis not present

## 2021-12-28 ENCOUNTER — Ambulatory Visit: Payer: Medicare HMO

## 2021-12-30 ENCOUNTER — Ambulatory Visit (INDEPENDENT_AMBULATORY_CARE_PROVIDER_SITE_OTHER): Payer: BC Managed Care – PPO

## 2021-12-30 ENCOUNTER — Telehealth: Payer: Self-pay

## 2021-12-30 DIAGNOSIS — H9192 Unspecified hearing loss, left ear: Secondary | ICD-10-CM

## 2021-12-30 DIAGNOSIS — Z0000 Encounter for general adult medical examination without abnormal findings: Secondary | ICD-10-CM

## 2021-12-30 NOTE — Patient Instructions (Signed)
Health Maintenance, Male Adopting a healthy lifestyle and getting preventive care are important in promoting health and wellness. Ask your health care provider about: The right schedule for you to have regular tests and exams. Things you can do on your own to prevent diseases and keep yourself healthy. What should I know about diet, weight, and exercise? Eat a healthy diet  Eat a diet that includes plenty of vegetables, fruits, low-fat dairy products, and lean protein. Do not eat a lot of foods that are high in solid fats, added sugars, or sodium. Maintain a healthy weight Body mass index (BMI) is a measurement that can be used to identify possible weight problems. It estimates body fat based on height and weight. Your health care provider can help determine your BMI and help you achieve or maintain a healthy weight. Get regular exercise Get regular exercise. This is one of the most important things you can do for your health. Most adults should: Exercise for at least 150 minutes each week. The exercise should increase your heart rate and make you sweat (moderate-intensity exercise). Do strengthening exercises at least twice a week. This is in addition to the moderate-intensity exercise. Spend less time sitting. Even light physical activity can be beneficial. Watch cholesterol and blood lipids Have your blood tested for lipids and cholesterol at 68 years of age, then have this test every 5 years. You may need to have your cholesterol levels checked more often if: Your lipid or cholesterol levels are high. You are older than 68 years of age. You are at high risk for heart disease. What should I know about cancer screening? Many types of cancers can be detected early and may often be prevented. Depending on your health history and family history, you may need to have cancer screening at various ages. This may include screening for: Colorectal cancer. Prostate cancer. Skin cancer. Lung  cancer. What should I know about heart disease, diabetes, and high blood pressure? Blood pressure and heart disease High blood pressure causes heart disease and increases the risk of stroke. This is more likely to develop in people who have high blood pressure readings or are overweight. Talk with your health care provider about your target blood pressure readings. Have your blood pressure checked: Every 3-5 years if you are 18-39 years of age. Every year if you are 40 years old or older. If you are between the ages of 65 and 75 and are a current or former smoker, ask your health care provider if you should have a one-time screening for abdominal aortic aneurysm (AAA). Diabetes Have regular diabetes screenings. This checks your fasting blood sugar level. Have the screening done: Once every three years after age 45 if you are at a normal weight and have a low risk for diabetes. More often and at a younger age if you are overweight or have a high risk for diabetes. What should I know about preventing infection? Hepatitis B If you have a higher risk for hepatitis B, you should be screened for this virus. Talk with your health care provider to find out if you are at risk for hepatitis B infection. Hepatitis C Blood testing is recommended for: Everyone born from 1945 through 1965. Anyone with known risk factors for hepatitis C. Sexually transmitted infections (STIs) You should be screened each year for STIs, including gonorrhea and chlamydia, if: You are sexually active and are younger than 68 years of age. You are older than 68 years of age and your   health care provider tells you that you are at risk for this type of infection. Your sexual activity has changed since you were last screened, and you are at increased risk for chlamydia or gonorrhea. Ask your health care provider if you are at risk. Ask your health care provider about whether you are at high risk for HIV. Your health care provider  may recommend a prescription medicine to help prevent HIV infection. If you choose to take medicine to prevent HIV, you should first get tested for HIV. You should then be tested every 3 months for as long as you are taking the medicine. Follow these instructions at home: Alcohol use Do not drink alcohol if your health care provider tells you not to drink. If you drink alcohol: Limit how much you have to 0-2 drinks a day. Know how much alcohol is in your drink. In the [place] one drink equals one 12 oz bottle of beer (355 mL), one 5 oz glass of wine (148 mL), or one 1 oz glass of hard liquor (44 mL). Lifestyle Do not use any products that contain nicotine or tobacco. These products include cigarettes, chewing tobacco, and vaping devices, such as e-cigarettes. If you need help quitting, ask your health care provider. Do not use street drugs. Do not share needles. Ask your health care provider for help if you need support or information about quitting drugs. General instructions Schedule regular health, dental, and eye exams. Stay current with your vaccines. Tell your health care provider if: You often feel depressed. You have ever been abused or do not feel safe at home. Summary Adopting a healthy lifestyle and getting preventive care are important in promoting health and wellness. Follow your health care provider's instructions about healthy diet, exercising, and getting tested or screened for diseases. Follow your health care provider's instructions on monitoring your cholesterol and blood pressure. This information is not intended to replace advice given to you by your health care provider. Make sure you discuss any questions you have with your health care provider. Document Revised: 12/21/2020 Document Reviewed: 12/21/2020 Elsevier Patient Education  2023 Elsevier Inc.  

## 2021-12-30 NOTE — Telephone Encounter (Signed)
Referral resubmitted to [facility]  Nobie Putnam, [facility] 12/30/2021, 12:09 PM

## 2021-12-30 NOTE — Telephone Encounter (Signed)
The pt had a AWV telephone visit today and complained of some bilateral hearing loss. He report that his spouse has been complaining of this for awhile. He also saw someone in the past for this and was told that he had some hearing loss, but it was not significant enough to address at the time. 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 He would like a referral placed to ENT/Audiology.

## 2021-12-30 NOTE — Progress Notes (Addendum)
Subjective:  I connected with Duane Grant today by telephone and verified that I am speaking with the correct person using two identifiers. Location patient: home Location provider: work Persons participating in the virtual visit: Duane Grant, Duane Mesa, CNA, CMA   I discussed the limitations, risks, security and privacy concerns of performing an evaluation and management service by telephone and the availability of in person appointments. I also discussed with the patient that there may be a patient responsible charge related to this service. The patient expressed understanding and verbally consented to this telephonic visit.     Duane Grant is a 66 y.o. male who presents for Medicare Annual/Subsequent preventive examination.  Review of Systems    Per HPI unless specifically indicated below        Objective:    Today's Vitals   12/30/21 0903  PainSc: 0-No pain   There is no height or weight on file to calculate BMI.     12/22/2020    8:19 AM 05/23/2020    8:20 AM 12/17/2019   10:03 AM 08/25/2016    5:29 PM 06/12/2016    1:57 PM 06/09/2016   12:15 PM 06/09/2016    6:30 AM  Advanced Directives  Does Patient Have a Medical Advance Directive? No No No No No No No  Would patient like information on creating a medical advance directive?     No - patient declined information No - patient declined information     Current Medications (verified) Outpatient Encounter Medications as of 12/30/2021  Medication Sig   lisinopril-hydrochlorothiazide (ZESTORETIC) 10-12.5 MG tablet Take 0.5 tablets by mouth daily.   meloxicam (MOBIC) 7.5 MG tablet Take 7.5 mg by mouth daily as needed for pain.   metFORMIN (GLUCOPHAGE) 500 MG tablet Take 1 tablet (500 mg total) by mouth daily with supper.   Omega-3 Fatty Acids (FISH OIL) 1200 MG CAPS Take 1 capsule by mouth 2 (two) times daily.   OZEMPIC, 1 MG/DOSE, 4 MG/3ML SOPN Inject 1 mg into the skin once a week.   pravastatin (PRAVACHOL) 10 MG  tablet Take 1 tablet (10 mg total) by mouth daily.   tamsulosin (FLOMAX) 0.4 MG CAPS capsule Take 1 capsule (0.4 mg total) by mouth daily after supper.   valACYclovir (VALTREX) 1000 MG tablet Take 1 tablet (1,000 mg total) by mouth daily as needed (HSV flare). For up to 5-7 as needed for flare, can repeat.   [DISCONTINUED] cyclobenzaprine (FLEXERIL) 10 MG tablet Take 1 tablet (10 mg total) by mouth 2 (two) times daily as needed for muscle spasms. (Patient not taking: Reported on 12/30/2021)   [DISCONTINUED] naproxen (NAPROSYN) 500 MG tablet Take 1 tablet (500 mg total) by mouth 2 (two) times daily with a meal. For 2 weeks then as needed (Patient not taking: Reported on 12/30/2021)   No facility-administered encounter medications on file as of 12/30/2021.    Allergies (verified) Patient has no known allergies.   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   Procedure: TOTAL HIP ARTHROPLASTY;  Surgeon: Thornton Park, MD;  Location: [facility] ORS;  Service: Orthopedics;  Laterality: Left;   Family History  Problem Relation Age of Onset   Diabetes Mother    Hypertension Mother    Social History   Socioeconomic History   Marital status: Married    Spouse name: Duane Grant   Number of children: 3   Years of education: High School   Highest education level: High school graduate  Occupational History   Occupation: Truck Geophysicist/field seismologist  Tobacco Use   Smoking status: Former    Packs/day: 0.50    Years: 35.00    Pack years: 17.50    Types:  Cigarettes    Quit date: 11/14/1994    Years since quitting: 27.1   Smokeless tobacco: Former  Scientific laboratory technician Use: Never used  Substance and Sexual Activity   Alcohol use: Yes    Comment: occassional   Drug use: No   Sexual activity: Yes    Birth control/protection: None  Other Topics Concern   Not on file  Social History Narrative   Not on file   Social Determinants of Health   Financial Resource Strain: Low Risk    Difficulty of Paying Living Expenses: Not hard at all  Food Insecurity: No Food Insecurity   Worried About Charity fundraiser in the Last Year: Never true   [place] in the Last Year: Never true  Transportation Needs: No Transportation Needs   Lack of Transportation (Medical): No   Lack of Transportation (Non-Medical): No  Physical Activity: Sufficiently Active   Days of Exercise per Week: 5 days   Minutes of Exercise per Session: 30 min  Stress: No Stress Concern Present   Feeling of Stress : Not at all  Social Connections: Socially Integrated   Frequency of Communication with Friends and Family: More than three times a week   Frequency of Social Gatherings with Friends and Family: More than three times a week   Attends Religious Services: More than 4 times per year   Active Member of [employer] or Organizations: Yes   Attends Music therapist: More than 4 times per year   Marital Status: Married    Tobacco Counseling Counseling given: Not Answered   Clinical Intake:  Pre-visit preparation completed: No  Pain : No/denies pain Pain Score: 0-No pain     Nutritional Status: BMI of 19-24  Normal Nutritional Risks: None Diabetes: Yes CBG done?: Yes CBG resulted in Enter/ Edit results?: Yes Did pt. bring in CBG monitor from home?: No  How often do you need to have someone help you when you read instructions, pamphlets, or other written materials from your doctor or pharmacy?: 1 - Never  Diabetic?Nutrition Risk  Assessment:  Has the patient had any N/V/D within the last 2 months?  No  Does the patient have any non-healing wounds?  No  Has the patient had any unintentional weight loss or weight gain?  No   Diabetes:  Is the patient diabetic?  Yes  If diabetic, was a CBG obtained today?  Yes  Did the patient bring in their glucometer from home?  No  How often do you monitor your CBG's? Daily .   Financial Strains and Diabetes Management:  Are you having any financial strains with the device, your supplies or your medication? No .  Does the patient want  to be seen by Chronic Care Management for management of their diabetes?  No  Would the patient like to be referred to a Nutritionist or for Diabetic Management?  No   Diabetic Exams:  Diabetic Eye Exam: Completed 12/13/2021 Diabetic Foot Exam: Completed 0919/2022    Interpreter Needed?: No  Information entered by :: Duane Grant, CMA   Activities of Daily Living    12/30/2021    9:04 AM  In your present state of health, do you have any difficulty performing the following activities:  Hearing? 1  Vision? 1  Difficulty concentrating or making decisions? 1  Walking or climbing stairs? 0  Dressing or bathing? 0  Doing errands, shopping? 0    Patient Care Team: Olin Hauser, DO as PCP - General (Family Medicine) [facility], [place],                                                   [facility]  Indicate any recent Medical Services you may have received from other than [facility] providers in the past year (date may be approximate).  No hospitalization in the past 12 months.    Assessment:   This is a routine wellness examination for Duane Grant.  Hearing/Vision screen No results found.  Dietary issues and exercise activities discussed: Current Exercise Habits: Structured exercise class, Type of exercise: Other - see comments (stationary bike), Time (Minutes): 30, Frequency  (Times/Week): 3, Weekly Exercise (Minutes/Week): 90, Intensity: Moderate   Goals Addressed             This Visit's Progress    Exercise 150 min/wk Moderate Activity        Depression Screen    12/30/2021    8:52 AM 11/15/2021    7:58 AM 12/22/2020    8:19 AM 05/11/2020    8:11 AM 12/23/2019    9:15 AM 12/17/2019   10:03 AM 04/29/2019    9:28 AM  PHQ 2/9 Scores  PHQ - 2 Score 0 2 0 0 0 0 0  PHQ- 9 Score 3 4         Fall Risk    12/30/2021    9:04 AM 11/15/2021    7:58 AM 12/22/2020    8:19 AM 05/11/2020    8:11 AM 12/23/2019    9:15 AM  Fall Risk   Falls in the past year? 0 0 0 0 0  Number falls in past yr: 0 0  0 0  Injury with Fall? 0 0  0 0  Risk for fall due to : No Fall Risks No Fall Risks Medication side effect    Follow up Falls evaluation completed Falls evaluation completed Falls evaluation completed;Education provided;Falls prevention discussed Falls evaluation completed Falls evaluation completed    FALL RISK PREVENTION PERTAINING TO THE HOME:  Any stairs in or around the home? Yes       If so, are there any without handrails? Yes  Home free of loose throw rugs in walkways, pet beds, electrical cords, etc? Yes  Adequate lighting in your home to reduce risk of falls? Yes  ASSISTIVE DEVICES UTILIZED TO PREVENT FALLS:  Life alert? No  Use of a cane, walker or w/c? No  Grab bars in the bathroom? No  Shower chair or bench in shower? No  Elevated toilet seat or a handicapped toilet? No  Cognitive Function:        12/30/2021    9:12 AM 12/22/2020    8:21 AM  6CIT Screen  What Year? 0 points 0 points  What month? 0 points 0 points  What time? 0 points 0 points  Count back from 20 0 points 0 points  Months in reverse 0 points 0 points  Repeat phrase 0 points 0 points  Total Score 0 points 0 points    Immunizations Immunization History  Administered Date(s) Administered   Fluad Quad(high Dose 65+) 05/27/2020   Influenza, High Dose Seasonal PF 10/07/2018,  07/24/2019   Influenza, Quadrivalent, Recombinant, Inj, Pf 05/20/2019   Influenza-Unspecified 05/20/2019, 06/14/2021   PFIZER Comirnaty(Gray Top)Covid-19 Tri-Sucrose Vaccine 11/26/2019, 12/17/2019   PFIZER(Purple Top)SARS-COV-2 Vaccination 11/26/2019, 12/17/2019, 07/18/2020   Pneumococcal Conjugate-13 05/20/2019   Pneumococcal Polysaccharide-23 06/09/2020   Tdap 05/16/2020   Zoster Recombinat (Shingrix) 05/20/2019, 07/21/2019    TDAP status: Up to date  Flu Vaccine status: Up to date  Pneumococcal vaccine status: Up to date  Covid-19 vaccine status: Completed vaccines  Qualifies for Shingles Vaccine? Yes   Zostavax completed Yes   Shingrix Completed?: Yes  Screening Tests Health Maintenance  Topic Date Due   COVID-19 Vaccine (6 - Booster) 09/12/2020   INFLUENZA VACCINE  03/15/2022   FOOT EXAM  05/03/2022   HEMOGLOBIN A1C  05/12/2022   Fecal DNA (Cologuard)  11/19/2022   OPHTHALMOLOGY EXAM  12/14/2022   TETANUS/TDAP  05/16/2030   Pneumonia Vaccine 1+ Years old  Completed   Hepatitis C Screening  Completed   Zoster Vaccines- Shingrix  Completed   HPV VACCINES  Aged Out    Health Maintenance  Health Maintenance Due  Topic Date Due   COVID-19 Vaccine (6 - Booster) 09/12/2020    Colorectal cancer screening: Type of screening: Cologuard. Completed 11/19/2019. Repeat every 3 years  Lung Cancer Screening: (Low Dose CT Chest recommended if Age 69-80 years, 30 pack-year currently smoking OR have quit w/in 15years.) does not qualify.   Lung Cancer Screening Referral: does not qualify  Additional Screening:  Hepatitis C Screening: does qualify; Completed 10/26/2020  Vision Screening: Recommended annual ophthalmology exams for early detection of glaucoma and other disorders of the eye. Is the patient up to date with their annual eye exam?  Yes  Who is the provider or what is the name of the office in which the patient attends annual eye exams? [facility] If pt is not  established with a provider, would they like to be referred to a provider to establish care? No .   Dental Screening: Recommended annual dental exams for proper oral hygiene  Community Resource Referral / Chronic Care Management: CRR required this visit?  No   CCM required this visit?  No      Plan:     I have personally reviewed and noted the following in the patient's chart:   Medical and social history Use of alcohol, tobacco or illicit drugs  Current medications and supplements including opioid prescriptions. Patient is not currently taking opioid prescriptions. Functional ability and status Nutritional status Physical activity Advanced directives List of other physicians Hospitalizations, surgeries, and ER visits in previous 12 months Vitals Screenings to include cognitive, depression, and falls Referrals and  appointments  In addition, I have reviewed and discussed with patient certain preventive protocols, quality metrics, and best practice recommendations. A written personalized care plan for preventive services as well as general preventive health recommendations were provided to patient.     Duane Grant, [place]   12/30/2021   Nurse Notes:  Non-face-to-face visit 30 minutes. The pt complains of some bilateral hearing loss. He report that his spouse has been complaining of this for awhile. He also saw someone in the past for this and was told that he had some hearing loss but it was not significant enough to address at that time. 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 He would like a referral placed to ENT/Audiology.   Mr. Hausmann , Thank you for taking time to come for your Medicare Wellness Visit. I appreciate your ongoing commitment to your health goals. Please review the following plan we discussed and let me know if I can assist you in the future.   These are the goals we discussed:  Goals      Exercise 150 min/wk  Moderate Activity     Patient Stated     12/22/2020, wants to weigh 190 pounds        This is a list of the screening recommended for you and due dates:  Health Maintenance  Topic Date Due   COVID-19 Vaccine (6 - Booster) 09/12/2020   Flu Shot  03/15/2022   Complete foot exam   05/03/2022   Hemoglobin A1C  05/12/2022   Cologuard (Stool DNA test)  11/19/2022   Eye exam for diabetics  12/14/2022   Tetanus Vaccine  05/16/2030   Pneumonia Vaccine  Completed   Hepatitis C Screening: USPSTF Recommendation to screen - Ages 18-79 yo.  Completed   Zoster (Shingles) Vaccine  Completed   HPV Vaccine  Aged Out

## 2022-02-04 DIAGNOSIS — C61 Malignant neoplasm of prostate: Secondary | ICD-10-CM | POA: Diagnosis not present

## 2022-02-04 DIAGNOSIS — Z79818 Long term (current) use of other agents affecting estrogen receptors and estrogen levels: Secondary | ICD-10-CM | POA: Diagnosis not present

## 2022-03-16 DIAGNOSIS — Z7984 Long term (current) use of oral hypoglycemic drugs: Secondary | ICD-10-CM | POA: Diagnosis not present

## 2022-03-16 DIAGNOSIS — R634 Abnormal weight loss: Secondary | ICD-10-CM | POA: Diagnosis not present

## 2022-03-16 DIAGNOSIS — Z79899 Other long term (current) drug therapy: Secondary | ICD-10-CM | POA: Diagnosis not present

## 2022-03-16 DIAGNOSIS — Z79818 Long term (current) use of other agents affecting estrogen receptors and estrogen levels: Secondary | ICD-10-CM | POA: Diagnosis not present

## 2022-03-16 DIAGNOSIS — M8589 Other specified disorders of bone density and structure, multiple sites: Secondary | ICD-10-CM | POA: Diagnosis not present

## 2022-03-16 DIAGNOSIS — R232 Flushing: Secondary | ICD-10-CM | POA: Diagnosis not present

## 2022-03-16 DIAGNOSIS — Z192 Hormone resistant malignancy status: Secondary | ICD-10-CM | POA: Diagnosis not present

## 2022-03-16 DIAGNOSIS — R079 Chest pain, unspecified: Secondary | ICD-10-CM | POA: Diagnosis not present

## 2022-03-16 DIAGNOSIS — Z87891 Personal history of nicotine dependence: Secondary | ICD-10-CM | POA: Diagnosis not present

## 2022-03-16 DIAGNOSIS — Z6832 Body mass index (BMI) 32.0-32.9, adult: Secondary | ICD-10-CM | POA: Diagnosis not present

## 2022-03-16 DIAGNOSIS — C61 Malignant neoplasm of prostate: Secondary | ICD-10-CM | POA: Diagnosis not present

## 2022-03-16 DIAGNOSIS — E119 Type 2 diabetes mellitus without complications: Secondary | ICD-10-CM | POA: Diagnosis not present

## 2022-03-16 DIAGNOSIS — Z923 Personal history of irradiation: Secondary | ICD-10-CM | POA: Diagnosis not present

## 2022-04-11 DIAGNOSIS — I10 Essential (primary) hypertension: Secondary | ICD-10-CM | POA: Diagnosis not present

## 2022-04-11 DIAGNOSIS — R0602 Shortness of breath: Secondary | ICD-10-CM | POA: Diagnosis not present

## 2022-04-11 DIAGNOSIS — Z8546 Personal history of malignant neoplasm of prostate: Secondary | ICD-10-CM | POA: Diagnosis not present

## 2022-04-11 DIAGNOSIS — R931 Abnormal findings on diagnostic imaging of heart and coronary circulation: Secondary | ICD-10-CM | POA: Diagnosis not present

## 2022-04-11 DIAGNOSIS — R079 Chest pain, unspecified: Secondary | ICD-10-CM | POA: Diagnosis not present

## 2022-04-22 DIAGNOSIS — R0602 Shortness of breath: Secondary | ICD-10-CM | POA: Diagnosis not present

## 2022-04-22 DIAGNOSIS — R079 Chest pain, unspecified: Secondary | ICD-10-CM | POA: Diagnosis not present

## 2022-04-22 DIAGNOSIS — R931 Abnormal findings on diagnostic imaging of heart and coronary circulation: Secondary | ICD-10-CM | POA: Diagnosis not present

## 2022-04-22 DIAGNOSIS — I10 Essential (primary) hypertension: Secondary | ICD-10-CM | POA: Diagnosis not present

## 2022-04-22 DIAGNOSIS — Z8546 Personal history of malignant neoplasm of prostate: Secondary | ICD-10-CM | POA: Diagnosis not present

## 2022-05-06 DIAGNOSIS — Z79818 Long term (current) use of other agents affecting estrogen receptors and estrogen levels: Secondary | ICD-10-CM | POA: Diagnosis not present

## 2022-05-06 DIAGNOSIS — Z8546 Personal history of malignant neoplasm of prostate: Secondary | ICD-10-CM | POA: Diagnosis not present

## 2022-05-06 DIAGNOSIS — R079 Chest pain, unspecified: Secondary | ICD-10-CM | POA: Diagnosis not present

## 2022-05-06 DIAGNOSIS — C61 Malignant neoplasm of prostate: Secondary | ICD-10-CM | POA: Diagnosis not present

## 2022-05-06 DIAGNOSIS — M2550 Pain in unspecified joint: Secondary | ICD-10-CM | POA: Diagnosis not present

## 2022-05-06 DIAGNOSIS — R931 Abnormal findings on diagnostic imaging of heart and coronary circulation: Secondary | ICD-10-CM | POA: Diagnosis not present

## 2022-05-06 DIAGNOSIS — R0602 Shortness of breath: Secondary | ICD-10-CM | POA: Diagnosis not present

## 2022-05-06 DIAGNOSIS — Z79899 Other long term (current) drug therapy: Secondary | ICD-10-CM | POA: Diagnosis not present

## 2022-05-06 DIAGNOSIS — I10 Essential (primary) hypertension: Secondary | ICD-10-CM | POA: Diagnosis not present

## 2022-05-17 ENCOUNTER — Other Ambulatory Visit: Payer: Self-pay | Admitting: Family Medicine

## 2022-05-17 DIAGNOSIS — E1169 Type 2 diabetes mellitus with other specified complication: Secondary | ICD-10-CM

## 2022-05-17 NOTE — Telephone Encounter (Signed)
Requested Prescriptions  Pending Prescriptions Disp Refills  . metFORMIN (GLUCOPHAGE) 500 MG tablet [Pharmacy Med Name: metFORMIN HCl 500 MG Oral Tablet] 90 tablet 0    Sig: TAKE 1 TABLET BY MOUTH ONCE DAILY WITH SUPPER     Endocrinology:  Diabetes - Biguanides Failed - 05/17/2022  9:15 AM      Failed - HBA1C is between 0 and 7.9 and within 180 days    Hemoglobin A1C  Date Value Ref Range Status  10/23/2018 9.8  Final   Hgb A1c MFr Bld  Date Value Ref Range Status  11/09/2021 7.1 (H) <5.7 % of total Hgb Final    Comment:    For someone without known diabetes, a hemoglobin A1c value of 6.5% or greater indicates that they may have  diabetes and this should be confirmed with a follow-up  test. . For someone with known diabetes, a value <7% indicates  that their diabetes is well controlled and a value  greater than or equal to 7% indicates suboptimal  control. A1c targets should be individualized based on  duration of diabetes, age, comorbid conditions, and  other considerations. . Currently, no consensus exists regarding use of hemoglobin A1c for diagnosis of diabetes for children. .          Failed - eGFR in normal range and within 360 days    GFR, Est African American  Date Value Ref Range Status  10/26/2020 104 > OR = 60 mL/min/1.25m2 Final   GFR, Est Non African American  Date Value Ref Range Status  10/26/2020 90 > OR = 60 mL/min/1.63m2 Final   eGFR  Date Value Ref Range Status  04/26/2021 95 > OR = 60 mL/min/1.109m2 Final    Comment:    The eGFR is based on the CKD-EPI 2021 equation. To calculate  the new eGFR from a previous Creatinine or Cystatin C result, go to https://www.kidney.org/professionals/ kdoqi/gfr%5Fcalculator          Failed - B12 Level in normal range and within 720 days    No results found for: "VITAMINB12"       Passed - Cr in normal range and within 360 days    Creat  Date Value Ref Range Status  11/09/2021 0.91 0.70 - 1.35 mg/dL Final    Creatinine, Urine  Date Value Ref Range Status  11/15/2021 83 20 - 320 mg/dL Final         Passed - Valid encounter within last 6 months    Recent Outpatient Visits          6 months ago Annual physical exam   [place], DO   1 year ago Type 2 diabetes mellitus with other specified complication, without long-term current use of insulin ([place])   [place], Devonne Doughty, DO   1 year ago Annual physical exam   [facility] Olin Hauser, DO   1 year ago Encounter for Department of Transportation (DOT) examination for trucking license   [facility], Lupita Raider, FNP   2 years ago Type 2 diabetes mellitus with other specified complication, without long-term current use of insulin [facility])   [place], Devonne Doughty, DO      Future Appointments            In 6 days [place], Devonne Doughty, DO [facility] [place] -  CBC within normal limits and completed in the last 12 months    WBC  Date Value Ref Range Status  11/09/2021 6.0 3.8 - 10.8 Thousand/uL Final   RBC  Date Value Ref Range Status  11/09/2021 4.09 (L) 4.20 - 5.80 Million/uL Final   Hemoglobin  Date Value Ref Range Status  11/09/2021 13.4 13.2 - 17.1 g/dL Final   HCT  Date Value Ref Range Status  11/09/2021 39.4 38.5 - 50.0 % Final   MCHC  Date Value Ref Range Status  11/09/2021 34.0 32.0 - 36.0 g/dL Final   [facility]  Date Value Ref Range Status  11/09/2021 32.8 27.0 - 33.0 pg Final   MCV  Date Value Ref Range Status  11/09/2021 96.3 80.0 - 100.0 fL Final   No results found for: "PLTCOUNTKUC", "LABPLAT", "POCPLA" RDW  Date Value Ref Range Status  11/09/2021 11.8 11.0 - 15.0 % Final         . OZEMPIC, 1 MG/DOSE, 4 MG/3ML SOPN [Pharmacy Med Name: Ozempic (1 MG/DOSE) 4 MG/3ML Subcutaneous Solution Pen-injector] 3 mL 0    Sig: INJECT 1 MG  SUBCUTANEOUSLY ONCE A WEEK     Endocrinology:  Diabetes - GLP-1 Receptor Agonists - semaglutide Failed - 05/17/2022  9:15 AM      Failed - HBA1C in normal range and within 180 days    Hemoglobin A1C  Date Value Ref Range Status  10/23/2018 9.8  Final   Hgb A1c MFr Bld  Date Value Ref Range Status  11/09/2021 7.1 (H) <5.7 % of total Hgb Final    Comment:    For someone without known diabetes, a hemoglobin A1c value of 6.5% or greater indicates that they may have  diabetes and this should be confirmed with a follow-up  test. . For someone with known diabetes, a value <7% indicates  that their diabetes is well controlled and a value  greater than or equal to 7% indicates suboptimal  control. A1c targets should be individualized based on  duration of diabetes, age, comorbid conditions, and  other considerations. . Currently, no consensus exists regarding use of hemoglobin A1c for diagnosis of diabetes for children. .          Passed - Cr in normal range and within 360 days    Creat  Date Value Ref Range Status  11/09/2021 0.91 0.70 - 1.35 mg/dL Final   Creatinine, Urine  Date Value Ref Range Status  11/15/2021 83 20 - 320 mg/dL Final         Passed - Valid encounter within last 6 months    Recent Outpatient Visits          6 months ago Annual physical exam   [place], DO   1 year ago Type 2 diabetes mellitus with other specified complication, without long-term current use of insulin [facility])   [place], Devonne Doughty, DO   1 year ago Annual physical exam   [facility] Olin Hauser, DO   1 year ago Encounter for Department of Transportation (DOT) examination for trucking license   [facility], Lupita Raider, FNP   2 years ago Type 2 diabetes mellitus with other specified complication, without long-term current use of insulin [facility])   [place], Devonne Doughty, DO      Future Appointments            In 6 days Parks Ranger,  [facility], [facility]

## 2022-05-23 ENCOUNTER — Encounter: Payer: Self-pay | Admitting: Family Medicine

## 2022-05-23 ENCOUNTER — Ambulatory Visit (INDEPENDENT_AMBULATORY_CARE_PROVIDER_SITE_OTHER): Payer: BC Managed Care – PPO | Admitting: Family Medicine

## 2022-05-23 VITALS — BP 125/67 | HR 76 | Ht 68.0 in | Wt 209.0 lb

## 2022-05-23 DIAGNOSIS — E1169 Type 2 diabetes mellitus with other specified complication: Secondary | ICD-10-CM

## 2022-05-23 DIAGNOSIS — N401 Enlarged prostate with lower urinary tract symptoms: Secondary | ICD-10-CM

## 2022-05-23 DIAGNOSIS — I10 Essential (primary) hypertension: Secondary | ICD-10-CM | POA: Diagnosis not present

## 2022-05-23 DIAGNOSIS — Z23 Encounter for immunization: Secondary | ICD-10-CM | POA: Diagnosis not present

## 2022-05-23 DIAGNOSIS — C61 Malignant neoplasm of prostate: Secondary | ICD-10-CM

## 2022-05-23 LAB — POCT GLYCOSYLATED HEMOGLOBIN (HGB A1C): Hemoglobin A1C: 7.9 % — AB (ref 4.0–5.6)

## 2022-05-23 MED ORDER — TAMSULOSIN HCL 0.4 MG PO CAPS: 0.4000 mg | ORAL_CAPSULE | Freq: Every day | ORAL | 3 refills | Status: DC

## 2022-05-23 MED ORDER — OZEMPIC (1 MG/DOSE) 4 MG/3ML ~~LOC~~ SOPN: 1.0000 mg | PEN_INJECTOR | SUBCUTANEOUS | 3 refills | Status: DC

## 2022-05-23 MED ORDER — LISINOPRIL-HYDROCHLOROTHIAZIDE 10-12.5 MG PO TABS: 0.5000 | ORAL_TABLET | Freq: Every day | ORAL | 3 refills | Status: DC

## 2022-05-23 MED ORDER — METFORMIN HCL 500 MG PO TABS: 500.0000 mg | ORAL_TABLET | Freq: Every day | ORAL | 3 refills | Status: DC

## 2022-05-23 NOTE — Progress Notes (Signed)
Subjective:    Patient ID: Duane Grant, male    DOB: 1956-02-13, 66 y.o.   MRN: [id number]  Duane Grant is a 66 y.o. male presenting on 05/23/2022 for Diabetes   HPI  CHRONIC DM, Type 2: Hyperlipidemia   Improved cholesterol on last lab now on Pravastatin A1c previously 7-9 range. Has maintained More physical job with resumed working. Last result A1c 7.9 Meds: Metformin 500mg  with dinner, Ozempic 1mg  weekly inj Reports good compliance. 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   Statin Myalgia Drug induced myopathy Hyperlipidemia On Pravastatin 10mg  with improvement. Not having myalgia Lipid controlled   CHRONIC HTN: BP controlled Current Meds - Lisinopril-HCTZ 10-12.5mg  (HALF pill) daily  - has refills Reports good compliance, took meds today. Tolerating well, w/o complaints. Denies CP, dyspnea, HA, edema, dizziness / lightheadedness  Prostate Cancer Followed by by [facility] S/p Radiation 2010 - Improved now. Castrate resistant PSA, improved to < 0.01, on Darolutamide - Currently on Nubeqa therapy - On ADT Lupron Still followed by [facility], Cardiology On Flomax     Chronic Low Back Pain he got 2nd opinion with [facility], saw Altamese Cabal PA, has had Lumbar MRI - PT regimen in past. - On methocarbamol, has plenty of med, takes PRN  Health Maintenance: Flu Shot     05/23/2022    8:16 AM 12/30/2021    8:52 AM 11/15/2021    7:58 AM  Depression screen PHQ 2/9  Decreased Interest 0 0 2  Down, Depressed, Hopeless 0 0 0  PHQ - 2 Score 0 0 2  Altered sleeping 0 3 2  Tired, decreased energy 0 0 0  Change in appetite 0 0 0  Feeling bad or failure about yourself  0 0 0  Trouble concentrating 0 0 0   Moving slowly or fidgety/restless 0 0 0  Suicidal thoughts 0 0 0  PHQ-9 Score 0 3 4  Difficult doing work/chores Not difficult at all Not difficult at all Not difficult at all    Past Medical History:  Diagnosis Date   Arthritis    Cancer [facility]) 2009   Prostate, radiation but no surgery   Elevated PSA    H/O removal of cyst 12/02/2019   back    History of prostate cancer    Hyperlipidemia    Hypertension    Past Surgical History:  Procedure Laterality Date   HERNIA REPAIR  2010   Umbilical   HERNIA REPAIR  2010   Removal Fatty Tumor Right 2015   [facility], right side of chin/jaw   TOOTH EXTRACTION  12/15/2020   TOTAL HIP ARTHROPLASTY Left 06/09/2016   Procedure: TOTAL HIP ARTHROPLASTY;  Surgeon: Juanell Fairly, MD;  Location: [facility] ORS;  Service: Orthopedics;  Laterality: Left;   Social History   Socioeconomic History   Marital status: Married    Spouse name: Salif Servellon   Number of children: 3   Years of education: High School   Highest education level: High school graduate  Occupational History   Occupation: Truck Hospital doctor  Tobacco Use   Smoking status: Former    Packs/day: 0.50    Years: 35.00    Total pack years: 17.50    Types: Cigarettes    Quit date: 11/14/1994  Years since quitting: 27.5   Smokeless tobacco: Former  Building services engineer Use: Never used  Substance and Sexual Activity   Alcohol use: Yes    Comment: occassional   Drug use: No   Sexual activity: Yes    Birth control/protection: None  Other Topics Concern   Not on file  Social History Narrative   Not on file   Social Determinants of Health   Financial Resource Strain: Low Risk  (12/30/2021)   Overall Financial Resource Strain (CARDIA)    Difficulty of Paying Living Expenses: Not hard at all  Food Insecurity: No Food Insecurity (12/30/2021)   Hunger Vital Sign    Worried About Running Out of Food in the Last Year: Never true    Ran Out of Food in the Last Year: Never true   Transportation Needs: No Transportation Needs (12/30/2021)   PRAPARE - Administrator, Civil Service (Medical): No    Lack of Transportation (Non-Medical): No  Physical Activity: Sufficiently Active (12/30/2021)   Exercise Vital Sign    Days of Exercise per Week: 5 days    Minutes of Exercise per Session: 30 min  Stress: No Stress Concern Present (12/30/2021)   [employer] of Occupational Health - Occupational Stress Questionnaire    Feeling of Stress : Not at all  Social Connections: Socially Integrated (12/30/2021)   Social Connection and Isolation Panel [NHANES]    Frequency of Communication with Friends and Family: More than three times a week    Frequency of Social Gatherings with Friends and Family: More than three times a week    Attends Religious Services: More than 4 times per year    Active Member of [employer] or Organizations: Yes    Attends Engineer, structural: More than 4 times per year    Marital Status: Married  Catering manager Violence: Not At Risk (12/30/2021)   Humiliation, Afraid, Rape, and Kick questionnaire    Fear of Current or Ex-Partner: No    Emotionally Abused: No    Physically Abused: No    Sexually Abused: No   Family History  Problem Relation Age of Onset   Diabetes Mother    Hypertension Mother    Current Outpatient Medications on File Prior to Visit  Medication Sig   meloxicam (MOBIC) 7.5 MG tablet Take 7.5 mg by mouth daily as needed for pain.   Omega-3 Fatty Acids (FISH OIL) 1200 MG CAPS Take 1 capsule by mouth 2 (two) times daily.   pravastatin (PRAVACHOL) 10 MG tablet Take 1 tablet (10 mg total) by mouth daily.   valACYclovir (VALTREX) 1000 MG tablet Take 1 tablet (1,000 mg total) by mouth daily as needed (HSV flare). For up to 5-7 as needed for flare, can repeat.   No current facility-administered medications on file prior to visit.    Review of Systems Per HPI unless specifically indicated above     Objective:     BP 125/67   Pulse 76   Ht 5\' 8"  (1.727 m)   Wt 209 lb (94.8 kg)   SpO2 100%   BMI 31.78 kg/m   Wt Readings from Last 3 Encounters:  05/23/22 209 lb (94.8 kg)  11/15/21 211 lb 12.8 oz (96.1 kg)  05/03/21 216 lb 6.4 oz (98.2 kg)    Physical Exam Vitals and nursing note reviewed.  Constitutional:      General: He is not in acute distress.    Appearance: He is well-developed. He is  not diaphoretic.     Comments: Well-appearing, comfortable, cooperative  HENT:     Head: Normocephalic and atraumatic.  Eyes:     General:        Right eye: No discharge.        Left eye: No discharge.     Conjunctiva/sclera: Conjunctivae normal.  Neck:     Thyroid: No thyromegaly.  Cardiovascular:     Rate and Rhythm: Normal rate and regular rhythm.     Pulses: Normal pulses.     Heart sounds: Normal heart sounds. No murmur heard. Pulmonary:     Effort: Pulmonary effort is normal. No respiratory distress.     Breath sounds: Normal breath sounds. No wheezing or rales.  Musculoskeletal:        General: Normal range of motion.     Cervical back: Normal range of motion and neck supple.  Lymphadenopathy:     Cervical: No cervical adenopathy.  Skin:    General: Skin is warm and dry.     Findings: No erythema or rash.  Neurological:     Mental Status: He is alert and oriented to person, place, and time. Mental status is at baseline.  Psychiatric:        Behavior: Behavior normal.     Comments: Well groomed, good eye contact, normal speech and thoughts     Diabetic Foot Exam - Simple   Simple Foot Form Diabetic Foot exam was performed with the following findings: Yes 05/23/2022  8:26 AM  Visual Inspection No deformities, no ulcerations, no other skin breakdown bilaterally: Yes Sensation Testing Intact to touch and monofilament testing bilaterally: Yes Pulse Check Posterior Tibialis and Dorsalis pulse intact bilaterally: Yes Comments     Results for orders placed or performed in visit on  05/23/22  POCT HgB A1C  Result Value Ref Range   Hemoglobin A1C 7.9 (A) 4.0 - 5.6 %      Assessment & Plan:   Problem List Items Addressed This Visit     Essential hypertension    Controlled HTN - Home BP readings reviewed, normal  No known complications    Plan:  1. Continue current BP regimen -  Lisinopril-HCTZ 10-12.5mg  (HALF pill) daily refill 2. Encourage improved lifestyle - low sodium diet, regular exercise 3. Continue monitor BP outside office, bring readings to next visit, if persistently >140/90 or new symptoms notify office sooner      Relevant Medications   lisinopril-hydrochlorothiazide (ZESTORETIC) 10-12.5 MG tablet   Prostate cancer (HCC)    Followed by [facility] Managed for prostate cancer, prior treatment/radiation On Medication management Last PSA now < 0.01 significant improvement       Type 2 diabetes mellitus with other specified complication (HCC) - Primary    A1c up to 7.9 Improved on GLP1 but some side effects Admits some issue with prostate cancer treatment impacting his sugar Complications - hyperglycemia, hyperlipidemia - increases risk of future cardiovascular complications  - Failed Jardiance d/t yeast infection, off in 2020  Plan:  1. Continue Ozempic 1mg  weekly, Continue Metformin 500mg  nightly 3. Encourage improved lifestyle - low carb, low sugar diet, reduce portion size, continue improving regular exercise 4. Check CBG , bring log to next visit for review 5. Continue ASA, ACEi, statin DM Foot      Relevant Medications   lisinopril-hydrochlorothiazide (ZESTORETIC) 10-12.5 MG tablet   OZEMPIC, 1 MG/DOSE, 4 MG/3ML SOPN   metFORMIN (GLUCOPHAGE) 500 MG tablet   Other Relevant Orders   POCT HgB  A1C (Completed)   Other Visit Diagnoses     Benign prostatic hyperplasia with lower urinary tract symptoms, symptom details unspecified       Relevant Medications   tamsulosin (FLOMAX) 0.4 MG CAPS capsule   Needs flu shot        Relevant Orders   Flu Vaccine QUAD High Dose(Fluad)       Meds ordered this encounter  Medications   lisinopril-hydrochlorothiazide (ZESTORETIC) 10-12.5 MG tablet    Sig: Take 0.5 tablets by mouth daily.    Dispense:  45 tablet    Refill:  3   OZEMPIC, 1 MG/DOSE, 4 MG/3ML SOPN    Sig: Inject 1 mg as directed once a week.    Dispense:  9 mL    Refill:  3   metFORMIN (GLUCOPHAGE) 500 MG tablet    Sig: Take 1 tablet (500 mg total) by mouth daily with supper.    Dispense:  90 tablet    Refill:  3   tamsulosin (FLOMAX) 0.4 MG CAPS capsule    Sig: Take 1 capsule (0.4 mg total) by mouth daily after supper.    Dispense:  90 capsule    Refill:  3      Follow up plan: Return in about 6 months (around 11/22/2022) for 6 month fasting lab only then 1 week later Annual Physical.  Future Labs CMET CBC Lipid A1c TSH VIt D   Saralyn Pilar, DO [facility] 05/23/2022, 8:12 AM

## 2022-05-23 NOTE — Patient Instructions (Addendum)
Thank you for coming to the office today.  Recent Labs    11/09/21 0815 05/23/22 0814  HGBA1C 7.1* 7.9*   Elevated A1c glucose, I am hopeful that you can maintain the blood sugar with more exercise and diet updates to maintain in the A1c 7  Keep on current therapy [facility].  Refill medications today.  Flu Shot today.  For Constipation (less frequent bowel movement that can be hard dry or involve straining).  Recommend trying OTC Miralax 17g = 1 capful in large glass water once daily for now, try several days to see if working, goal is soft stool or BM 1-2 times daily, if too loose then reduce dose or try every other day. If not effective may need to increase it to 2 doses at once in AM or may do 1 in morning and 1 in afternoon/evening  - This medicine is very safe and can be used often without any problem and will not make you dehydrated. It is good for use on AS NEEDED BASIS or even MAINTENANCE therapy for longer term for several days to weeks at a time to help regulate bowel movements  Other more natural remedies or preventative treatment: - Increase hydration with water - Increase fiber in diet (high fiber foods = vegetables, leafy greens, oats/grains) - May take OTC Fiber supplement (metamucil powder or pill/gummy) - May try OTC Probiotic   DUE for FASTING BLOOD WORK (no food or drink after midnight before the lab appointment, only water or coffee without cream/sugar on the morning of)  SCHEDULE "Lab Only" visit in the morning at the clinic for lab draw in 6 MONTHS   - Make sure Lab Only appointment is at about 1 week before your next appointment, so that results will be available  For Lab Results, once available within 2-3 days of blood draw, you can can log in to MyChart online to view your results and a brief explanation. Also, we can discuss results at next follow-up visit.   Please schedule a Follow-up Appointment to: Return in about 6 months (around  11/22/2022) for 6 month fasting lab only then 1 week later Annual Physical.  If you have any other questions or concerns, please feel free to call the office or send a message through [place]. You may also schedule an earlier appointment if necessary.  Additionally, you may be receiving a survey about your experience at our office within a few days to 1 week by e-mail or mail. We value your feedback.  Nobie Putnam, DO [place]

## 2022-05-23 NOTE — Assessment & Plan Note (Signed)
Followed by [facility] Managed for prostate cancer, prior treatment/radiation On Medication management Last PSA now < 0.01 significant improvement

## 2022-05-23 NOTE — Assessment & Plan Note (Signed)
A1c up to 7.9 Improved on GLP1 but some side effects Admits some issue with prostate cancer treatment impacting his sugar Complications - hyperglycemia, hyperlipidemia - increases risk of future cardiovascular complications  - Failed Jardiance d/t yeast infection, off in 2020  Plan:  1. Continue Ozempic '1mg'$  weekly, Continue Metformin '500mg'$  nightly 3. Encourage improved lifestyle - low carb, low sugar diet, reduce portion size, continue improving regular exercise 4. Check CBG , bring log to next visit for review 5. Continue ASA, ACEi, statin DM Foot

## 2022-05-23 NOTE — Assessment & Plan Note (Signed)
Controlled HTN - Home BP readings reviewed, normal  No known complications    Plan:  1. Continue current BP regimen -  Lisinopril-HCTZ 10-12.'5mg'$  (HALF pill) daily refill 2. Encourage improved lifestyle - low sodium diet, regular exercise 3. Continue monitor BP outside office, bring readings to next visit, if persistently >140/90 or new symptoms notify office sooner

## 2022-05-24 ENCOUNTER — Other Ambulatory Visit: Payer: Self-pay | Admitting: Family Medicine

## 2022-05-24 DIAGNOSIS — E559 Vitamin D deficiency, unspecified: Secondary | ICD-10-CM

## 2022-05-24 DIAGNOSIS — E66811 Obesity, class 1: Secondary | ICD-10-CM

## 2022-05-24 DIAGNOSIS — I10 Essential (primary) hypertension: Secondary | ICD-10-CM

## 2022-05-24 DIAGNOSIS — E1169 Type 2 diabetes mellitus with other specified complication: Secondary | ICD-10-CM

## 2022-05-24 DIAGNOSIS — E669 Obesity, unspecified: Secondary | ICD-10-CM

## 2022-05-24 DIAGNOSIS — C61 Malignant neoplasm of prostate: Secondary | ICD-10-CM

## 2022-05-24 DIAGNOSIS — Z0000 Encounter for general adult medical examination without abnormal findings: Secondary | ICD-10-CM

## 2022-06-17 DIAGNOSIS — C61 Malignant neoplasm of prostate: Secondary | ICD-10-CM | POA: Diagnosis not present

## 2022-06-17 DIAGNOSIS — M2550 Pain in unspecified joint: Secondary | ICD-10-CM | POA: Diagnosis not present

## 2022-07-26 DIAGNOSIS — M2550 Pain in unspecified joint: Secondary | ICD-10-CM | POA: Diagnosis not present

## 2022-07-26 DIAGNOSIS — C61 Malignant neoplasm of prostate: Secondary | ICD-10-CM | POA: Diagnosis not present

## 2022-08-23 ENCOUNTER — Telehealth: Payer: Self-pay | Admitting: *Deleted

## 2022-08-23 NOTE — Patient Instructions (Signed)
Visit Information  Thank you for taking time to visit with me today. Please don't hesitate to contact me if I can be of assistance to you.  Following are the goals we discussed today:  Continue monitoring blood sugars daily. Follow diabetic diet, exercise, and drink plenty of water to decrease A1C.  Please call the Suicide and Crisis Lifeline: 988 call the [place] National Suicide Prevention Lifeline: 315-248-5286 or TTY: 217-010-3571 TTY 954-845-0337) to talk to a trained counselor call 1-800-273-TALK (toll free, 24 hour hotline) call 911 if you are experiencing a Mental Health or [place] or need someone to talk to.  Patient verbalizes understanding of instructions and care plan provided today and agrees to view in [place]. Active MyChart status and patient understanding of how to access instructions and care plan via MyChart confirmed with patient.     The patient has been provided with contact information for the care management team and has been advised to call with any health related questions or concerns.   Valente David, RN, MSN, [place] Care Management Care Management Coordinator [phone]

## 2022-08-23 NOTE — Patient Outreach (Signed)
  Care Coordination   Initial Visit Note   08/23/2022 Name: Duane Grant MRN: [id number] DOB: 10-24-1955  Duane Grant is a 67 y.o. year old male who sees Duane Hauser, DO for primary care. I spoke with  Duane Grant by phone today.  What matters to the patients health and wellness today?  Staying healthy/active, keeping DM and HTN controlled.  Does not agree to ongoing calls at this time but agrees to contact this RNCM with question.     Goals Addressed             This Visit's Progress    COMPLETED: Care Coordination Activities - No follow up needed       Care Coordination Interventions: Provided education to patient about basic DM disease process Reviewed medications with patient and discussed importance of medication adherence Counseled on importance of regular laboratory monitoring as prescribed Reviewed scheduled/upcoming provider appointments including: PCP follow up on 4/15 with labs on 4/8 Advised patient, providing education and rationale, to check cbg daily and record, calling PCP for findings outside established parameters Screening for signs and symptoms of depression related to chronic disease state  Assessed social determinant of health barriers A1C reviewed, currently 7.9, state provider is satisfied with this reading.  Discussed adhering to diabetic diet and exercise to decrease A1C to less than 7         SDOH assessments and interventions completed:  Yes  SDOH Interventions Today    Flowsheet Row Most Recent Value  SDOH Interventions   Food Insecurity Interventions Intervention Not Indicated  Housing Interventions Intervention Not Indicated  Transportation Interventions Intervention Not Indicated        Care Coordination Interventions:  Yes, provided   Follow up plan: No further intervention required.   Encounter Outcome:  Pt. Visit Completed   Duane David, RN, MSN, [place] Care Management Care Management  Coordinator [phone]

## 2022-09-05 DIAGNOSIS — Z8546 Personal history of malignant neoplasm of prostate: Secondary | ICD-10-CM | POA: Diagnosis not present

## 2022-09-05 DIAGNOSIS — I10 Essential (primary) hypertension: Secondary | ICD-10-CM | POA: Diagnosis not present

## 2022-09-05 DIAGNOSIS — R0602 Shortness of breath: Secondary | ICD-10-CM | POA: Diagnosis not present

## 2022-09-05 DIAGNOSIS — R931 Abnormal findings on diagnostic imaging of heart and coronary circulation: Secondary | ICD-10-CM | POA: Diagnosis not present

## 2022-09-05 DIAGNOSIS — R079 Chest pain, unspecified: Secondary | ICD-10-CM | POA: Diagnosis not present

## 2022-09-20 DIAGNOSIS — M25551 Pain in right hip: Secondary | ICD-10-CM | POA: Diagnosis not present

## 2022-09-20 DIAGNOSIS — Z87891 Personal history of nicotine dependence: Secondary | ICD-10-CM | POA: Diagnosis not present

## 2022-09-20 DIAGNOSIS — M8589 Other specified disorders of bone density and structure, multiple sites: Secondary | ICD-10-CM | POA: Diagnosis not present

## 2022-09-20 DIAGNOSIS — C61 Malignant neoplasm of prostate: Secondary | ICD-10-CM | POA: Diagnosis not present

## 2022-09-20 DIAGNOSIS — R232 Flushing: Secondary | ICD-10-CM | POA: Diagnosis not present

## 2022-09-20 DIAGNOSIS — Z79818 Long term (current) use of other agents affecting estrogen receptors and estrogen levels: Secondary | ICD-10-CM | POA: Diagnosis not present

## 2022-10-03 ENCOUNTER — Telehealth: Payer: Self-pay | Admitting: Family Medicine

## 2022-10-03 NOTE — Telephone Encounter (Signed)
Patient is receiving Ozempic, and wants to know if [employer] [place] has contacted Dr. Parks Ranger to approve the medication. Please follow up with patient.

## 2022-10-06 NOTE — Telephone Encounter (Signed)
No, nothing has been received from his insurance.

## 2022-10-14 DIAGNOSIS — M1611 Unilateral primary osteoarthritis, right hip: Secondary | ICD-10-CM | POA: Diagnosis not present

## 2022-10-31 DIAGNOSIS — M25551 Pain in right hip: Secondary | ICD-10-CM | POA: Diagnosis not present

## 2022-10-31 DIAGNOSIS — E119 Type 2 diabetes mellitus without complications: Secondary | ICD-10-CM | POA: Diagnosis not present

## 2022-10-31 DIAGNOSIS — M1611 Unilateral primary osteoarthritis, right hip: Secondary | ICD-10-CM | POA: Diagnosis not present

## 2022-11-14 DIAGNOSIS — M1611 Unilateral primary osteoarthritis, right hip: Secondary | ICD-10-CM | POA: Diagnosis not present

## 2022-11-18 ENCOUNTER — Other Ambulatory Visit: Payer: Self-pay

## 2022-11-18 DIAGNOSIS — Z0000 Encounter for general adult medical examination without abnormal findings: Secondary | ICD-10-CM

## 2022-11-18 DIAGNOSIS — E1169 Type 2 diabetes mellitus with other specified complication: Secondary | ICD-10-CM

## 2022-11-18 DIAGNOSIS — E669 Obesity, unspecified: Secondary | ICD-10-CM

## 2022-11-18 DIAGNOSIS — E559 Vitamin D deficiency, unspecified: Secondary | ICD-10-CM

## 2022-11-18 DIAGNOSIS — I10 Essential (primary) hypertension: Secondary | ICD-10-CM

## 2022-11-21 ENCOUNTER — Other Ambulatory Visit: Payer: BC Managed Care – PPO

## 2022-11-21 DIAGNOSIS — E669 Obesity, unspecified: Secondary | ICD-10-CM | POA: Diagnosis not present

## 2022-11-21 DIAGNOSIS — E559 Vitamin D deficiency, unspecified: Secondary | ICD-10-CM | POA: Diagnosis not present

## 2022-11-21 DIAGNOSIS — Z0000 Encounter for general adult medical examination without abnormal findings: Secondary | ICD-10-CM | POA: Diagnosis not present

## 2022-11-21 DIAGNOSIS — E1169 Type 2 diabetes mellitus with other specified complication: Secondary | ICD-10-CM | POA: Diagnosis not present

## 2022-11-21 DIAGNOSIS — I10 Essential (primary) hypertension: Secondary | ICD-10-CM | POA: Diagnosis not present

## 2022-11-21 DIAGNOSIS — E785 Hyperlipidemia, unspecified: Secondary | ICD-10-CM | POA: Diagnosis not present

## 2022-11-21 LAB — COMPLETE METABOLIC PANEL WITH GFR
AG Ratio: 1.8 (calc) (ref 1.0–2.5)
ALT: 19 U/L (ref 9–46)
AST: 16 U/L (ref 10–35)
Albumin: 4.1 g/dL (ref 3.6–5.1)
CO2: 27 mmol/L (ref 20–32)
Globulin: 2.3 g/dL (calc) (ref 1.9–3.7)
Glucose, Bld: 189 mg/dL — ABNORMAL HIGH (ref 65–99)
Potassium: 4.3 mmol/L (ref 3.5–5.3)
Total Bilirubin: 0.7 mg/dL (ref 0.2–1.2)
Total Protein: 6.4 g/dL (ref 6.1–8.1)
eGFR: 94 mL/min/{1.73_m2} (ref >=60)

## 2022-11-22 LAB — CBC WITH DIFFERENTIAL/PLATELET
Absolute Monocytes: 413 cells/uL (ref 200–950)
Basophils Absolute: 29 cells/uL (ref 0–200)
Basophils Relative: 0.6 %
Eosinophils Absolute: 91 cells/uL (ref 15–500)
Eosinophils Relative: 1.9 %
HCT: 37.4 % — ABNORMAL LOW (ref 38.5–50.0)
Hemoglobin: 12.7 g/dL — ABNORMAL LOW (ref 13.2–17.1)
Lymphs Abs: 1051 cells/uL (ref 850–3900)
MCH: 32.9 pg (ref 27.0–33.0)
MCHC: 34 g/dL (ref 32.0–36.0)
MCV: 96.9 fL (ref 80.0–100.0)
MPV: 9.8 fL (ref 7.5–12.5)
Monocytes Relative: 8.6 %
Neutro Abs: 3216 cells/uL (ref 1500–7800)
Neutrophils Relative %: 67 %
Platelets: 250 10*3/uL (ref 140–400)
RBC: 3.86 10*6/uL — ABNORMAL LOW (ref 4.20–5.80)
RDW: 11.8 % (ref 11.0–15.0)
Total Lymphocyte: 21.9 %
WBC: 4.8 10*3/uL (ref 3.8–10.8)

## 2022-11-22 LAB — HEMOGLOBIN A1C
Hgb A1c MFr Bld: 7.7 % of total Hgb — ABNORMAL HIGH (ref <5.7)
Mean Plasma Glucose: 174 mg/dL
eAG (mmol/L): 9.7 mmol/L

## 2022-11-22 LAB — COMPLETE METABOLIC PANEL WITH GFR
Alkaline phosphatase (APISO): 38 U/L (ref 35–144)
BUN: 12 mg/dL (ref 7–25)
Calcium: 9.1 mg/dL (ref 8.6–10.3)
Chloride: 103 mmol/L (ref 98–110)
Creat: 0.86 mg/dL (ref 0.70–1.35)
Sodium: 137 mmol/L (ref 135–146)

## 2022-11-22 LAB — TSH: TSH: 1.11 mIU/L (ref 0.40–4.50)

## 2022-11-22 LAB — VITAMIN D 25 HYDROXY (VIT D DEFICIENCY, FRACTURES): Vit D, 25-Hydroxy: 33 ng/mL (ref 30–100)

## 2022-11-22 LAB — LIPID PANEL
Cholesterol: 172 mg/dL (ref <200)
HDL: 57 mg/dL (ref >=40)
LDL Cholesterol (Calc): 89 mg/dL (calc)
Non-HDL Cholesterol (Calc): 115 mg/dL (calc) (ref <130)
Total CHOL/HDL Ratio: 3 (calc) (ref <5.0)
Triglycerides: 166 mg/dL — ABNORMAL HIGH (ref <150)

## 2022-11-28 ENCOUNTER — Ambulatory Visit (INDEPENDENT_AMBULATORY_CARE_PROVIDER_SITE_OTHER): Payer: BC Managed Care – PPO | Admitting: Family Medicine

## 2022-11-28 ENCOUNTER — Encounter: Payer: Self-pay | Admitting: Family Medicine

## 2022-11-28 VITALS — BP 122/74 | HR 84 | Ht 68.0 in | Wt 214.0 lb

## 2022-11-28 DIAGNOSIS — C61 Malignant neoplasm of prostate: Secondary | ICD-10-CM | POA: Diagnosis not present

## 2022-11-28 DIAGNOSIS — I10 Essential (primary) hypertension: Secondary | ICD-10-CM | POA: Diagnosis not present

## 2022-11-28 DIAGNOSIS — Z0000 Encounter for general adult medical examination without abnormal findings: Secondary | ICD-10-CM | POA: Diagnosis not present

## 2022-11-28 DIAGNOSIS — E785 Hyperlipidemia, unspecified: Secondary | ICD-10-CM | POA: Diagnosis not present

## 2022-11-28 DIAGNOSIS — E559 Vitamin D deficiency, unspecified: Secondary | ICD-10-CM | POA: Diagnosis not present

## 2022-11-28 DIAGNOSIS — E1169 Type 2 diabetes mellitus with other specified complication: Secondary | ICD-10-CM | POA: Diagnosis not present

## 2022-11-28 DIAGNOSIS — Z1211 Encounter for screening for malignant neoplasm of colon: Secondary | ICD-10-CM | POA: Diagnosis not present

## 2022-11-28 DIAGNOSIS — N401 Enlarged prostate with lower urinary tract symptoms: Secondary | ICD-10-CM

## 2022-11-28 MED ORDER — PRAVASTATIN SODIUM 10 MG PO TABS: 10.0000 mg | ORAL_TABLET | Freq: Every day | ORAL | 3 refills | Status: DC

## 2022-11-28 NOTE — Assessment & Plan Note (Signed)
A1c down to 7.7 Improved on GLP1 but some side effects Admits some issue with prostate cancer treatment impacting his sugar Complications - hyperglycemia, hyperlipidemia - increases risk of future cardiovascular complications  - Failed Jardiance d/t yeast infection, off in 2020  Plan:  1. Continue Ozempic 1mg  weekly, Continue Metformin 500mg  nightly 3. Encourage improved lifestyle - low carb, low sugar diet, reduce portion size, continue improving regular exercise 4. Check CBG , bring log to next visit for review 5. Continue ASA, ACEi, statin

## 2022-11-28 NOTE — Assessment & Plan Note (Signed)
Followed by [facility] Managed for prostate cancer, prior treatment/radiation On Medication management Last PSA now < 0.01 significant improvement  

## 2022-11-28 NOTE — Assessment & Plan Note (Signed)
Controlled on statin Tolerating pravastatin, prior drug induced myopathy however now some myalgia, we discussed temporary pause statin 1-2 week to see if side effect The 10-year ASCVD risk score (Arnett DK, et al., 2019) is: 27.7%  Plan: 1. Continue rx Pravastatin 10mg  nightly - but trial 1-2 week pause as stated.  2. Encourage improved lifestyle - low carb/cholesterol, reduce portion size, continue improving regular exercise

## 2022-11-28 NOTE — Progress Notes (Signed)
Subjective:    Patient ID: Duane Grant, male    DOB: 25-Mar-1956, 67 y.o.   MRN: [id number]  Duane Grant is a 67 y.o. male presenting on 11/28/2022 for Annual Exam   HPI  Here for Annual Physical and Lab Review.   CHRONIC DM, Type 2: Hyperlipidemia   Improved cholesterol on last lab now on Pravastatin A1c 7.7 improved from 7.9 Meds: Metformin  nightly with meals, Ozempic  weekly inj Reports good compliance. 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   Statin Myalgia Drug induced myopathy Hyperlipidemia Last lipid panel controlled Previously on Rosuvastatin  - had stopped this due to myalgia Continues on Pravastatin  with improvement.  Question if muscle aching is due to arthritis joints or statin or other such as Nubeqa   CHRONIC HTN: BP controlled Current Meds - Lisinopril-HCTZ 10-12.5mg  (HALF pill) daily  - has refills Reports good compliance, took meds today. Tolerating well, w/o complaints. Denies CP, dyspnea, HA, edema, dizziness / lightheadedness   Left Shoulder Pain, limited ROM He is still driving regularly.    Trigger Fingers Bilateral ring fingers, with catching triggering stiffness episodic. Bilateral ring fingers   Prostate Cancer Followed by [facility] - Dr Lottie Dawson - S/p Radiation 2010, PSA was up to 19 in 08/2019, then treated with Trelstar (ADT) Currently taking Nubeqa, he has some side effects w chest pain and already evaluated by Cardiology he takes OTC Black Cohosh for hormonal symptoms. Still followed by [facility] On Flomax Last lab showed PSA 0     Chronic Low Back Pain he got 2nd opinion with [facility], saw Altamese Cabal PA, has had Lumbar MRI - PT  regimen in past. - On methocarbamol, has plenty of med, takes AS NEEDED  Upcoming weeks has apt with Orthopedic Hip Surgeon consultation  Wife concerned with the duration of his working / hours. He is starting to scale back to 5 days per week instead of 6 days a week. - Taking Methocarbamol muscle relaxant AS NEEDED - Taking Meloxicam AS NEEDED - Also on Tylenol PRN   History of Recurrent Genital HSV No flare up recently. On Valtrex PRN only.    Reduced Hearing, Left ear Reports chronic gradual decline in hearing, Left ear is worse. He would like a hearing test.    Health Maintenance: UTD COVID19 vaccine 11/2019 and 12/2019 Pfizer completed   Shingrix vaccine updated.   Colon CA Screening: Never had colonoscopy. Cologuard negative 11/19/19 - next due 11/2022 (3 yr) - ordered today     11/28/2022    8:07 AM 05/23/2022    8:16 AM 12/30/2021    8:52 AM  Depression screen PHQ 2/9  Decreased Interest 0 0 0  Down, Depressed, Hopeless 0 0 0  PHQ - 2 Score 0 0 0  Altered sleeping 0 0 3  Tired, decreased energy 0 0 0  Change in appetite 0 0 0  Feeling bad or failure about yourself  0 0 0  Trouble concentrating 0 0 0  Moving slowly or fidgety/restless 0 0 0  Suicidal thoughts 0 0 0  PHQ-9 Score 0 0 3  Difficult doing work/chores  Not difficult at all Not difficult at all    Past Medical History:  Diagnosis  Date   Arthritis    Cancer 2009   Prostate, radiation but no surgery   Elevated PSA    H/O removal of cyst 12/02/2019   back    History of prostate cancer    Hyperlipidemia    Hypertension    Past Surgical History:  Procedure Laterality Date   HERNIA REPAIR  2010   Umbilical   HERNIA REPAIR  2010   Removal Fatty Tumor Right 2015   [facility], right side of chin/jaw   TOOTH EXTRACTION  12/15/2020   TOTAL HIP ARTHROPLASTY Left 06/09/2016   Procedure: TOTAL HIP ARTHROPLASTY;  Surgeon: Juanell Fairly, MD;  Location: [facility] ORS;  Service: Orthopedics;   Laterality: Left;   Social History   Socioeconomic History   Marital status: Married    Spouse name: Olive Motyka   Number of children: 3   Years of education: High School   Highest education level: High school graduate  Occupational History   Occupation: Truck Hospital doctor  Tobacco Use   Smoking status: Former    Packs/day: 0.50    Years: 35.00    Additional pack years: 0.00    Total pack years: 17.50    Types: Cigarettes    Quit date: 11/14/1994    Years since quitting: 28.0   Smokeless tobacco: Former  Building services engineer Use: Never used  Substance and Sexual Activity   Alcohol use: Yes    Comment: occassional   Drug use: No   Sexual activity: Yes    Birth control/protection: None  Other Topics Concern   Not on file  Social History Narrative   Not on file   Social Determinants of Health   Financial Resource Strain: Low Risk  (12/30/2021)   Overall Financial Resource Strain (CARDIA)    Difficulty of Paying Living Expenses: Not hard at all  Food Insecurity: No Food Insecurity (08/23/2022)   Hunger Vital Sign    Worried About Running Out of Food in the Last Year: Never true    Ran Out of Food in the Last Year: Never true  Transportation Needs: No Transportation Needs (08/23/2022)   PRAPARE - Administrator, Civil Service (Medical): No    Lack of Transportation (Non-Medical): No  Physical Activity: Sufficiently Active (12/30/2021)   Exercise Vital Sign    Days of Exercise per Week: 5 days    Minutes of Exercise per Session: 30 min  Stress: No Stress Concern Present (12/30/2021)   [employer] of Occupational Health - Occupational Stress Questionnaire    Feeling of Stress : Not at all  Social Connections: Socially Integrated (12/30/2021)   Social Connection and Isolation Panel [NHANES]    Frequency of Communication with Friends and Family: More than three times a week    Frequency of Social Gatherings with Friends and Family: More than three times a week     Attends Religious Services: More than 4 times per year    Active Member of [employer] or Organizations: Yes    Attends Banker Meetings: More than 4 times per year    Marital Status: Married  Catering manager Violence: Not At Risk (12/30/2021)   Humiliation, Afraid, Rape, and Kick questionnaire    Fear of Current or Ex-Partner: No    Emotionally Abused: No    Physically Abused: No    Sexually Abused: No   Family History  Problem Relation Age of Onset   Diabetes Mother    Hypertension Mother  Current Outpatient Medications on File Prior to Visit  Medication Sig   aspirin EC 81 MG tablet Take 81 mg by mouth daily. Swallow whole.   lisinopril-hydrochlorothiazide (ZESTORETIC) 10-12.5 MG tablet Take 0.5 tablets by mouth daily.   meloxicam (MOBIC) 7.5 MG tablet Take 7.5 mg by mouth daily as needed for pain.   metFORMIN (GLUCOPHAGE) 500 MG tablet Take 1 tablet (500 mg total) by mouth daily with supper.   methocarbamol (ROBAXIN) 500 MG tablet Take 500 mg by mouth 4 (four) times daily.   Omega-3 Fatty Acids (FISH OIL) 1200 MG CAPS Take 1 capsule by mouth 2 (two) times daily.   OZEMPIC, 1 MG/DOSE, 4 MG/3ML SOPN Inject 1 mg as directed once a week.   tamsulosin (FLOMAX) 0.4 MG CAPS capsule Take 1 capsule (0.4 mg total) by mouth daily after supper.   NUBEQA 300 MG tablet Take 600 mg by mouth 2 (two) times daily.   valACYclovir (VALTREX) 1000 MG tablet Take 1 tablet (1,000 mg total) by mouth daily as needed (HSV flare). For up to 5-7 as needed for flare, can repeat. (Patient not taking: Reported on 11/28/2022)   No current facility-administered medications on file prior to visit.    Review of Systems  Constitutional:  Negative for activity change, appetite change, chills, diaphoresis, fatigue and fever.  HENT:  Negative for congestion and hearing loss.   Eyes:  Negative for visual disturbance.  Respiratory:  Negative for cough, chest tightness, shortness of breath and wheezing.    Cardiovascular:  Negative for chest pain, palpitations and leg swelling.  Gastrointestinal:  Negative for abdominal pain, constipation, diarrhea, nausea and vomiting.  Genitourinary:  Negative for dysuria, frequency and hematuria.  Musculoskeletal:  Positive for arthralgias and back pain. Negative for neck pain.  Skin:  Negative for rash.  Neurological:  Negative for dizziness, weakness, light-headedness, numbness and headaches.  Hematological:  Negative for adenopathy.  Psychiatric/Behavioral:  Negative for behavioral problems, dysphoric mood and sleep disturbance.    Per HPI unless specifically indicated above      Objective:    BP 122/74   Pulse 84   Ht 5\' 8"  (1.727 m)   Wt 214 lb (97.1 kg)   BMI 32.54 kg/m   Wt Readings from Last 3 Encounters:  11/28/22 214 lb (97.1 kg)  05/23/22 209 lb (94.8 kg)  11/15/21 211 lb 12.8 oz (96.1 kg)    Physical Exam Vitals and nursing note reviewed.  Constitutional:      General: He is not in acute distress.    Appearance: He is well-developed. He is not diaphoretic.     Comments: Well-appearing, comfortable, cooperative  HENT:     Head: Normocephalic and atraumatic.  Eyes:     General:        Right eye: No discharge.        Left eye: No discharge.     Conjunctiva/sclera: Conjunctivae normal.     Pupils: Pupils are equal, round, and reactive to light.  Neck:     Thyroid: No thyromegaly.     Vascular: No carotid bruit.  Cardiovascular:     Rate and Rhythm: Normal rate and regular rhythm.     Pulses: Normal pulses.     Heart sounds: Normal heart sounds. No murmur heard. Pulmonary:     Effort: Pulmonary effort is normal. No respiratory distress.     Breath sounds: Normal breath sounds. No wheezing or rales.  Abdominal:     General: Bowel sounds are normal. There is  no distension.     Palpations: Abdomen is soft. There is no mass.     Tenderness: There is no abdominal tenderness.  Musculoskeletal:        General: No tenderness.  Normal range of motion.     Cervical back: Normal range of motion and neck supple.     Left lower leg: No edema.     Comments: Upper / Lower Extremities: - Normal muscle tone, strength bilateral upper extremities 5/5, lower extremities 5/5  Lymphadenopathy:     Cervical: No cervical adenopathy.  Skin:    General: Skin is warm and dry.     Findings: No erythema or rash.  Neurological:     Mental Status: He is alert and oriented to person, place, and time.     Comments: Distal sensation intact to light touch all extremities  Psychiatric:        Mood and Affect: Mood normal.        Behavior: Behavior normal.        Thought Content: Thought content normal.     Comments: Well groomed, good eye contact, normal speech and thoughts      Results for orders placed or performed in visit on 11/18/22  TSH  Result Value Ref Range   TSH 1.11 0.40 - 4.50 mIU/L  VITAMIN D 25 Hydroxy (Vit-D Deficiency, Fractures)  Result Value Ref Range   Vit D, 25-Hydroxy 33 30 - 100 ng/mL  Hemoglobin A1c  Result Value Ref Range   Hgb A1c MFr Bld 7.7 (H) <5.7 % of total Hgb   Mean Plasma Glucose 174 mg/dL   eAG (mmol/L) 9.7 mmol/L  Lipid panel  Result Value Ref Range   Cholesterol 172 <200 mg/dL   HDL 57 > OR = 40 mg/dL   Triglycerides 330 (H) <150 mg/dL   LDL Cholesterol (Calc) 89 mg/dL (calc)   Total CHOL/HDL Ratio 3.0 <5.0 (calc)   Non-HDL Cholesterol (Calc) 115 <130 mg/dL (calc)  CBC with Differential/Platelet  Result Value Ref Range   WBC 4.8 3.8 - 10.8 Thousand/uL   RBC 3.86 (L) 4.20 - 5.80 Million/uL   Hemoglobin 12.7 (L) 13.2 - 17.1 g/dL   HCT 07.6 (L) 22.6 - 33.3 %   MCV 96.9 80.0 - 100.0 fL   MCH 32.9 27.0 - 33.0 pg   MCHC 34.0 32.0 - 36.0 g/dL   RDW 54.5 62.5 - 63.8 %   Platelets 250 140 - 400 Thousand/uL   MPV 9.8 7.5 - 12.5 fL   Neutro Abs 3,216 1,500 - 7,800 cells/uL   Lymphs Abs 1,051 850 - 3,900 cells/uL   Absolute Monocytes 413 200 - 950 cells/uL   Eosinophils Absolute 91 15 -  500 cells/uL   Basophils Absolute 29 0 - 200 cells/uL   Neutrophils Relative % 67 %   Total Lymphocyte 21.9 %   Monocytes Relative 8.6 %   Eosinophils Relative 1.9 %   Basophils Relative 0.6 %  COMPLETE METABOLIC PANEL WITH GFR  Result Value Ref Range   Glucose, Bld 189 (H) 65 - 99 mg/dL   BUN 12 7 - 25 mg/dL   Creat 9.37 3.42 - 8.76 mg/dL   eGFR 94 > OR = 60 OT/LXB/2.62M3   BUN/Creatinine Ratio SEE NOTE: 6 - 22 (calc)   Sodium 137 135 - 146 mmol/L   Potassium 4.3 3.5 - 5.3 mmol/L   Chloride 103 98 - 110 mmol/L   CO2 27 20 - 32 mmol/L   Calcium 9.1 8.6 -  10.3 mg/dL   Total Protein 6.4 6.1 - 8.1 g/dL   Albumin 4.1 3.6 - 5.1 g/dL   Globulin 2.3 1.9 - 3.7 g/dL (calc)   AG Ratio 1.8 1.0 - 2.5 (calc)   Total Bilirubin 0.7 0.2 - 1.2 mg/dL   Alkaline phosphatase (APISO) 38 35 - 144 U/L   AST 16 10 - 35 U/L   ALT 19 9 - 46 U/L      Assessment & Plan:   Problem List Items Addressed This Visit     Essential hypertension    Controlled HTN - Home BP readings reviewed, normal  No known complications    Plan:  1. Continue current BP regimen -  Lisinopril-HCTZ 10-12.5mg  (HALF pill) daily refill 2. Encourage improved lifestyle - low sodium diet, regular exercise 3. Continue monitor BP outside office, bring readings to next visit, if persistently >140/90 or new symptoms notify office sooner      Relevant Medications   aspirin EC 81 MG tablet   pravastatin (PRAVACHOL) 10 MG tablet   Hyperlipidemia associated with type 2 diabetes mellitus    Controlled on statin Tolerating pravastatin, prior drug induced myopathy however now some myalgia, we discussed temporary pause statin 1-2 week to see if side effect The 10-year ASCVD risk score (Arnett DK, et al., 2019) is: 27.7%  Plan: 1. Continue rx Pravastatin 10mg  nightly - but trial 1-2 week pause as stated.  2. Encourage improved lifestyle - low carb/cholesterol, reduce portion size, continue improving regular exercise      Relevant  Medications   aspirin EC 81 MG tablet   pravastatin (PRAVACHOL) 10 MG tablet   Prostate cancer    Followed by [facility] Managed for prostate cancer, prior treatment/radiation On Medication management Last PSA now < 0.01 significant improvement       Relevant Medications   NUBEQA 300 MG tablet   aspirin EC 81 MG tablet   Type 2 diabetes mellitus with other specified complication    A1c down to 7.7 Improved on GLP1 but some side effects Admits some issue with prostate cancer treatment impacting his sugar Complications - hyperglycemia, hyperlipidemia - increases risk of future cardiovascular complications  - Failed Jardiance d/t yeast infection, off in 2020  Plan:  1. Continue Ozempic 1mg  weekly, Continue Metformin 500mg  nightly 3. Encourage improved lifestyle - low carb, low sugar diet, reduce portion size, continue improving regular exercise 4. Check CBG , bring log to next visit for review 5. Continue ASA, ACEi, statin      Relevant Medications   aspirin EC 81 MG tablet   pravastatin (PRAVACHOL) 10 MG tablet   Other Relevant Orders   Urine Microalbumin w/creat. ratio   Other Visit Diagnoses     Annual physical exam    -  Primary   Vitamin D deficiency       Benign prostatic hyperplasia with lower urinary tract symptoms, symptom details unspecified       Screening for colon cancer       Relevant Orders   Cologuard       Updated Health Maintenance information Reviewed recent lab results with patient Encouraged improvement to lifestyle with diet and exercise Goal of weight loss  Cologuard ordered.  Myalgia / muscle aches Discussion today, unsure if this is due to working / physical exertion or if it is secondary side effect Pause the Pravastatin for 1-2 weeks to see if this could cause this. May need to ask your Urologist next on the [place] as  well. Or do the Orthopedic Consultation.  Orders Placed This Encounter  Procedures   Urine Microalbumin  w/creat. ratio   Cologuard     Meds ordered this encounter  Medications   pravastatin (PRAVACHOL) 10 MG tablet    Sig: Take 1 tablet (10 mg total) by mouth daily.    Dispense:  90 tablet    Refill:  3      Follow up plan: Return in about 6 months (around 05/30/2023) for 6 month DM A1c, updates from specialists.  Saralyn Pilar, DO [facility] 11/28/2022, 8:17 AM

## 2022-11-28 NOTE — Patient Instructions (Addendum)
Thank you for coming to the office today.  Recent Labs    05/23/22 0814 11/21/22 0832  HGBA1C 7.9* 7.7*   Cholesterol controlled  Lipid Panel     Component Value Date/Time   CHOL 172 11/21/2022 0832   TRIG 166 (H) 11/21/2022 0832   HDL 57 11/21/2022 0832   CHOLHDL 3.0 11/21/2022 0832   LDLCALC 89 11/21/2022 0832   Pause the Pravastatin for 1-2 weeks to see if this could cause this.  May need to ask your Urologist next on the Nubeqa as well. Or do the Orthopedic Consultation.  Please schedule a Follow-up Appointment to: Return in about 6 months (around 05/30/2023) for 6 month DM A1c, updates from specialists.  If you have any other questions or concerns, please feel free to call the office or send a message through MyChart. You may also schedule an earlier appointment if necessary.  Additionally, you may be receiving a survey about your experience at our office within a few days to 1 week by e-mail or mail. We value your feedback.  Saralyn Pilar, DO [facility], [place]

## 2022-11-28 NOTE — Assessment & Plan Note (Signed)
Controlled HTN - Home BP readings reviewed, normal  No known complications    Plan:  1. Continue current BP regimen -  Lisinopril-HCTZ 10-12.5mg (HALF pill) daily refill 2. Encourage improved lifestyle - low sodium diet, regular exercise 3. Continue monitor BP outside office, bring readings to next visit, if persistently >140/90 or new symptoms notify office sooner 

## 2022-11-29 LAB — MICROALBUMIN / CREATININE URINE RATIO
Creatinine, Urine: 15 mg/dL — ABNORMAL LOW (ref 20–320)
Microalb, Ur: <0.2 mg/dL

## 2022-12-01 ENCOUNTER — Telehealth: Payer: Self-pay

## 2022-12-01 NOTE — Telephone Encounter (Signed)
Please let patient know.  Urine test 11/28/22 result is normal. No evidence of kidney decline.  Thanks  Saralyn Pilar, DO [facility] 12/01/2022, 2:18 PM

## 2022-12-01 NOTE — Telephone Encounter (Signed)
Left message informing patient of results. 

## 2022-12-01 NOTE — Telephone Encounter (Signed)
Copied from CRM [id number]. Topic: General - Inquiry >> Dec 01, 2022  1:59 PM De Blanch wrote: Reason for CRM: Pt is calling to f/u on the urine test done on 04/15.  Pt is requesting a callback.  Please advise.

## 2022-12-03 DIAGNOSIS — Z1211 Encounter for screening for malignant neoplasm of colon: Secondary | ICD-10-CM | POA: Diagnosis not present

## 2022-12-09 ENCOUNTER — Telehealth: Payer: Self-pay | Admitting: Family Medicine

## 2022-12-09 NOTE — Telephone Encounter (Signed)
Contacted Duane Grant to schedule their annual wellness visit. Patient Duane Grant works 8-5p;   Duane Grant; Care Guide Ambulatory Clinical Support [place] [facility] Direct Dial: [phone]

## 2022-12-12 ENCOUNTER — Other Ambulatory Visit: Payer: Self-pay | Admitting: Family Medicine

## 2022-12-12 DIAGNOSIS — R195 Other fecal abnormalities: Secondary | ICD-10-CM

## 2022-12-12 LAB — COLOGUARD: COLOGUARD: POSITIVE — AB

## 2022-12-13 ENCOUNTER — Other Ambulatory Visit: Payer: Self-pay

## 2022-12-13 ENCOUNTER — Telehealth: Payer: Self-pay

## 2022-12-13 DIAGNOSIS — Z1211 Encounter for screening for malignant neoplasm of colon: Secondary | ICD-10-CM

## 2022-12-13 DIAGNOSIS — R195 Other fecal abnormalities: Secondary | ICD-10-CM

## 2022-12-13 MED ORDER — NA SULFATE-K SULFATE-MG SULF 17.5-3.13-1.6 GM/177ML PO SOLN: 1.0000 | Freq: Once | ORAL | 0 refills | Status: AC

## 2022-12-13 NOTE — Addendum Note (Signed)
Addended by: Avie Arenas on: 12/13/2022 11:25 AM   Modules accepted: Orders

## 2022-12-13 NOTE — Telephone Encounter (Signed)
Pt left message to change his procedure date please return call

## 2022-12-13 NOTE — Telephone Encounter (Signed)
Gastroenterology Pre-Procedure Review  Request Date: 01/16/23 Requesting Physician: Dr. Allegra Lai  PATIENT REVIEW QUESTIONS: The patient responded to the following health history questions as indicated:    1. Are you having any GI issues? yes (gas takes otc med, and gingerale.  Constipation induced by prostate cancer medication takes Senna which helps) 2. Do you have a personal history of Polyps? no 3. Do you have a family history of Colon Cancer or Polyps? no 4. Diabetes Mellitus? no 5. Joint replacements in the past 12 months?no 6. Major health problems in the past 3 months?no 7. Any artificial heart valves, MVP, or defibrillator?no    MEDICATIONS & ALLERGIES:    Patient reports the following regarding taking any anticoagulation/antiplatelet therapy:   Plavix, Coumadin, Eliquis, Xarelto, Lovenox, Pradaxa, Brilinta, or Effient? no Aspirin? no  Patient confirms/reports the following medications:  Current Outpatient Medications  Medication Sig Dispense Refill   aspirin EC 81 MG tablet Take 81 mg by mouth daily. Swallow whole.     lisinopril-hydrochlorothiazide (ZESTORETIC) 10-12.5 MG tablet Take 0.5 tablets by mouth daily. 45 tablet 3   meloxicam (MOBIC) 7.5 MG tablet Take 7.5 mg by mouth daily as needed for pain.     metFORMIN (GLUCOPHAGE) 500 MG tablet Take 1 tablet (500 mg total) by mouth daily with supper. 90 tablet 3   methocarbamol (ROBAXIN) 500 MG tablet Take 500 mg by mouth 4 (four) times daily.     NUBEQA 300 MG tablet Take 600 mg by mouth 2 (two) times daily.     Omega-3 Fatty Acids (FISH OIL) 1200 MG CAPS Take 1 capsule by mouth 2 (two) times daily.     OZEMPIC, 1 MG/DOSE, 4 MG/3ML SOPN Inject 1 mg as directed once a week. 9 mL 3   pravastatin (PRAVACHOL) 10 MG tablet Take 1 tablet (10 mg total) by mouth daily. 90 tablet 3   tamsulosin (FLOMAX) 0.4 MG CAPS capsule Take 1 capsule (0.4 mg total) by mouth daily after supper. 90 capsule 3   valACYclovir (VALTREX) 1000 MG tablet  Take 1 tablet (1,000 mg total) by mouth daily as needed (HSV flare). For up to 5-7 as needed for flare, can repeat. (Patient not taking: Reported on 11/28/2022) 21 tablet 1   No current facility-administered medications for this visit.    Patient confirms/reports the following allergies:  No Known Allergies  No orders of the defined types were placed in this encounter.   AUTHORIZATION INFORMATION Primary Insurance: 1D#: Group #:  Secondary Insurance: 1D#: Group #:  SCHEDULE INFORMATION: Date: 01/16/23 Time: Location: [facility]

## 2022-12-13 NOTE — Telephone Encounter (Signed)
Pt left message to schedule colonoscopy please return call  

## 2022-12-14 NOTE — Telephone Encounter (Signed)
Patient does not want to reschedule colonoscopy now for 01/16/2023. He states he wants to have it done now on 01/16/2023

## 2022-12-14 NOTE — Telephone Encounter (Signed)
Returned patients call to confirm that he does want to have his procedure on 01/16/23.  He confirmed to keep procedure as scheduled for 01/16/23.  Thanks, Marcelino Duster CMA

## 2022-12-19 ENCOUNTER — Telehealth: Payer: Self-pay | Admitting: Family Medicine

## 2022-12-19 NOTE — Telephone Encounter (Signed)
Pt is calling to schedule AWV CB- [phone]

## 2022-12-20 DIAGNOSIS — Z79818 Long term (current) use of other agents affecting estrogen receptors and estrogen levels: Secondary | ICD-10-CM | POA: Diagnosis not present

## 2022-12-20 DIAGNOSIS — C61 Malignant neoplasm of prostate: Secondary | ICD-10-CM | POA: Diagnosis not present

## 2022-12-22 ENCOUNTER — Telehealth: Payer: Self-pay | Admitting: Family Medicine

## 2022-12-22 NOTE — Telephone Encounter (Signed)
Contacted Duane Grant to schedule their annual wellness visit. Appointment made for 01/12/2023.  Called patient 12/09/22 to scheduled AWV.    Duane Grant; Care Guide Ambulatory Clinical Support [place] [facility] Direct Dial: [phone]

## 2023-01-12 ENCOUNTER — Ambulatory Visit (INDEPENDENT_AMBULATORY_CARE_PROVIDER_SITE_OTHER): Payer: BC Managed Care – PPO

## 2023-01-12 VITALS — Ht 68.0 in | Wt 214.0 lb

## 2023-01-12 DIAGNOSIS — Z0000 Encounter for general adult medical examination without abnormal findings: Secondary | ICD-10-CM | POA: Diagnosis not present

## 2023-01-12 NOTE — Progress Notes (Signed)
I connected with  Valerie Salts on 01/12/23 by a audio enabled telemedicine application and verified that I am speaking with the correct person using two identifiers.  Patient Location: Home  Provider Location: Office/Clinic  I discussed the limitations of evaluation and management by telemedicine. The patient expressed understanding and agreed to proceed.  Subjective:   KIERIN TEMPEST is a 67 y.o. male who presents for Medicare Annual/Subsequent preventive examination.  Review of Systems     Cardiac Risk Factors include: advanced age (>39men, >17 women);diabetes mellitus;dyslipidemia;hypertension;male gender;obesity (BMI >30kg/m2)     Objective:    Today's Vitals   01/12/23 1536  Weight: 214 lb (97.1 kg)  Height: 5\' 8"  (1.727 m)   Body mass index is 32.54 kg/m.     01/12/2023    3:50 PM 12/22/2020    8:19 AM 05/23/2020    8:20 AM 12/17/2019   10:03 AM 08/25/2016    5:29 PM 06/12/2016    1:57 PM 06/09/2016   12:15 PM  Advanced Directives  Does Patient Have a Medical Advance Directive? No No No No No No No  Would patient like information on creating a medical advance directive? No - Patient declined     No - patient declined information No - patient declined information    Current Medications (verified) Outpatient Encounter Medications as of 01/12/2023  Medication Sig   aspirin EC 81 MG tablet Take 81 mg by mouth daily. Swallow whole.   lisinopril-hydrochlorothiazide (ZESTORETIC) 10-12.5 MG tablet Take 0.5 tablets by mouth daily.   meloxicam (MOBIC) 7.5 MG tablet Take 7.5 mg by mouth daily as needed for pain.   metFORMIN (GLUCOPHAGE) 500 MG tablet Take 1 tablet (500 mg total) by mouth daily with supper.   methocarbamol (ROBAXIN) 500 MG tablet Take 500 mg by mouth 4 (four) times daily.   NUBEQA 300 MG tablet Take 600 mg by mouth 2 (two) times daily.   Omega-3 Fatty Acids (FISH OIL) 1200 MG CAPS Take 1 capsule by mouth 2 (two) times daily.   OZEMPIC, 1 MG/DOSE, 4 MG/3ML SOPN  Inject 1 mg as directed once a week.   pravastatin (PRAVACHOL) 10 MG tablet Take 1 tablet (10 mg total) by mouth daily.   tamsulosin (FLOMAX) 0.4 MG CAPS capsule Take 1 capsule (0.4 mg total) by mouth daily after supper.   valACYclovir (VALTREX) 1000 MG tablet Take 1 tablet (1,000 mg total) by mouth daily as needed (HSV flare). For up to 5-7 as needed for flare, can repeat.   No facility-administered encounter medications on file as of 01/12/2023.    Allergies (verified) Patient has no known allergies.   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   Procedure: TOTAL HIP ARTHROPLASTY;  Surgeon: Juanell Fairly, MD;  Location: [facility] ORS;  Service: Orthopedics;  Laterality: Left;   Family History  Problem Relation Age of Onset   Diabetes Mother    Hypertension Mother  Social History   Socioeconomic History   Marital status: Married    Spouse name: Machai Law   Number of children: 3   Years of education: High School   Highest education level: High school graduate  Occupational History   Occupation: [employer] doctor  Tobacco Use   Smoking status: Former    Packs/day: 0.50    Years: 35.00    Additional pack years: 0.00    Total pack years: 17.50    Types: Cigarettes    Quit date: 11/14/1994    Years since quitting: 28.1   Smokeless tobacco: Former  Building services engineer Use: Never used  Substance and Sexual Activity   Alcohol use: Yes    Comment: occassional   Drug use: No   Sexual activity: Yes    Birth  control/protection: None  Other Topics Concern   Not on file  Social History Narrative   Not on file   Social Determinants of Health   Financial Resource Strain: Low Risk  (01/12/2023)   Overall Financial Resource Strain (CARDIA)    Difficulty of Paying Living Expenses: Not hard at all  Food Insecurity: No Food Insecurity (01/12/2023)   Hunger Vital Sign    Worried About Running Out of Food in the Last Year: Never true    Ran Out of Food in the Last Year: Never true  Transportation Needs: No Transportation Needs (01/12/2023)   PRAPARE - Administrator, Civil Service (Medical): No    Lack of Transportation (Non-Medical): No  Physical Activity: Insufficiently Active (01/12/2023)   Exercise Vital Sign    Days of Exercise per Week: 3 days    Minutes of Exercise per Session: 40 min  Stress: No Stress Concern Present (01/12/2023)   [employer] of Occupational Health - Occupational Stress Questionnaire    Feeling of Stress : Not at all  Social Connections: Moderately Isolated (01/12/2023)   Social Connection and Isolation Panel [NHANES]    Frequency of Communication with Friends and Family: More than three times a week    Frequency of Social Gatherings with Friends and Family: Never    Attends Religious Services: Never    Diplomatic Services operational officer: No    Attends Engineer, structural: Never    Marital Status: Married    Tobacco Counseling Counseling given: Not Answered   Clinical Intake:  Pre-visit preparation completed: Yes  Pain : No/denies pain     Nutritional Risks: None Diabetes: Yes CBG done?: No Did pt. bring in CBG monitor from home?: No  How often do you need to have someone help you when you read instructions, pamphlets, or other written materials from your doctor or pharmacy?: 1 - Never  Diabetic?yes Nutrition Risk Assessment:  Has the patient had any N/V/D within the last 2 months?  No  Does the patient have any  non-healing wounds?  No  Has the patient had any unintentional weight loss or weight gain?  No   Diabetes:  Is the patient diabetic?  Yes  If diabetic, was a CBG obtained today?  No  Did the patient bring in their glucometer from home?  No  How often do you monitor your CBG's? occasionally.   Financial Strains and Diabetes Management:  Are you having any financial strains with the device, your supplies or your medication? No .  Does the patient want to be seen by Chronic Care Management for management of their diabetes?  No  Would the patient  like to be referred to a Nutritionist or for Diabetic Management?  No   Diabetic Exams:  Diabetic Eye Exam: Completed 12/13/21. Overdue for diabetic eye exam. Pt has been advised about the importance in completing this exam.   Diabetic Foot Exam: Completed 05/23/22. Pt has been advised about the importance in completing this exam.   Interpreter Needed?: No  Information entered by :: Kennedy Bucker, LPN   Activities of Daily Living    01/12/2023    3:50 PM 11/28/2022    8:11 AM  In your present state of health, do you have any difficulty performing the following activities:  Hearing? 0 0  Vision? 0 0  Difficulty concentrating or making decisions? 0 0  Walking or climbing stairs? 0 1  Dressing or bathing? 0 0  Doing errands, shopping? 0 0  Preparing Food and eating ? N   Using the Toilet? N   In the past six months, have you accidently leaked urine? N   Do you have problems with loss of bowel control? N   Managing your Medications? N   Managing your Finances? N   Housekeeping or managing your Housekeeping? N     Patient Care Team: Smitty Cords, DO as PCP - General (Family Medicine) Kemper Durie, RN as [facility] Care Management  Indicate any recent Medical Services you may have received from other than [facility] providers in the past year (date may be approximate).     Assessment:   This is a routine wellness  examination for [facility].  Hearing/Vision screen Hearing Screening - Comments:: No aids Vision Screening - Comments:: Wears glasses- [facility] in [place]   Dietary issues and exercise activities discussed: Current Exercise Habits: Home exercise routine, Type of exercise: Other - see comments (stat bike), Time (Minutes): 40, Frequency (Times/Week): 3, Weekly Exercise (Minutes/Week): 120, Intensity: Mild   Goals Addressed             This Visit's Progress    DIET - EAT MORE FRUITS AND VEGETABLES         Depression Screen    01/12/2023    3:47 PM 11/28/2022    8:07 AM 05/23/2022    8:16 AM 12/30/2021    8:52 AM 11/15/2021    7:58 AM 12/22/2020    8:19 AM 05/11/2020    8:11 AM  PHQ 2/9 Scores  PHQ - 2 Score 0 0 0 0 2 0 0  PHQ- 9 Score 0 0 0 3 4      Fall Risk    01/12/2023    3:50 PM 11/28/2022    8:11 AM 05/23/2022    8:16 AM 12/30/2021    9:04 AM 11/15/2021    7:58 AM  Fall Risk   Falls in the past year? 0 0 0 0 0  Number falls in past yr: 0  0 0 0  Injury with Fall? 0  0 0 0  Risk for fall due to : No Fall Risks  No Fall Risks No Fall Risks No Fall Risks  Follow up Falls prevention discussed;Falls evaluation completed  Falls evaluation completed Falls evaluation completed Falls evaluation completed    FALL RISK PREVENTION PERTAINING TO THE HOME:  Any stairs in or around the home? Yes  If so, are there any without handrails? No  Home free of loose throw rugs in walkways, pet beds, electrical cords, etc? Yes  Adequate lighting in your home to reduce risk of falls? Yes  ASSISTIVE DEVICES UTILIZED TO PREVENT FALLS:  Life alert? No  Use of a cane, walker or w/c? No  Grab bars in the bathroom? No  Shower chair or bench in shower? No  Elevated toilet seat or a handicapped toilet? No    Cognitive Function:        01/12/2023    3:55 PM 12/30/2021    9:12 AM 12/22/2020    8:21 AM  6CIT Screen  What Year? 0 points 0 points 0 points  What month? 0 points 0 points  0 points  What time? 0 points 0 points 0 points  Count back from 20 0 points 0 points 0 points  Months in reverse 0 points 0 points 0 points  Repeat phrase 0 points 0 points 0 points  Total Score 0 points 0 points 0 points    Immunizations Immunization History  Administered Date(s) Administered   Fluad Quad(high Dose 65+) 05/27/2020, 05/23/2022   Influenza, High Dose Seasonal PF 10/07/2018, 07/24/2019   Influenza, Quadrivalent, Recombinant, Inj, Pf 05/20/2019   Influenza-Unspecified 05/20/2019, 06/14/2021   PFIZER Comirnaty(Gray Top)Covid-19 Tri-Sucrose Vaccine 11/26/2019, 12/17/2019   PFIZER(Purple Top)SARS-COV-2 Vaccination 11/26/2019, 12/17/2019, 07/18/2020   PNEUMOCOCCAL CONJUGATE-20 02/19/2022   Pfizer Covid-19 Vaccine Bivalent Booster 63yrs & up 02/19/2022   Pneumococcal Conjugate-13 05/20/2019   Pneumococcal Polysaccharide-23 06/09/2020   Respiratory Syncytial Virus Vaccine,Recomb Aduvanted(Arexvy) 10/22/2022   Tdap 05/16/2020   Zoster Recombinat (Shingrix) 05/20/2019, 07/21/2019    TDAP status: Up to date  Flu Vaccine status: Up to date  Pneumococcal vaccine status: Up to date  Covid-19 vaccine status: Completed vaccines  Qualifies for Shingles Vaccine? Yes   Zostavax completed No   Shingrix Completed?: Yes  Screening Tests Health Maintenance  Topic Date Due   COVID-19 Vaccine (7 - 2023-24 season) 04/16/2022   OPHTHALMOLOGY EXAM  12/14/2022   INFLUENZA VACCINE  03/16/2023   HEMOGLOBIN A1C  05/23/2023   FOOT EXAM  05/24/2023   Diabetic kidney evaluation - eGFR measurement  11/21/2023   Diabetic kidney evaluation - Urine ACR  11/28/2023   Medicare Annual Wellness (AWV)  01/12/2024   Fecal DNA (Cologuard)  12/02/2025   DTaP/Tdap/Td (2 - Td or Tdap) 05/16/2030   Pneumonia Vaccine 25+ Years old  Completed   Hepatitis C Screening  Completed   Zoster Vaccines- Shingrix  Completed   HPV VACCINES  Aged Out   Colonoscopy  Discontinued    Health  Maintenance  Health Maintenance Due  Topic Date Due   COVID-19 Vaccine (7 - 2023-24 season) 04/16/2022   OPHTHALMOLOGY EXAM  12/14/2022    Colorectal cancer screening: Type of screening: Cologuard. Completed 12/03/22. Repeat every 3 years IS having a colonoscopy on 01/16/23  Lung Cancer Screening: (Low Dose CT Chest recommended if Age 93-80 years, 30 pack-year currently smoking OR have quit w/in 15years.) does not qualify.    Additional Screening:  Hepatitis C Screening: does qualify; Completed 10/26/20  Vision Screening: Recommended annual ophthalmology exams for early detection of glaucoma and other disorders of the eye. Is the patient up to date with their annual eye exam?  Yes  Who is the provider or what is the name of the office in which the patient attends annual eye exams? [facility] If pt is not established with a provider, would they like to be referred to a provider to establish care? No .   Dental Screening: Recommended annual dental exams for proper oral hygiene  Community Resource Referral / Chronic Care Management: CRR required this visit?  No   CCM required this visit?  No      Plan:     I have personally reviewed and noted the following in the patient's chart:   Medical and social history Use of alcohol, tobacco or illicit drugs  Current medications and supplements including opioid prescriptions. Patient is not currently taking opioid prescriptions. Functional ability and status Nutritional status Physical activity Advanced directives List of other physicians Hospitalizations, surgeries, and ER visits in previous 12 months Vitals Screenings to include cognitive, depression, and falls Referrals and appointments  In addition, I have reviewed and discussed with patient certain preventive protocols, quality metrics, and best practice recommendations. A written personalized care plan for preventive services as well as general preventive health  recommendations were provided to patient.     Hal Hope, LPN   03/25/9146   Nurse Notes: none

## 2023-01-12 NOTE — Patient Instructions (Signed)
Duane Grant , Thank you for taking time to come for your Medicare Wellness Visit. I appreciate your ongoing commitment to your health goals. Please review the following plan we discussed and let me know if I can assist you in the future.   These are the goals we discussed:  Goals      DIET - EAT MORE FRUITS AND VEGETABLES     Exercise 150 min/wk Moderate Activity     Patient Stated     12/22/2020, wants to weigh 190 pounds        This is a list of the screening recommended for you and due dates:  Health Maintenance  Topic Date Due   COVID-19 Vaccine (7 - 2023-24 season) 04/16/2022   Eye exam for diabetics  12/14/2022   Flu Shot  03/16/2023   Hemoglobin A1C  05/23/2023   Complete foot exam   05/24/2023   Yearly kidney function blood test for diabetes  11/21/2023   Yearly kidney health urinalysis for diabetes  11/28/2023   Medicare Annual Wellness Visit  01/12/2024   Cologuard (Stool DNA test)  12/02/2025   DTaP/Tdap/Td vaccine (2 - Td or Tdap) 05/16/2030   Pneumonia Vaccine  Completed   Hepatitis C Screening  Completed   Zoster (Shingles) Vaccine  Completed   HPV Vaccine  Aged Out   Colon Cancer Screening  Discontinued    Advanced directives: no  Conditions/risks identified: none  Next appointment: Follow up in one year for your annual wellness visit. 01/18/24 @ 3:30 pm by phone  Preventive Care 65 Years and Older, Male  Preventive care refers to lifestyle choices and visits with your health care provider that can promote health and wellness. What does preventive care include? A yearly physical exam. This is also called an annual well check. Dental exams once or twice a year. Routine eye exams. Ask your health care provider how often you should have your eyes checked. Personal lifestyle choices, including: Daily care of your teeth and gums. Regular physical activity. Eating a healthy diet. Avoiding tobacco and drug use. Limiting alcohol use. Practicing safe sex. Taking  low doses of aspirin every day. Taking vitamin and mineral supplements as recommended by your health care provider. What happens during an annual well check? The services and screenings done by your health care provider during your annual well check will depend on your age, overall health, lifestyle risk factors, and family history of disease. Counseling  Your health care provider may ask you questions about your: Alcohol use. Tobacco use. Drug use. Emotional well-being. Home and relationship well-being. Sexual activity. Eating habits. History of falls. Memory and ability to understand (cognition). Work and work Astronomer. Screening  You may have the following tests or measurements: Height, weight, and BMI. Blood pressure. Lipid and cholesterol levels. These may be checked every 5 years, or more frequently if you are over 25 years old. Skin check. Lung cancer screening. You may have this screening every year starting at age 12 if you have a 30-pack-year history of smoking and currently smoke or have quit within the past 15 years. Fecal occult blood test (FOBT) of the stool. You may have this test every year starting at age 65. Flexible sigmoidoscopy or colonoscopy. You may have a sigmoidoscopy every 5 years or a colonoscopy every 10 years starting at age 103. Prostate cancer screening. Recommendations will vary depending on your family history and other risks. Hepatitis C blood test. Hepatitis B blood test. Sexually transmitted disease (STD) testing. Diabetes  screening. This is done by checking your blood sugar (glucose) after you have not eaten for a while (fasting). You may have this done every 1-3 years. Abdominal aortic aneurysm (AAA) screening. You may need this if you are a current or former smoker. Osteoporosis. You may be screened starting at age 85 if you are at high risk. Talk with your health care provider about your test results, treatment options, and if necessary, the  need for more tests. Vaccines  Your health care provider may recommend certain vaccines, such as: Influenza vaccine. This is recommended every year. Tetanus, diphtheria, and acellular pertussis (Tdap, Td) vaccine. You may need a Td booster every 10 years. Zoster vaccine. You may need this after age 91. Pneumococcal 13-valent conjugate (PCV13) vaccine. One dose is recommended after age 23. Pneumococcal polysaccharide (PPSV23) vaccine. One dose is recommended after age 74. Talk to your health care provider about which screenings and vaccines you need and how often you need them. This information is not intended to replace advice given to you by your health care provider. Make sure you discuss any questions you have with your health care provider. Document Released: 08/28/2015 Document Revised: 04/20/2016 Document Reviewed: 06/02/2015 Elsevier Interactive Patient Education  2017 [employer].  Fall Prevention in the Home Falls can cause injuries. They can happen to people of all ages. There are many things you can do to make your home safe and to help prevent falls. What can I do on the outside of my home? Regularly fix the edges of walkways and driveways and fix any cracks. Remove anything that might make you trip as you walk through a door, such as a raised step or threshold. Trim any bushes or trees on the path to your home. Use bright outdoor lighting. Clear any walking paths of anything that might make someone trip, such as rocks or tools. Regularly check to see if handrails are loose or broken. Make sure that both sides of any steps have handrails. Any raised decks and porches should have guardrails on the edges. Have any leaves, snow, or ice cleared regularly. Use sand or salt on walking paths during winter. Clean up any spills in your garage right away. This includes oil or grease spills. What can I do in the bathroom? Use night lights. Install grab bars by the toilet and in the tub  and shower. Do not use towel bars as grab bars. Use non-skid mats or decals in the tub or shower. If you need to sit down in the shower, use a plastic, non-slip stool. Keep the floor dry. Clean up any water that spills on the floor as soon as it happens. Remove soap buildup in the tub or shower regularly. Attach bath mats securely with double-sided non-slip rug tape. Do not have throw rugs and other things on the floor that can make you trip. What can I do in the bedroom? Use night lights. Make sure that you have a light by your bed that is easy to reach. Do not use any sheets or blankets that are too big for your bed. They should not hang down onto the floor. Have a firm chair that has side arms. You can use this for support while you get dressed. Do not have throw rugs and other things on the floor that can make you trip. What can I do in the kitchen? Clean up any spills right away. Avoid walking on wet floors. Keep items that you use a lot in easy-to-reach places.  If you need to reach something above you, use a strong step stool that has a grab bar. Keep electrical cords out of the way. Do not use floor polish or wax that makes floors slippery. If you must use wax, use non-skid floor wax. Do not have throw rugs and other things on the floor that can make you trip. What can I do with my stairs? Do not leave any items on the stairs. Make sure that there are handrails on both sides of the stairs and use them. Fix handrails that are broken or loose. Make sure that handrails are as long as the stairways. Check any carpeting to make sure that it is firmly attached to the stairs. Fix any carpet that is loose or worn. Avoid having throw rugs at the top or bottom of the stairs. If you do have throw rugs, attach them to the floor with carpet tape. Make sure that you have a light switch at the top of the stairs and the bottom of the stairs. If you do not have them, ask someone to add them for  you. What else can I do to help prevent falls? Wear shoes that: Do not have high heels. Have rubber bottoms. Are comfortable and fit you well. Are closed at the toe. Do not wear sandals. If you use a stepladder: Make sure that it is fully opened. Do not climb a closed stepladder. Make sure that both sides of the stepladder are locked into place. Ask someone to hold it for you, if possible. Clearly mark and make sure that you can see: Any grab bars or handrails. First and last steps. Where the edge of each step is. Use tools that help you move around (mobility aids) if they are needed. These include: Canes. Walkers. Scooters. Crutches. Turn on the lights when you go into a dark area. Replace any light bulbs as soon as they burn out. Set up your furniture so you have a clear path. Avoid moving your furniture around. If any of your floors are uneven, fix them. If there are any pets around you, be aware of where they are. Review your medicines with your doctor. Some medicines can make you feel dizzy. This can increase your chance of falling. Ask your doctor what other things that you can do to help prevent falls. This information is not intended to replace advice given to you by your health care provider. Make sure you discuss any questions you have with your health care provider. Document Released: 05/28/2009 Document Revised: 01/07/2016 Document Reviewed: 09/05/2014 Elsevier Interactive Patient Education  2017 [employer].

## 2023-01-13 ENCOUNTER — Telehealth: Payer: Self-pay

## 2023-01-13 ENCOUNTER — Encounter: Payer: Self-pay | Admitting: Gastroenterology

## 2023-01-13 NOTE — Telephone Encounter (Signed)
Cardiac clearance has been granted for patient to have his colonoscopy as scheduled on 01/16/23.  Clearance granted from Dr. Tish Frederickson.  Thanks,  [place], [place]

## 2023-01-16 ENCOUNTER — Ambulatory Visit: Payer: BC Managed Care – PPO | Admitting: Anesthesiology

## 2023-01-16 ENCOUNTER — Encounter: Payer: Self-pay | Admitting: Gastroenterology

## 2023-01-16 ENCOUNTER — Encounter
Admission: RE | Disposition: A | Discharge status: Home / Self Care | Payer: Self-pay | Source: Home / Self Care | Attending: Gastroenterology

## 2023-01-16 ENCOUNTER — Other Ambulatory Visit: Payer: Self-pay

## 2023-01-16 ENCOUNTER — Ambulatory Visit
Admission: RE | Admit: 2023-01-16 | Discharge: 2023-01-16 | Disposition: A | Discharge status: Home / Self Care | Payer: BC Managed Care – PPO | Attending: Gastroenterology | Admitting: Gastroenterology

## 2023-01-16 DIAGNOSIS — E785 Hyperlipidemia, unspecified: Secondary | ICD-10-CM | POA: Insufficient documentation

## 2023-01-16 DIAGNOSIS — Z1211 Encounter for screening for malignant neoplasm of colon: Secondary | ICD-10-CM | POA: Diagnosis not present

## 2023-01-16 DIAGNOSIS — E1165 Type 2 diabetes mellitus with hyperglycemia: Secondary | ICD-10-CM | POA: Insufficient documentation

## 2023-01-16 DIAGNOSIS — Z8546 Personal history of malignant neoplasm of prostate: Secondary | ICD-10-CM | POA: Diagnosis not present

## 2023-01-16 DIAGNOSIS — D120 Benign neoplasm of cecum: Secondary | ICD-10-CM | POA: Insufficient documentation

## 2023-01-16 DIAGNOSIS — I10 Essential (primary) hypertension: Secondary | ICD-10-CM | POA: Insufficient documentation

## 2023-01-16 DIAGNOSIS — Z7984 Long term (current) use of oral hypoglycemic drugs: Secondary | ICD-10-CM | POA: Diagnosis not present

## 2023-01-16 DIAGNOSIS — Z7985 Long-term (current) use of injectable non-insulin antidiabetic drugs: Secondary | ICD-10-CM | POA: Insufficient documentation

## 2023-01-16 DIAGNOSIS — Z87891 Personal history of nicotine dependence: Secondary | ICD-10-CM | POA: Insufficient documentation

## 2023-01-16 DIAGNOSIS — K635 Polyp of colon: Secondary | ICD-10-CM

## 2023-01-16 DIAGNOSIS — K621 Rectal polyp: Secondary | ICD-10-CM | POA: Diagnosis not present

## 2023-01-16 DIAGNOSIS — D126 Benign neoplasm of colon, unspecified: Secondary | ICD-10-CM

## 2023-01-16 DIAGNOSIS — Z79899 Other long term (current) drug therapy: Secondary | ICD-10-CM | POA: Diagnosis not present

## 2023-01-16 DIAGNOSIS — R195 Other fecal abnormalities: Secondary | ICD-10-CM | POA: Diagnosis not present

## 2023-01-16 HISTORY — PX: COLONOSCOPY WITH PROPOFOL: SHX5780

## 2023-01-16 LAB — GLUCOSE, CAPILLARY: Glucose-Capillary: 233 mg/dL — ABNORMAL HIGH (ref 70–99)

## 2023-01-16 SURGERY — COLONOSCOPY WITH PROPOFOL
Anesthesia: General

## 2023-01-16 MED ORDER — EPHEDRINE 5 MG/ML INJ
INTRAVENOUS | Status: AC
  Filled 2023-01-16: qty 5

## 2023-01-16 MED ORDER — SODIUM CHLORIDE 0.9 % IV SOLN: INTRAVENOUS | Status: DC

## 2023-01-16 MED ORDER — EPHEDRINE SULFATE (PRESSORS) 50 MG/ML IJ SOLN
INTRAMUSCULAR | Status: DC | PRN
  Administered 2023-01-16: 5 mg via INTRAVENOUS

## 2023-01-16 MED ORDER — PROPOFOL 10 MG/ML IV BOLUS
INTRAVENOUS | Status: DC | PRN
  Administered 2023-01-16: 90 mg via INTRAVENOUS

## 2023-01-16 MED ORDER — PHENYLEPHRINE 80 MCG/ML (10ML) SYRINGE FOR IV PUSH (FOR BLOOD PRESSURE SUPPORT)
PREFILLED_SYRINGE | INTRAVENOUS | Status: AC
  Filled 2023-01-16: qty 10

## 2023-01-16 MED ORDER — PROPOFOL 1000 MG/100ML IV EMUL
INTRAVENOUS | Status: AC
  Filled 2023-01-16: qty 100

## 2023-01-16 MED ORDER — PROPOFOL 500 MG/50ML IV EMUL
INTRAVENOUS | Status: DC | PRN
  Administered 2023-01-16: 187.366 ug/kg/min via INTRAVENOUS

## 2023-01-16 MED ORDER — LIDOCAINE HCL (CARDIAC) PF 100 MG/5ML IV SOSY
PREFILLED_SYRINGE | INTRAVENOUS | Status: DC | PRN
  Administered 2023-01-16: 100 mg via INTRAVENOUS

## 2023-01-16 NOTE — Anesthesia Preprocedure Evaluation (Addendum)
Anesthesia Evaluation  Patient identified by MRN, date of birth, ID band Patient awake    Reviewed: Allergy & Precautions, H&P , NPO status , Patient's Chart, lab work & pertinent test results  Airway Mallampati: II  TM Distance: >3 FB Neck ROM: full    Dental  (+) Missing   Pulmonary former smoker   Pulmonary exam normal        Cardiovascular Exercise Tolerance: Good hypertension, Normal cardiovascular exam     Neuro/Psych negative neurological ROS  negative psych ROS   GI/Hepatic negative GI ROS, Neg liver ROS,,,  Endo/Other  diabetes (A1c 7.7), Poorly Controlled, Type 2    Renal/GU negative Renal ROS  negative genitourinary   Musculoskeletal  (+) Arthritis ,    Abdominal Normal abdominal exam  (+)   Peds  Hematology negative hematology ROS (+)   Anesthesia Other Findings Past Medical History: No date: Arthritis 2009: Cancer (HCC)     Comment:  Prostate, radiation but no surgery No date: Elevated PSA 12/02/2019: H/O removal of cyst     Comment:  back  No date: History of prostate cancer No date: Hyperlipidemia No date: Hypertension  Past Surgical History: 2010: HERNIA REPAIR     Comment:  Umbilical 2010: HERNIA REPAIR No date: JOINT REPLACEMENT 2015: Removal Fatty Tumor; Right     Comment:  [facility], right side of chin/jaw 12/15/2020: TOOTH EXTRACTION 06/09/2016: TOTAL HIP ARTHROPLASTY; Left     Comment:  Procedure: TOTAL HIP ARTHROPLASTY;  Surgeon: Juanell Fairly, MD;  Location: [facility] ORS;  Service:               Orthopedics;  Laterality: Left;     Reproductive/Obstetrics negative OB ROS                             Anesthesia Physical Anesthesia Plan  ASA: 2  Anesthesia Plan: General   Post-op Pain Management:    Induction:   PONV Risk Score and Plan: Propofol infusion and TIVA  Airway Management Planned: Natural Airway  Additional  Equipment:   Intra-op Plan:   Post-operative Plan:   Informed Consent: I have reviewed the patients History and Physical, chart, labs and discussed the procedure including the risks, benefits and alternatives for the proposed anesthesia with the patient or authorized representative who has indicated his/her understanding and acceptance.     Dental Advisory Given  Plan Discussed with: CRNA and Surgeon  Anesthesia Plan Comments:         Anesthesia Quick Evaluation

## 2023-01-16 NOTE — H&P (Signed)
Arlyss Repress, MD [address]  [address]  [place], [place] [address]  Main: [phone]  Fax: [phone] Pager: [phone]  Primary Care Physician:  Smitty Cords, DO Primary Gastroenterologist:  Dr. Arlyss Repress  Pre-Procedure History & Physical: HPI:  Duane Grant is a 67 y.o. male is here for an colonoscopy.   Past Medical History:  Diagnosis Date   Arthritis    Cancer [facility]) 2009   Prostate, radiation but no surgery   Diabetes mellitus without complication (HCC)    Elevated PSA    H/O removal of cyst 12/02/2019   back    History of prostate cancer    Hyperlipidemia    Hypertension     Past Surgical History:  Procedure Laterality Date   HERNIA REPAIR  2010   Umbilical   HERNIA REPAIR  2010   JOINT REPLACEMENT     Removal Fatty Tumor Right 2015   [facility], right side of chin/jaw   TOOTH EXTRACTION  12/15/2020   TOTAL HIP ARTHROPLASTY Left 06/09/2016   Procedure: TOTAL HIP ARTHROPLASTY;  Surgeon: Juanell Fairly, MD;  Location: [facility] ORS;  Service: Orthopedics;  Laterality: Left;    Prior to Admission medications   Medication Sig Start Date End Date Taking? Authorizing Provider  aspirin EC 81 MG tablet Take 81 mg by mouth daily. Swallow whole.   Yes [provider]  lisinopril-hydrochlorothiazide (ZESTORETIC) 10-12.5 MG tablet Take 0.5 tablets by mouth daily. 05/23/22  Yes Karamalegos, Netta Neat, DO  meloxicam (MOBIC) 7.5 MG tablet Take 7.5 mg by mouth daily as needed for pain.   Yes [provider]  metFORMIN (GLUCOPHAGE) 500 MG tablet Take 1 tablet (500 mg total) by mouth daily with supper. 05/23/22  Yes Karamalegos, Alexander J, DO  NUBEQA 300 MG tablet Take 600 mg by mouth 2 (two) times daily.   Yes [provider]  Omega-3 Fatty Acids (FISH OIL) 1200 MG CAPS Take 1 capsule by mouth 2 (two) times daily.   Yes [provider]  pravastatin (PRAVACHOL) 10 MG tablet Take 1 tablet (10 mg total) by  mouth daily. 11/28/22  Yes Karamalegos, Netta Neat, DO  tamsulosin (FLOMAX) 0.4 MG CAPS capsule Take 1 capsule (0.4 mg total) by mouth daily after supper. 05/23/22  Yes Karamalegos, Netta Neat, DO  methocarbamol (ROBAXIN) 500 MG tablet Take 500 mg by mouth 4 (four) times daily.    [provider]  OZEMPIC, 1 MG/DOSE, 4 MG/3ML SOPN Inject 1 mg as directed once a week. 05/23/22   Karamalegos, Netta Neat, DO  valACYclovir (VALTREX) 1000 MG tablet Take 1 tablet (1,000 mg total) by mouth daily as needed (HSV flare). For up to 5-7 as needed for flare, can repeat. 05/11/20   Smitty Cords, DO    Allergies as of 12/13/2022   (No Known Allergies)    Family History  Problem Relation Age of Onset   Diabetes Mother    Hypertension Mother     Social History   Socioeconomic History   Marital status: Married    Spouse name: Rooney Chernesky   Number of children: 3   Years of education: High School   Highest education level: High school graduate  Occupational History   Occupation: [employer] doctor  Tobacco Use   Smoking status: Former    Packs/day: 0.50    Years: 35.00    Additional pack years: 0.00    Total pack years: 17.50    Types: Cigarettes  Quit date: 11/14/1994    Years since quitting: 28.1   Smokeless tobacco: Former  Building services engineer Use: Never used  Substance and Sexual Activity   Alcohol use: Yes    Comment: occassional,none last 24hrs   Drug use: No   Sexual activity: Yes    Birth control/protection: None  Other Topics Concern   Not on file  Social History Narrative   Not on file   Social Determinants of Health   Financial Resource Strain: Low Risk  (01/12/2023)   Overall Financial Resource Strain (CARDIA)    Difficulty of Paying Living Expenses: Not hard at all  Food Insecurity: No Food Insecurity (01/12/2023)   Hunger Vital Sign    Worried About Running Out of Food in the Last Year: Never true    Ran Out of Food in the Last Year: Never true   Transportation Needs: No Transportation Needs (01/12/2023)   PRAPARE - Administrator, Civil Service (Medical): No    Lack of Transportation (Non-Medical): No  Physical Activity: Insufficiently Active (01/12/2023)   Exercise Vital Sign    Days of Exercise per Week: 3 days    Minutes of Exercise per Session: 40 min  Stress: No Stress Concern Present (01/12/2023)   [employer] of Occupational Health - Occupational Stress Questionnaire    Feeling of Stress : Not at all  Social Connections: Moderately Isolated (01/12/2023)   Social Connection and Isolation Panel [NHANES]    Frequency of Communication with Friends and Family: More than three times a week    Frequency of Social Gatherings with Friends and Family: Never    Attends Religious Services: Never    Database administrator or Organizations: No    Attends Banker Meetings: Never    Marital Status: Married  Catering manager Violence: Not At Risk (01/12/2023)   Humiliation, Afraid, Rape, and Kick questionnaire    Fear of Current or Ex-Partner: No    Emotionally Abused: No    Physically Abused: No    Sexually Abused: No    Review of Systems: See HPI, otherwise negative ROS  Physical Exam: BP (!) 137/90   Temp (!) 96.8 F (36 C) (Temporal)   Resp 20   Ht 5\' 8"  (1.727 m)   Wt 93.4 kg   SpO2 98%   BMI 31.29 kg/m  General:   Alert,  pleasant and cooperative in NAD Head:  Normocephalic and atraumatic. Neck:  Supple; no masses or thyromegaly. Lungs:  Clear throughout to auscultation.    Heart:  Regular rate and rhythm. Abdomen:  Soft, nontender and nondistended. Normal bowel sounds, without guarding, and without rebound.   Neurologic:  Alert and  oriented x4;  grossly normal neurologically.  Impression/Plan: RAIF SARAFIN is here for an colonoscopy to be performed for cologuard positive  Risks, benefits, limitations, and alternatives regarding  colonoscopy have been reviewed with the patient.   Questions have been answered.  All parties agreeable.   Lannette Donath, MD  01/16/2023, 8:59 AM

## 2023-01-16 NOTE — Anesthesia Postprocedure Evaluation (Signed)
Anesthesia Post Note  Patient: Duane Grant  Procedure(s) Performed: COLONOSCOPY WITH PROPOFOL  Patient location during evaluation: Endoscopy Anesthesia Type: General Level of consciousness: awake and alert Pain management: pain level controlled Vital Signs Assessment: post-procedure vital signs reviewed and stable Respiratory status: spontaneous breathing, nonlabored ventilation and respiratory function stable Cardiovascular status: blood pressure returned to baseline and stable Postop Assessment: no apparent nausea or vomiting Anesthetic complications: no   No notable events documented.   Last Vitals:  Vitals:   01/16/23 1026 01/16/23 1036  BP: 104/65 119/79  Pulse: 69 68  Resp: 16 16  Temp:    SpO2: 100% 100%    Last Pain:  Vitals:   01/16/23 1036  TempSrc:   PainSc: 0-No pain                 Foye Deer

## 2023-01-16 NOTE — Transfer of Care (Signed)
Immediate Anesthesia Transfer of Care Note  Patient: Duane Grant  Procedure(s) Performed: COLONOSCOPY WITH PROPOFOL  Patient Location: Endoscopy Unit  Anesthesia Type:General  Level of Consciousness: drowsy  Airway & Oxygen Therapy: Patient Spontanous Breathing  Post-op Assessment: Report given to RN and Post -op Vital signs reviewed and stable  Post vital signs: Reviewed and stable  Last Vitals:  Vitals Value Taken Time  BP 107/73 01/16/23 1006  Temp 35.8 C 01/16/23 1006  Pulse 90 01/16/23 1006  Resp 23 01/16/23 1006  SpO2 95 % 01/16/23 1006    Last Pain:  Vitals:   01/16/23 1006  TempSrc: Temporal  PainSc: Asleep         Complications: No notable events documented.

## 2023-01-16 NOTE — Op Note (Signed)
[facility] Gastroenterology Patient Name: Duane Grant Procedure Date: 01/16/2023 9:01 AM MRN: [id number] Account #: [id number] Date of Birth: February 26, 1956 Admit Type: Outpatient Age: 67 Room: [facility] ENDO ROOM 4 Gender: Male Note Status: Finalized Instrument Name: Colonoscope [id number] Procedure:             Colonoscopy Indications:           This is the patient's first colonoscopy, Positive                         Cologuard test Providers:             Toney Reil MD, MD Medicines:             General Anesthesia Complications:         No immediate complications. Estimated blood loss: None. Procedure:             Pre-Anesthesia Assessment:                        - Prior to the procedure, a History and Physical was                         performed, and patient medications and allergies were                         reviewed. The patient is competent. The risks and                         benefits of the procedure and the sedation options and                         risks were discussed with the patient. All questions                         were answered and informed consent was obtained.                         Patient identification and proposed procedure were                         verified by the physician, the nurse, the                         anesthesiologist, the anesthetist and the technician                         in the pre-procedure area in the procedure room in the                         endoscopy suite. Mental Status Examination: alert and                         oriented. Airway Examination: normal oropharyngeal                         airway and neck mobility. Respiratory Examination:                         clear to auscultation. CV Examination:  normal.                         Prophylactic Antibiotics: The patient does not require                         prophylactic antibiotics. Prior Anticoagulants: The                         patient has taken  no anticoagulant or antiplatelet                         agents. ASA Grade Assessment: II - A patient with mild                         systemic disease. After reviewing the risks and                         benefits, the patient was deemed in satisfactory                         condition to undergo the procedure. The anesthesia                         plan was to use general anesthesia. Immediately prior                         to administration of medications, the patient was                         re-assessed for adequacy to receive sedatives. The                         heart rate, respiratory rate, oxygen saturations,                         blood pressure, adequacy of pulmonary ventilation, and                         response to care were monitored throughout the                         procedure. The physical status of the patient was                         re-assessed after the procedure.                        After obtaining informed consent, the colonoscope was                         passed under direct vision. Throughout the procedure,                         the patient's blood pressure, pulse, and oxygen                         saturations were monitored continuously. The  Colonoscope was introduced through the anus and                         advanced to the the cecum, identified by appendiceal                         orifice and ileocecal valve. The colonoscopy was                         performed with moderate difficulty due to significant                         looping. Successful completion of the procedure was                         aided by applying abdominal pressure. The patient                         tolerated the procedure well. The quality of the bowel                         preparation was evaluated using the BBPS [facility] Bowel                         Preparation Scale) with scores of: Right Colon = 3,                          Transverse Colon = 3 and Left Colon = 3 (entire mucosa                         seen well with no residual staining, small fragments                         of stool or opaque liquid). The total BBPS score                         equals 9. The ileocecal valve, appendiceal orifice,                         and rectum were photographed. Findings:      The perianal and digital rectal examinations were normal. Pertinent       negatives include normal sphincter tone and no palpable rectal lesions.      A 10 mm polyp was found in the transverse colon. The polyp was sessile.       The polyp was removed with a cold snare. Resection and retrieval were       complete. To prevent bleeding after the polypectomy, one hemostatic clip       was successfully placed (MR safe). Clip manufacturer: [employer].       There was no bleeding during, or at the end, of the procedure. Estimated       blood loss: none.      Two sessile polyps were found in the ascending colon. The polyps were 3       to 4 mm in size. These polyps were removed with a cold snare. Resection       and retrieval were complete. Estimated blood loss: none.  A 4 mm polyp was found in the distal rectum. The polyp was sessile. The       polyp was removed with a cold snare. Resection and retrieval were       complete.      A 20 mm polyp was found in the ileocecal valve. The polyp was flat.       Preparations were made for mucosal resection. Demarcation of the lesion       was performed with narrow band imaging to clearly identify the       boundaries of the lesion. Eleview was injected to raise the lesion.       Snare mucosal resection was performed. Resection was incomplete. The       resected tissue was retrieved. For post-intervention hemostasis,       hemostatic gel was deployed. There was no bleeding at the end of the       procedure.      The retroflexed view of the distal rectum and anal verge was normal and       showed no  anal or rectal abnormalities. Impression:            - One 10 mm polyp in the transverse colon, removed                         with a cold snare. Resected and retrieved. Clip                         manufacturer: [employer]. Clip (MR safe) was                         placed.                        - Two 3 to 4 mm polyps in the ascending colon, removed                         with a cold snare. Resected and retrieved.                        - One 4 mm polyp in the distal rectum, removed with a                         cold snare. Resected and retrieved.                        - One 20 mm polyp at the ileocecal valve, removed with                         mucosal resection. Incomplete resection. Resected                         tissue retrieved. Hemostatic spray applied.                        - The distal rectum and anal verge are normal on                         retroflexion view.                        -  Mucosal resection was performed. Resection was                         incomplete. The resected tissue was retrieved. Recommendation:        - Discharge patient to home (with escort).                        - Resume previous diet today.                        - Continue present medications.                        - Await pathology results.                        - Repeat colonoscopy with an advanced endoscopist at                         [facility] at appointment to be scheduled for resection of                         partially resected IC valve polyp with in next                         3-5months. Procedure Code(s):     --- Professional ---                        [phone], Colonoscopy, flexible; with endoscopic mucosal                         resection                        [phone], 59, Colonoscopy, flexible; with removal of                         tumor(s), polyp(s), or other lesion(s) by snare                         technique Diagnosis Code(s):     --- Professional ---                         D12.3, Benign neoplasm of transverse colon (hepatic                         flexure or splenic flexure)                        D12.8, Benign neoplasm of rectum                        D12.0, Benign neoplasm of cecum                        D12.2, Benign neoplasm of ascending colon                        R19.5, Other fecal abnormalities CPT copyright 2022 American Medical Association. All rights reserved. The codes documented in this report are preliminary  and upon coder review may  be revised to meet current compliance requirements. Dr. Libby Maw Toney Reil MD, MD 01/16/2023 10:07:17 AM This report has been signed electronically. Number of Addenda: 0 Note Initiated On: 01/16/2023 9:01 AM Scope Withdrawal Time: 0 hours 39 minutes 48 seconds  Total Procedure Duration: 0 hours 47 minutes 30 seconds  Estimated Blood Loss:  Estimated blood loss: none.      [facility]

## 2023-01-16 NOTE — OR Nursing (Signed)
PuraStat gel (Lot #: [id number], EXP 04/14/2025) used on polypectomy site at the IC valve.

## 2023-01-17 ENCOUNTER — Encounter: Payer: Self-pay | Admitting: Gastroenterology

## 2023-01-23 ENCOUNTER — Other Ambulatory Visit: Payer: Self-pay | Admitting: Family Medicine

## 2023-01-23 DIAGNOSIS — E1169 Type 2 diabetes mellitus with other specified complication: Secondary | ICD-10-CM

## 2023-01-23 NOTE — Telephone Encounter (Signed)
Medication Refill - Medication: Ozempic  Has the patient contacted their pharmacy? Yes.  They told him it needed a PA (Agent: If no, request that the patient contact the pharmacy for the refill. If patient does not wish to contact the pharmacy document the reason why and proceed with request.) (Agent: If yes, when and what did the pharmacy advise?)  Preferred Pharmacy (with phone number or street name): [facility]   [place] Has the patient been seen for an appointment in the last year OR does the patient have an upcoming appointment? Yes.    Agent: Please be advised that RX refills may take up to 3 business days. We ask that you follow-up with your pharmacy.

## 2023-01-24 MED ORDER — OZEMPIC (1 MG/DOSE) 4 MG/3ML ~~LOC~~ SOPN
1.0000 mg | PEN_INJECTOR | SUBCUTANEOUS | 3 refills | Status: DC
Start: 2023-01-24 — End: 2023-10-03

## 2023-01-24 NOTE — Telephone Encounter (Signed)
Requested medications are due for refill today.  no  Requested medications are on the active medications list.  yes  Last refill. 05/23/2022 9mL 3 rf  Future visit scheduled.   yes  Notes to clinic.  Note from pt states he needs a PA, but pt should not need refill.     Requested Prescriptions  Pending Prescriptions Disp Refills   OZEMPIC, 1 MG/DOSE, 4 MG/3ML SOPN 9 mL 3    Sig: Inject 1 mg as directed once a week.     Endocrinology:  Diabetes - GLP-1 Receptor Agonists - semaglutide Failed - 01/23/2023  9:06 AM      Failed - HBA1C in normal range and within 180 days    Hemoglobin A1C  Date Value Ref Range Status  10/23/2018 9.8  Final   Hgb A1c MFr Bld  Date Value Ref Range Status  11/21/2022 7.7 (H) <5.7 % of total Hgb Final    Comment:    For someone without known diabetes, a hemoglobin A1c value of 6.5% or greater indicates that they may have  diabetes and this should be confirmed with a follow-up  test. . For someone with known diabetes, a value <7% indicates  that their diabetes is well controlled and a value  greater than or equal to 7% indicates suboptimal  control. A1c targets should be individualized based on  duration of diabetes, age, comorbid conditions, and  other considerations. . Currently, no consensus exists regarding use of hemoglobin A1c for diagnosis of diabetes for children. .          Passed - Cr in normal range and within 360 days    Creat  Date Value Ref Range Status  11/21/2022 0.86 0.70 - 1.35 mg/dL Final   Creatinine, Urine  Date Value Ref Range Status  11/28/2022 15 (L) 20 - 320 mg/dL Final         Passed - Valid encounter within last 6 months    Recent Outpatient Visits           1 month ago Annual physical exam   [place] [facility], Netta Neat, DO   8 months ago Type 2 diabetes mellitus with other specified complication, without long-term current use of insulin [facility])   [place] [facility] Althea Charon, Netta Neat, DO   1 year ago Annual physical exam   [place] [facility] Smitty Cords, DO   1 year ago Type 2 diabetes mellitus with other specified complication, without long-term current use of insulin [facility])   [place] [facility] Smitty Cords, DO   2 years ago Annual physical exam   [place] [facility] Smitty Cords, DO       Future Appointments             In 4 months Al[place] Charon, Netta Neat, DO [facility], [facility]

## 2023-03-06 DIAGNOSIS — R931 Abnormal findings on diagnostic imaging of heart and coronary circulation: Secondary | ICD-10-CM | POA: Diagnosis not present

## 2023-03-06 DIAGNOSIS — I10 Essential (primary) hypertension: Secondary | ICD-10-CM | POA: Diagnosis not present

## 2023-03-06 DIAGNOSIS — R079 Chest pain, unspecified: Secondary | ICD-10-CM | POA: Diagnosis not present

## 2023-03-06 DIAGNOSIS — Z9189 Other specified personal risk factors, not elsewhere classified: Secondary | ICD-10-CM | POA: Diagnosis not present

## 2023-03-06 DIAGNOSIS — C61 Malignant neoplasm of prostate: Secondary | ICD-10-CM | POA: Diagnosis not present

## 2023-03-21 DIAGNOSIS — C61 Malignant neoplasm of prostate: Secondary | ICD-10-CM | POA: Diagnosis not present

## 2023-03-21 DIAGNOSIS — R232 Flushing: Secondary | ICD-10-CM | POA: Diagnosis not present

## 2023-03-21 DIAGNOSIS — M8589 Other specified disorders of bone density and structure, multiple sites: Secondary | ICD-10-CM | POA: Diagnosis not present

## 2023-03-21 DIAGNOSIS — Z87891 Personal history of nicotine dependence: Secondary | ICD-10-CM | POA: Diagnosis not present

## 2023-03-21 DIAGNOSIS — Z5111 Encounter for antineoplastic chemotherapy: Secondary | ICD-10-CM | POA: Diagnosis not present

## 2023-03-23 ENCOUNTER — Ambulatory Visit
Admission: EM | Admit: 2023-03-23 | Discharge: 2023-03-23 | Disposition: A | Discharge status: Home / Self Care | Payer: BC Managed Care – PPO | Attending: Emergency Medicine | Admitting: Emergency Medicine

## 2023-03-23 DIAGNOSIS — U071 COVID-19: Secondary | ICD-10-CM | POA: Insufficient documentation

## 2023-03-23 LAB — SARS CORONAVIRUS 2 BY RT PCR: SARS Coronavirus 2 by RT PCR: POSITIVE — AB

## 2023-03-23 MED ORDER — NIRMATRELVIR/RITONAVIR (PAXLOVID)TABLET: 3.0000 | ORAL_TABLET | Freq: Two times a day (BID) | ORAL | 0 refills | Status: AC

## 2023-03-23 MED ORDER — IBUPROFEN 600 MG PO TABS: 600.0000 mg | ORAL_TABLET | Freq: Three times a day (TID) | ORAL | 0 refills | Status: AC | PRN

## 2023-03-23 MED ORDER — PROMETHAZINE-DM 6.25-15 MG/5ML PO SYRP: 5.0000 mL | ORAL_SOLUTION | Freq: Four times a day (QID) | ORAL | 0 refills | Status: DC | PRN

## 2023-03-23 MED ORDER — IPRATROPIUM BROMIDE 0.06 % NA SOLN: 2.0000 | Freq: Four times a day (QID) | NASAL | 0 refills | Status: DC

## 2023-03-23 NOTE — ED Triage Notes (Signed)
Pt c/o covid exposure, body aches, cough x1day  Pt was around his wife who was diagnosed on Tuesday and pt states he began feeling bad on Wednesday.  Pt asks for a covid swab to be done.

## 2023-03-23 NOTE — ED Provider Notes (Signed)
HPI  SUBJECTIVE:  Duane Grant is a 67 y.o. male who presents with bodyaches, fatigue, fevers Tmax 101.2, dry cough starting yesterday.  He reports intermittent headache, nasal congestion, rhinorrhea, sore throat, postnasal drip, burning chest pain with coughing, nausea and shortness of breath with exertion.  He was unable to sleep last night due to the cough and bodyaches.  No wheezing, vomiting, diarrhea, abdominal pain.  His wife was diagnosed with COVID 2 days ago.  He got 4 doses of the COVID-vaccine.  He has been gargling salt water, using antiseptic spray for his throat, taking Tylenol and Advil, resting and pushing fluids.  The Tylenol and Advil help.  Symptoms worse with exertion.  He has a past medical history of diabetes, hypertension, hypercholesterolemia, prostate cancer.  No history of COVID, pulmonary disease, chronic kidney disease, coronary artery disease.  PCP: In [place].    Past Medical History:  Diagnosis Date   Arthritis    Cancer [facility]) 2009   Prostate, radiation but no surgery   Diabetes mellitus without complication (HCC)    Elevated PSA    H/O removal of cyst 12/02/2019   back    History of prostate cancer    Hyperlipidemia    Hypertension     Past Surgical History:  Procedure Laterality Date   COLONOSCOPY WITH PROPOFOL N/A 01/16/2023   Procedure: COLONOSCOPY WITH PROPOFOL;  Surgeon: Toney Reil, MD;  Location: [facility] ENDOSCOPY;  Service: Gastroenterology;  Laterality: N/A;   HERNIA REPAIR  2010   Umbilical   HERNIA REPAIR  2010   JOINT REPLACEMENT     Removal Fatty Tumor Right 2015   [facility], right side of chin/jaw   TOOTH EXTRACTION  12/15/2020   TOTAL HIP ARTHROPLASTY Left 06/09/2016   Procedure: TOTAL HIP ARTHROPLASTY;  Surgeon: Juanell Fairly, MD;  Location: [facility] ORS;  Service: Orthopedics;  Laterality: Left;    Family History  Problem Relation Age of Onset   Diabetes Mother    Hypertension Mother     Social History    Tobacco Use   Smoking status: Former    Current packs/day: 0.00    Average packs/day: 0.5 packs/day for 35.0 years (17.5 ttl pk-yrs)    Types: Cigarettes    Start date: 11/14/1959    Quit date: 11/14/1994    Years since quitting: 28.3   Smokeless tobacco: Former  Building services engineer status: Never Used  Substance Use Topics   Alcohol use: Yes    Comment: occassional,none last 24hrs   Drug use: No    No current facility-administered medications for this encounter.  Current Outpatient Medications:    aspirin EC 81 MG tablet, Take 81 mg by mouth daily. Swallow whole., Disp: , Rfl:    ibuprofen (ADVIL) 600 MG tablet, Take 1 tablet (600 mg total) by mouth every 8 (eight) hours as needed., Disp: 30 tablet, Rfl: 0   ipratropium (ATROVENT) 0.06 % nasal spray, Place 2 sprays into both nostrils 4 (four) times daily., Disp: 15 mL, Rfl: 0   lisinopril-hydrochlorothiazide (ZESTORETIC) 10-12.5 MG tablet, Take 0.5 tablets by mouth daily., Disp: 45 tablet, Rfl: 3   metFORMIN (GLUCOPHAGE) 500 MG tablet, Take 1 tablet (500 mg total) by mouth daily with supper., Disp: 90 tablet, Rfl: 3   nirmatrelvir/ritonavir (PAXLOVID) 20 x 150 MG & 10 x 100MG  TABS, Take 3 tablets by mouth 2 (two) times daily for 5 days. Patient GFR is 94. Take nirmatrelvir (150 mg) two tablets twice daily for  5 days and ritonavir (100 mg) one tablet twice daily for 5 days., Disp: 30 tablet, Rfl: 0   NUBEQA 300 MG tablet, Take 600 mg by mouth 2 (two) times daily., Disp: , Rfl:    Omega-3 Fatty Acids (FISH OIL) 1200 MG CAPS, Take 1 capsule by mouth 2 (two) times daily., Disp: , Rfl:    OZEMPIC, 1 MG/DOSE, 4 MG/3ML SOPN, Inject 1 mg as directed once a week., Disp: 9 mL, Rfl: 3   pravastatin (PRAVACHOL) 10 MG tablet, Take 1 tablet (10 mg total) by mouth daily., Disp: 90 tablet, Rfl: 3   promethazine-dextromethorphan (PROMETHAZINE-DM) 6.25-15 MG/5ML syrup, Take 5 mLs by mouth 4 (four) times daily as needed for cough., Disp: 118 mL, Rfl: 0    tamsulosin (FLOMAX) 0.4 MG CAPS capsule, Take 1 capsule (0.4 mg total) by mouth daily after supper., Disp: 90 capsule, Rfl: 3  No Known Allergies   ROS  As noted in HPI.   Physical Exam  BP 113/71 (BP Location: Left Arm)   Pulse 79   Temp (!) 101.2 F (38.4 C) (Oral)   Ht 5\' 8"  (1.727 m)   Wt 94.3 kg   SpO2 100%   BMI 31.63 kg/m   Constitutional: Well developed, well nourished, no acute distress Eyes:  EOMI, conjunctiva normal bilaterally HENT: Normocephalic, atraumatic,mucus membranes moist.  Positive nasal congestion.  Normal turbinates.  No sinus tenderness.  Normal oropharynx, normal tonsils without exudates.  Positive postnasal drip. Neck: Positive cervical lymphadenopathy Respiratory: Normal inspiratory effort, lungs clear bilaterally, good air movement Cardiovascular: Normal rate, regular rhythm, no murmurs rubs or gallops GI: nondistended skin: No rash, skin intact Musculoskeletal: no deformities Neurologic: Alert & oriented x 3, no focal neuro deficits Psychiatric: Speech and behavior appropriate   ED Course   Medications - No data to display  Orders Placed This Encounter  Procedures   SARS Coronavirus 2 by RT PCR (hospital order, performed in [facility] hospital lab) *cepheid single result test* Anterior Nasal Swab    Standing Status:   Standing    Number of Occurrences:   1   Airborne and Contact precautions    Standing Status:   Standing    Number of Occurrences:   1    Results for orders placed or performed during the hospital encounter of 03/23/23 (from the past 24 hour(s))  SARS Coronavirus 2 by RT PCR (hospital order, performed in [facility] hospital lab) *cepheid single result test* Anterior Nasal Swab     Status: Abnormal   Collection Time: 03/23/23  9:20 AM   Specimen: Anterior Nasal Swab  Result Value Ref Range   SARS Coronavirus 2 by RT PCR POSITIVE (A) NEGATIVE   No results found.  ED Clinical Impression  1. COVID-19 virus infection       ED Assessment/Plan    Patient presents with acute illness with systemic symptoms of fever.  He declined antipyretics here.  Patient qualifies for antivirals with a history of diabetes, hypertension, BMI above 30, hypercholesterolemia, and age above 48.  Deferring chest x-ray today as it would not change management, he is satting 100% on room air, and there were no focal lung findings.  GFR from labs done April 2024 94 mL/min.  Will send home with Paxlovid, Tylenol/ibuprofen, Atrovent nasal spray, Promethazine DM, saline nasal irrigation, work note for 3 days.  Patient to discontinue Flomax, pravastatin while taking the Paxlovid.  No other drug interactions.  Discussed labs,  MDM, treatment plan, and plan for follow-up  with patient. Discussed sn/sx that should prompt return to the ED. patient agrees with plan.   Meds ordered this encounter  Medications   nirmatrelvir/ritonavir (PAXLOVID) 20 x 150 MG & 10 x 100MG  TABS    Sig: Take 3 tablets by mouth 2 (two) times daily for 5 days. Patient GFR is 94. Take nirmatrelvir (150 mg) two tablets twice daily for 5 days and ritonavir (100 mg) one tablet twice daily for 5 days.    Dispense:  30 tablet    Refill:  0   ipratropium (ATROVENT) 0.06 % nasal spray    Sig: Place 2 sprays into both nostrils 4 (four) times daily.    Dispense:  15 mL    Refill:  0   promethazine-dextromethorphan (PROMETHAZINE-DM) 6.25-15 MG/5ML syrup    Sig: Take 5 mLs by mouth 4 (four) times daily as needed for cough.    Dispense:  118 mL    Refill:  0   ibuprofen (ADVIL) 600 MG tablet    Sig: Take 1 tablet (600 mg total) by mouth every 8 (eight) hours as needed.    Dispense:  30 tablet    Refill:  0      *This clinic note was created using Scientist, clinical (histocompatibility and immunogenetics). Therefore, there may be occasional mistakes despite careful proofreading.  ?    Domenick Gong, MD 03/23/23 1150

## 2023-03-23 NOTE — ED Triage Notes (Signed)
Pt has a temp of 101.2. Pt denies medication and states that he has tylenol at home.

## 2023-03-23 NOTE — Discharge Instructions (Addendum)
COVID is positive.  I am sending you home on Paxlovid.  Discontinue Flomax, pravastatin while taking the Paxlovid.  Start these back up when you finish the Paxlovid.  Take 600 mg of ibuprofen, and 1000 mg of Tylenol 3 times a day as needed for body aches, fevers, saline nasal irrigation with a NeilMed sinus rinse and distilled water, Mucinex, Atrovent nasal spray for the nasal congestion.  Promethazine DM for the cough.  I have decided to not do a chest x-ray as your lungs are clear, and it will not change my management.

## 2023-05-30 ENCOUNTER — Ambulatory Visit: Payer: BC Managed Care – PPO | Admitting: Family Medicine

## 2023-06-05 ENCOUNTER — Encounter: Payer: Self-pay | Admitting: Family Medicine

## 2023-06-05 ENCOUNTER — Ambulatory Visit: Payer: BC Managed Care – PPO | Admitting: Family Medicine

## 2023-06-05 ENCOUNTER — Other Ambulatory Visit: Payer: Self-pay | Admitting: Family Medicine

## 2023-06-05 VITALS — BP 130/78 | Ht 68.0 in | Wt 210.0 lb

## 2023-06-05 DIAGNOSIS — I10 Essential (primary) hypertension: Secondary | ICD-10-CM

## 2023-06-05 DIAGNOSIS — E1169 Type 2 diabetes mellitus with other specified complication: Secondary | ICD-10-CM

## 2023-06-05 DIAGNOSIS — E559 Vitamin D deficiency, unspecified: Secondary | ICD-10-CM

## 2023-06-05 DIAGNOSIS — G8929 Other chronic pain: Secondary | ICD-10-CM

## 2023-06-05 DIAGNOSIS — N401 Enlarged prostate with lower urinary tract symptoms: Secondary | ICD-10-CM | POA: Diagnosis not present

## 2023-06-05 DIAGNOSIS — M545 Low back pain, unspecified: Secondary | ICD-10-CM

## 2023-06-05 DIAGNOSIS — Z0000 Encounter for general adult medical examination without abnormal findings: Secondary | ICD-10-CM

## 2023-06-05 LAB — POCT GLYCOSYLATED HEMOGLOBIN (HGB A1C): Hemoglobin A1C: 7.6 % — AB (ref 4.0–5.6)

## 2023-06-05 MED ORDER — MELOXICAM 15 MG PO TABS
15.0000 mg | ORAL_TABLET | Freq: Every day | ORAL | 1 refills | Status: AC
Start: 2023-06-05 — End: ?

## 2023-06-05 MED ORDER — METHOCARBAMOL 500 MG PO TABS
500.0000 mg | ORAL_TABLET | Freq: Three times a day (TID) | ORAL | 3 refills | Status: AC | PRN
Start: 2023-06-05 — End: ?

## 2023-06-05 MED ORDER — LISINOPRIL-HYDROCHLOROTHIAZIDE 10-12.5 MG PO TABS: 0.5000 | ORAL_TABLET | Freq: Every day | ORAL | 3 refills | Status: AC

## 2023-06-05 MED ORDER — TAMSULOSIN HCL 0.4 MG PO CAPS
0.4000 mg | ORAL_CAPSULE | Freq: Every day | ORAL | 3 refills | Status: AC
Start: 2023-06-05 — End: ?

## 2023-06-05 MED ORDER — METFORMIN HCL 500 MG PO TABS
500.0000 mg | ORAL_TABLET | Freq: Every day | ORAL | 3 refills | Status: AC
Start: 2023-06-05 — End: ?

## 2023-06-05 NOTE — Assessment & Plan Note (Signed)
Controlled HTN - Home BP readings reviewed, normal  No known complications    Plan:  1. Continue current BP regimen -  Lisinopril-HCTZ 10-12.'5mg'$  (HALF pill) daily refill 2. Encourage improved lifestyle - low sodium diet, regular exercise 3. Continue monitor BP outside office, bring readings to next visit, if persistently >140/90 or new symptoms notify office sooner

## 2023-06-05 NOTE — Patient Instructions (Addendum)
Thank you for coming to the office today.  Recent Labs    11/21/22 0832 06/05/23 0810  HGBA1C 7.7* 7.6*   Keep on current dose Ozempic 1mg   Re order all meds  Flu Shot at [place], check status if you got it already or if you still need.  DUE for FASTING BLOOD WORK (no food or drink after midnight before the lab appointment, only water or coffee without cream/sugar on the morning of)  SCHEDULE "Lab Only" visit in the morning at the clinic for lab draw in 6 MONTHS   - Make sure Lab Only appointment is at about 1 week before your next appointment, so that results will be available  For Lab Results, once available within 2-3 days of blood draw, you can can log in to MyChart online to view your results and a brief explanation. Also, we can discuss results at next follow-up visit.   Please schedule a Follow-up Appointment to: Return in about 6 months (around 12/04/2023) for 6 month fasting lab only then 1 week later Annual Physical (early 1st AM Monday).  If you have any other questions or concerns, please feel free to call the office or send a message through MyChart. You may also schedule an earlier appointment if necessary.  Additionally, you may be receiving a survey about your experience at our office within a few days to 1 week by e-mail or mail. We value your feedback.  Saralyn Pilar, DO [facility], [place]

## 2023-06-05 NOTE — Assessment & Plan Note (Signed)
A1c down to 7.6 Improved on GLP1 but some side effects Admits some issue with prostate cancer treatment impacting his sugar Complications - hyperglycemia, hyperlipidemia - increases risk of future cardiovascular complications  - Failed Jardiance d/t yeast infection, off in 2020  Plan:  1. Continue Ozempic 1mg  weekly, Continue Metformin 500mg  nightly  Discussed the option of increasing Ozempic to 2mg , but decided to maintain current regimen given the patient's steady improvement and preference.  2. Encourage improved lifestyle - low carb, low sugar diet, reduce portion size, continue improving regular exercise 3. Check CBG , bring log to next visit for review 4. Continue ASA, ACEi, statin  DM Foot

## 2023-06-05 NOTE — Progress Notes (Signed)
Subjective:    Patient ID: Duane Grant, male    DOB: 31-Oct-1955, 67 y.o.   MRN: [id number]  Duane Grant is a 67 y.o. male presenting on 06/05/2023 for Medical Management of Chronic Issues and Diabetes   HPI  Discussed the use of AI scribe software for clinical note transcription with the patient, who gave verbal consent to proceed.       CHRONIC DM, Type 2: Hyperlipidemia  A1c down to 7.6, prior 7.7-7.9 range  Improved cholesterol on last lab now on Pravastatin Meds: Metformin 500mg  nightly with meals, Ozempic 1mg  weekly inj Reports good compliance. 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   Statin Myalgia Drug induced myopathy Hyperlipidemia Last lipid panel controlled Previously on Rosuvastatin 20mg  - had stopped this due to myalgia Continues on Pravastatin 10mg  with improvement.   Question if muscle aching is due to arthritis joints or statin or other such as Nubeqa   CHRONIC HTN: BP controlled Current Meds - Lisinopril-HCTZ 10-12.5mg  (HALF pill) daily  - has refills Reports good compliance, took meds today. Tolerating well, w/o complaints. Denies CP, dyspnea, HA, edema, dizziness / lightheadedness   Left Shoulder Pain, limited ROM He is still driving regularly.    Prostate Cancer Followed by by [facility]/oncology - Dr Lottie Dawson - S/p Radiation 2010, PSA was up to 19 in 08/2019, then treated with Trelstar (ADT) Currently taking Nubeqa, he has some side effects w chest pain and already evaluated by Cardiology he takes OTC Black Cohosh for hormonal symptoms. Still followed by [facility] On Flomax Last lab showed PSA 0     Chronic Low Back Pain [facility], has had Lumbar MRI -  PT regimen in past. - Taking Methocarbamol muscle relaxant AS NEEDED - Taking Meloxicam AS NEEDED - Also on Tylenol PRN   History of Recurrent Genital HSV No flare up recently. On Valtrex PRN only.    Reduced Hearing, Left ear Reports chronic gradual decline in hearing, Left ear is worse. He would like a hearing test.     Health Maintenance: UTD COVID19 vaccine 11/2019 and 12/2019 Pfizer completed   Shingrix vaccine updated.   Colon CA Screening: Colonoscopy completed [facility] Dr Allegra Lai, had multiple polyps, referred to [facility] for advanced endoscopy procedure.      06/05/2023    8:07 AM 01/12/2023    3:47 PM 11/28/2022    8:07 AM  Depression screen PHQ 2/9  Decreased Interest 2 0 0  Down, Depressed, Hopeless 0 0 0  PHQ - 2 Score 2 0 0  Altered sleeping 2 0 0  Tired, decreased energy 2 0 0  Change in appetite 0 0 0  Feeling bad or failure about yourself  0 0 0  Trouble concentrating 0 0 0  Moving slowly or fidgety/restless 0 0 0  Suicidal thoughts 0 0 0  PHQ-9 Score 6 0 0    Social History   Tobacco Use   Smoking status: Former    Current packs/day: 0.00    Average packs/day: 0.5 packs/day for 35.0 years (17.5 ttl pk-yrs)    Types: Cigarettes    Start date: 11/14/1959    Quit date: 11/14/1994    Years since quitting: 28.5   Smokeless tobacco: Former  Vaping Use   Vaping status: Never Used  Substance Use Topics   Alcohol use: Yes    Comment: occassional,none last 24hrs   Drug use: No    Review of Systems Per HPI unless specifically indicated above     Objective:    BP 130/78   Ht 5\' 8"  (1.727 m)   Wt 210 lb (95.3 kg)   BMI 31.93 kg/m   Wt Readings from Last 3 Encounters:  06/05/23 210 lb (95.3 kg)  03/23/23 208 lb (94.3 kg)  01/16/23 205 lb 12.8 oz (93.4 kg)    Physical Exam Vitals and nursing note reviewed.  Constitutional:      General: He is not in acute distress.    Appearance: He is well-developed. He is not diaphoretic.     Comments: Well-appearing,  comfortable, cooperative  HENT:     Head: Normocephalic and atraumatic.  Eyes:     General:        Right eye: No discharge.        Left eye: No discharge.     Conjunctiva/sclera: Conjunctivae normal.  Neck:     Thyroid: No thyromegaly.  Cardiovascular:     Rate and Rhythm: Normal rate and regular rhythm.     Pulses: Normal pulses.     Heart sounds: Normal heart sounds. No murmur heard. Pulmonary:     Effort: Pulmonary effort is normal. No respiratory distress.     Breath sounds: Normal breath sounds. No wheezing or rales.  Musculoskeletal:        General: Normal range of motion.     Cervical back: Normal range of motion and neck supple.  Lymphadenopathy:     Cervical: No cervical adenopathy.  Skin:    General: Skin is warm and dry.     Findings: No erythema or rash.  Neurological:     Mental Status: He is alert and oriented to person, place, and time. Mental status is at baseline.  Psychiatric:        Behavior: Behavior normal.     Comments: Well groomed, good eye contact, normal speech and thoughts      Diabetic Foot Exam - Simple   Simple Foot Form Diabetic Foot exam was performed with the following findings: Yes 06/05/2023  8:24 AM  Visual Inspection No deformities, no ulcerations, no other skin breakdown bilaterally: Yes Sensation Testing Intact to touch and monofilament testing bilaterally: Yes Pulse Check Posterior Tibialis and Dorsalis pulse intact bilaterally: Yes Comments    Recent Labs    11/21/22 0832 06/05/23 0810  HGBA1C 7.7* 7.6*    Results for orders placed or performed in visit on 06/05/23  POCT HgB A1C  Result Value Ref Range   Hemoglobin A1C 7.6 (A) 4.0 - 5.6 %   HbA1c POC (<> result, manual entry)     HbA1c, POC (prediabetic range)     HbA1c, POC (controlled diabetic range)        Assessment & Plan:   Problem List Items Addressed This Visit     Chronic bilateral low back pain without sciatica   Relevant Medications   methocarbamol  (ROBAXIN) 500 MG tablet   meloxicam (MOBIC) 15 MG tablet   Essential hypertension   Relevant Medications   lisinopril-hydrochlorothiazide (ZESTORETIC) 10-12.5 MG tablet   Type 2 diabetes mellitus with other specified complication (HCC) - Primary   Relevant Medications   metFORMIN (GLUCOPHAGE) 500 MG tablet   lisinopril-hydrochlorothiazide (ZESTORETIC) 10-12.5 MG tablet   Other Relevant Orders   POCT  HgB A1C (Completed)   Other Visit Diagnoses     Benign prostatic hyperplasia with lower urinary tract symptoms, symptom details unspecified       Relevant Medications   tamsulosin (FLOMAX) 0.4 MG CAPS capsule       Assessment and Plan      Musculoskeletal Pain Patient is on Meloxicam and Methocarbamol as needed for pain. Requested refills for both medications. -Refill Meloxicam and Methocarbamol for 90 days each.  Influenza Vaccination Uncertain if patient received the flu shot this year. Patient will check with the pharmacy and return if needed. -If not received, administer influenza vaccine.  General Health Maintenance / Followup Plans -Next appointment scheduled for April 2025 with fasting blood work to be done a week prior. -Patient needs an eye exam, encouraged to schedule.         Meds ordered this encounter  Medications   metFORMIN (GLUCOPHAGE) 500 MG tablet    Sig: Take 1 tablet (500 mg total) by mouth daily with supper.    Dispense:  90 tablet    Refill:  3   lisinopril-hydrochlorothiazide (ZESTORETIC) 10-12.5 MG tablet    Sig: Take 0.5 tablets by mouth daily.    Dispense:  45 tablet    Refill:  3   tamsulosin (FLOMAX) 0.4 MG CAPS capsule    Sig: Take 1 capsule (0.4 mg total) by mouth daily after supper.    Dispense:  90 capsule    Refill:  3   methocarbamol (ROBAXIN) 500 MG tablet    Sig: Take 1 tablet (500 mg total) by mouth 3 (three) times daily as needed.    Dispense:  90 tablet    Refill:  3   meloxicam (MOBIC) 15 MG tablet    Sig: Take 1 tablet (15  mg total) by mouth daily.    Dispense:  90 tablet    Refill:  1      Follow up plan: Return in about 6 months (around 12/04/2023) for 6 month fasting lab only then 1 week later Annual Physical (early 1st AM Monday).  Future labs ordered for 4/21./25   Urine micro with labs   Saralyn Pilar, DO [facility] 06/05/2023, 8:12 AM

## 2023-06-12 DIAGNOSIS — M1611 Unilateral primary osteoarthritis, right hip: Secondary | ICD-10-CM | POA: Diagnosis not present

## 2023-06-20 DIAGNOSIS — C61 Malignant neoplasm of prostate: Secondary | ICD-10-CM | POA: Diagnosis not present

## 2023-06-20 DIAGNOSIS — Z79818 Long term (current) use of other agents affecting estrogen receptors and estrogen levels: Secondary | ICD-10-CM | POA: Diagnosis not present

## 2023-07-10 DIAGNOSIS — Z0189 Encounter for other specified special examinations: Secondary | ICD-10-CM | POA: Diagnosis not present

## 2023-07-10 DIAGNOSIS — Z01818 Encounter for other preprocedural examination: Secondary | ICD-10-CM | POA: Diagnosis not present

## 2023-08-08 ENCOUNTER — Ambulatory Visit: Payer: BC Managed Care – PPO | Admitting: Physical Therapy

## 2023-08-10 DIAGNOSIS — M1611 Unilateral primary osteoarthritis, right hip: Secondary | ICD-10-CM | POA: Diagnosis not present

## 2023-08-10 DIAGNOSIS — C61 Malignant neoplasm of prostate: Secondary | ICD-10-CM | POA: Diagnosis not present

## 2023-08-10 DIAGNOSIS — I10 Essential (primary) hypertension: Secondary | ICD-10-CM | POA: Diagnosis not present

## 2023-08-10 DIAGNOSIS — Z6830 Body mass index (BMI) 30.0-30.9, adult: Secondary | ICD-10-CM | POA: Diagnosis not present

## 2023-08-10 DIAGNOSIS — Z7982 Long term (current) use of aspirin: Secondary | ICD-10-CM | POA: Diagnosis not present

## 2023-08-10 DIAGNOSIS — E669 Obesity, unspecified: Secondary | ICD-10-CM | POA: Diagnosis not present

## 2023-08-10 DIAGNOSIS — E119 Type 2 diabetes mellitus without complications: Secondary | ICD-10-CM | POA: Diagnosis not present

## 2023-08-10 DIAGNOSIS — M25751 Osteophyte, right hip: Secondary | ICD-10-CM | POA: Diagnosis not present

## 2023-08-10 DIAGNOSIS — E785 Hyperlipidemia, unspecified: Secondary | ICD-10-CM | POA: Diagnosis not present

## 2023-08-10 DIAGNOSIS — Z7985 Long-term (current) use of injectable non-insulin antidiabetic drugs: Secondary | ICD-10-CM | POA: Diagnosis not present

## 2023-08-10 DIAGNOSIS — Z96642 Presence of left artificial hip joint: Secondary | ICD-10-CM | POA: Diagnosis not present

## 2023-08-10 DIAGNOSIS — Z7984 Long term (current) use of oral hypoglycemic drugs: Secondary | ICD-10-CM | POA: Diagnosis not present

## 2023-08-11 DIAGNOSIS — Z7982 Long term (current) use of aspirin: Secondary | ICD-10-CM | POA: Diagnosis not present

## 2023-08-11 DIAGNOSIS — E119 Type 2 diabetes mellitus without complications: Secondary | ICD-10-CM | POA: Diagnosis not present

## 2023-08-11 DIAGNOSIS — Z6830 Body mass index (BMI) 30.0-30.9, adult: Secondary | ICD-10-CM | POA: Diagnosis not present

## 2023-08-11 DIAGNOSIS — E785 Hyperlipidemia, unspecified: Secondary | ICD-10-CM | POA: Diagnosis not present

## 2023-08-11 DIAGNOSIS — C61 Malignant neoplasm of prostate: Secondary | ICD-10-CM | POA: Diagnosis not present

## 2023-08-11 DIAGNOSIS — M1611 Unilateral primary osteoarthritis, right hip: Secondary | ICD-10-CM | POA: Diagnosis not present

## 2023-08-11 DIAGNOSIS — I10 Essential (primary) hypertension: Secondary | ICD-10-CM | POA: Diagnosis not present

## 2023-08-11 DIAGNOSIS — Z96642 Presence of left artificial hip joint: Secondary | ICD-10-CM | POA: Diagnosis not present

## 2023-08-11 DIAGNOSIS — Z7985 Long-term (current) use of injectable non-insulin antidiabetic drugs: Secondary | ICD-10-CM | POA: Diagnosis not present

## 2023-08-11 DIAGNOSIS — Z7984 Long term (current) use of oral hypoglycemic drugs: Secondary | ICD-10-CM | POA: Diagnosis not present

## 2023-08-11 DIAGNOSIS — E669 Obesity, unspecified: Secondary | ICD-10-CM | POA: Diagnosis not present

## 2023-08-11 DIAGNOSIS — M25751 Osteophyte, right hip: Secondary | ICD-10-CM | POA: Diagnosis not present

## 2023-08-15 ENCOUNTER — Encounter: Payer: BC Managed Care – PPO | Admitting: Physical Therapy

## 2023-08-17 ENCOUNTER — Encounter: Payer: BC Managed Care – PPO | Admitting: Physical Therapy

## 2023-08-21 ENCOUNTER — Ambulatory Visit: Payer: BC Managed Care – PPO | Attending: Orthopedic Surgery | Admitting: Physical Therapy

## 2023-08-21 ENCOUNTER — Encounter: Payer: BC Managed Care – PPO | Admitting: Physical Therapy

## 2023-08-21 DIAGNOSIS — M25651 Stiffness of right hip, not elsewhere classified: Secondary | ICD-10-CM | POA: Diagnosis not present

## 2023-08-21 DIAGNOSIS — M6281 Muscle weakness (generalized): Secondary | ICD-10-CM | POA: Diagnosis not present

## 2023-08-21 DIAGNOSIS — M25551 Pain in right hip: Secondary | ICD-10-CM | POA: Insufficient documentation

## 2023-08-21 DIAGNOSIS — Z96641 Presence of right artificial hip joint: Secondary | ICD-10-CM | POA: Insufficient documentation

## 2023-08-21 DIAGNOSIS — R269 Unspecified abnormalities of gait and mobility: Secondary | ICD-10-CM | POA: Diagnosis not present

## 2023-08-21 NOTE — Therapy (Signed)
 OUTPATIENT PHYSICAL THERAPY LOWER EXTREMITY EVALUATION  Patient Name: Duane Grant MRN: [id number] DOB:November 20, 1955, 68 y.o., male Today's Date: 08/21/2023  END OF SESSION:  PT End of Session - 08/21/23 1903     Visit Number 1    Number of Visits 16    Date for PT Re-Evaluation 10/16/23    PT Start Time 0940    PT Stop Time 1038    PT Time Calculation (min) 58 min             Past Medical History:  Diagnosis Date   Arthritis    Cancer (HCC) 2009   Prostate, radiation but no surgery   Diabetes mellitus without complication (HCC)    Elevated PSA    H/O removal of cyst 12/02/2019   back    History of prostate cancer    Hyperlipidemia    Hypertension    Past Surgical History:  Procedure Laterality Date   COLONOSCOPY WITH PROPOFOL  N/A 01/16/2023   Procedure: COLONOSCOPY WITH PROPOFOL ;  Surgeon: Unk Corinn Skiff, MD;  Location: [facility];  Service: Gastroenterology;  Laterality: N/A;   HERNIA REPAIR  2010   Umbilical   HERNIA REPAIR  2010   JOINT REPLACEMENT     Removal Fatty Tumor Right 2015   [facility], right side of chin/jaw   TOOTH EXTRACTION  12/15/2020   TOTAL HIP ARTHROPLASTY Left 06/09/2016   Procedure: TOTAL HIP ARTHROPLASTY;  Surgeon: Franky Cranker, MD;  Location: [facility];  Service: Orthopedics;  Laterality: Left;   Patient Active Problem List   Diagnosis Date Noted   Positive colorectal cancer screening using Cologuard test 01/16/2023   Cecal polyp 01/16/2023   Adenomatous polyp of colon 01/16/2023   Encounter for Department of Transportation (DOT) examination for trucking license [id number]/87/7977   Hearing loss, left 06/10/2020   Drug-induced myopathy 05/11/2020   Chronic bilateral low back pain without sciatica 12/23/2019   Hyperlipidemia associated with type 2 diabetes mellitus (HCC) 11/11/2019   Recurrent HSV (herpes simplex virus) 03/11/2019   Arthritis of carpometacarpal (CMC) joint of left thumb 03/11/2019   Obesity (BMI 30.0-34.9)  03/11/2019   Status post total hip replacement, left 06/09/2016   Hip pain 05/18/2016   Type 2 diabetes mellitus with other specified complication (HCC) 02/06/2014   Prostate cancer (HCC) 02/06/2014   Parotid mass 12/23/2013   Essential hypertension 12/06/2013   Herpesviral infection of urogenital system 12/06/2013   Erectile dysfunction 12/06/2013   Neck mass 12/06/2013    PCP: Edman Marsa PARAS, DO  REFERRING PROVIDER: Joshua Lin, PA-C  REFERRING DIAG: Presence of unspecified artificial hip joint   THERAPY DIAG:  History of total hip replacement, right  Hip joint stiffness, right  Muscle weakness (generalized)  Gait difficulty  Pain in right hip  Rationale for Evaluation and Treatment: Rehabilitation  ONSET DATE: 08/10/23  SUBJECTIVE:   SUBJECTIVE STATEMENT: Pt. S/p R posterior hip replacement on 08/10/23.  Pt. Returns to MD on 08/22/23 for bandage removal/ reassessment.  Pt. Reports 7-8/10 R hip pain at rest and is hoping to get more pain meds at MD f/u tomorrow.  Pt. Reports 3/10 R hip pain at best with ice/ medications.  Pt. Reports no falls and is currently using RW to ambulate.  Pt. Currently sleeping in recliner chair.    PERTINENT HISTORY: See MD note.  No L hip issues.    PAIN:  Are you having pain? Yes: NPRS scale: 7-8/10 Pain location: R hip Pain description: aching Aggravating factors: walking/ movement  Relieving factors: ice/ medications  PRECAUTIONS: Anterior hip  RED FLAGS: None   WEIGHT BEARING RESTRICTIONS: No  FALLS:  Has patient fallen in last 6 months? 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    PLOF: Independent  PATIENT GOALS: Increase R hip ROM/ strength/ ambulate with no assistive device.   Decrease pain/ promote return to work.    NEXT MD VISIT: 08/22/23  OBJECTIVE:  Note: Objective measures were completed at Evaluation unless otherwise noted.  DIAGNOSTIC FINDINGS: See MD notes  PATIENT SURVEYS:  FOTO initial 36/ goal 36  COGNITION: Overall cognitive status: Within functional limits for tasks assessed   SENSATION: WFL  EDEMA:  Moderate R lower leg swelling.  (+) pitting edema in R lower leg/ ankle.  Educated pt. On compression stockings/ ankle pumps/ elevating.    MUSCLE LENGTH: Hamstrings: Right 62 deg (pain); Left 60 deg  POSTURE: rounded shoulders and forward head  PALPATION: R lower leg/ ankle pitting edema.  Unable to palpate R hip secondary to bandage  LOWER EXTREMITY ROM:  Active ROM Right eval Left eval  Hip flexion 65 deg. (AAROM) 101 deg  Hip extension    Hip abduction 30 deg. 55 deg.  Hip adduction    Hip internal rotation    Hip external rotation    Knee flexion 82 deg. (Pain) 125 deg.  Knee extension 6 deg. 0 deg.  Ankle dorsiflexion    Ankle plantarflexion    Ankle inversion    Ankle eversion     (Blank rows = not tested)  LOWER EXTREMITY MMT:  MMT Right eval Left eval  Hip flexion 2/5 4/5  Hip extension    Hip abduction 3/5 4/5  Hip adduction    Hip internal rotation    Hip external rotation    Knee flexion 4-/5 5/5  Knee extension 4/5 pain 5/5  Ankle dorsiflexion 4/5 4/5  Ankle plantarflexion    Ankle inversion    Ankle eversion     (Blank rows = not tested)  LOWER EXTREMITY SPECIAL TESTS:  NT  FUNCTIONAL TESTS:  5 times sit to stand: TBD 6 minute walk test: TBD  GAIT: Distance walked: in clinic Assistive device utilized: Walker - 2 wheeled Level of assistance: Modified independence Comments: Moderate R antalgic gait pattern with limited R hip flexion/ step length.  Pt. Reports increase R hip pain with increase activity.                                                                                                                                TREATMENT DATE: 08/21/23   Evaluation/ reviewed HEP  PATIENT EDUCATION:  Education details: Discussed HEP ([id number]), ice pack.  Person educated: Patient and Spouse Education  method: Explanation, Demonstration, and Handouts Education comprehension: verbalized understanding and returned demonstration  HOME EXERCISE PROGRAM: Access Code: [email or web address] URL: [email or web address] Date: 08/21/2023 Prepared by: Duane Grant  Exercises - Standing March with Counter Support  - 2 x daily - 7 x weekly - 2 sets - 10 reps - Standing Hip Abduction with Unilateral Counter Support  - 2 x daily - 7 x weekly - 2 sets - 10 reps - Heel Raises with Counter Support  - 2 x daily - 7 x weekly - 2 sets - 10 reps - Supine Heel Slide  - 2 x daily - 7 x weekly - 2 sets - 10 reps - Supine Gluteal Sets  - 2 x daily - 7 x weekly - 2 sets - 10 reps - Supine Single Leg Ankle Pumps  - 2 x daily - 7 x weekly - 2 sets - 10 reps - Seated Knee Extension AROM  - 2 x daily - 7 x weekly - 2 sets - 10 reps  ASSESSMENT:  CLINICAL IMPRESSION: Patient is a pleasant 68 y.o. male who was seen today for physical therapy evaluation and treatment for R anterior hip replacement.  Pt. Reports 7-8/10 R hip pain at rest/ increase pain with walking.  Pt. Presents with marked R hip joint stiffness/hip muscle weakness.  Pt. Ambulates with moderate R antalgic gait pattern and use of RW for safety/ balance.  Pt. Will benefit from skilled PT services to increase R hip ROM/ strength to improve pain-free functional mobility/ return to work.     OBJECTIVE IMPAIRMENTS: Abnormal gait, decreased activity tolerance, decreased balance, decreased endurance, decreased mobility, difficulty walking, decreased ROM, decreased strength, increased edema, impaired flexibility, improper body mechanics, postural dysfunction, and pain.   ACTIVITY LIMITATIONS: carrying, lifting, bending, sitting, standing, squatting,  stairs, transfers, and locomotion level  PARTICIPATION LIMITATIONS: cleaning, driving, community activity, occupation, and yard work  PERSONAL FACTORS: Fitness and Past/current experiences are also affecting patient's functional outcome.   REHAB POTENTIAL: Good  CLINICAL DECISION MAKING: Stable/uncomplicated  EVALUATION COMPLEXITY: Low   GOALS: Goals reviewed with patient? Yes  SHORT TERM GOALS: Target date: 09/18/23 Pt. Independent with HEP to increase R hip AROM to WNL as compared to L hip to improve transfers/ gait with no assistive device.  Baseline:  see above Goal status: INITIAL  LONG TERM GOALS: Target date: 10/16/23 Pt. Will increase FOTO to 64 to improve pain-free functional mobility.   Baseline: initial 36 Goal status: INITIAL  2.  Pt. Will increase R hip strength to 4/5 MMT to improve ability to step into truck.   Baseline: see above Goal status: INITIAL  3.  Pt. Will ambulate community distances with normalized gait pattern and no increase c/o pain independently to promote return to work.   Baseline: Mod. I with RW/ moderate R antalgic gait.   Goal status: INITIAL  4.  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    Baseline: currently not able to work  Goal status: INITIAL  PLAN:  PT FREQUENCY: 2x/week  PT DURATION: 8 weeks  PLANNED INTERVENTIONS: 97110-Therapeutic exercises, 97530- Therapeutic activity, [id number]- Neuromuscular re-education, 97535- Self Care, [id number]- Manual therapy, [id number]- Gait training, Patient/Family education, Balance training, Stair training, Cryotherapy, and Moist heat  PLAN FOR NEXT SESSION:  Review HEP/ progress gait pattern  Duane Grant, PT, DPT # [phone] 08/21/2023, 7:06 PM

## 2023-08-23 ENCOUNTER — Encounter: Payer: BC Managed Care – PPO | Admitting: Physical Therapy

## 2023-08-23 ENCOUNTER — Ambulatory Visit: Payer: BC Managed Care – PPO | Admitting: Physical Therapy

## 2023-08-23 DIAGNOSIS — M25651 Stiffness of right hip, not elsewhere classified: Secondary | ICD-10-CM | POA: Diagnosis not present

## 2023-08-23 DIAGNOSIS — R269 Unspecified abnormalities of gait and mobility: Secondary | ICD-10-CM | POA: Diagnosis not present

## 2023-08-23 DIAGNOSIS — M6281 Muscle weakness (generalized): Secondary | ICD-10-CM

## 2023-08-23 DIAGNOSIS — M25551 Pain in right hip: Secondary | ICD-10-CM

## 2023-08-23 DIAGNOSIS — Z96641 Presence of right artificial hip joint: Secondary | ICD-10-CM

## 2023-08-23 NOTE — Therapy (Addendum)
 OUTPATIENT PHYSICAL THERAPY LOWER EXTREMITY TREATMENT  Patient Name: Duane Grant MRN: [id number] DOB:[birth year], 68 y.o., male Today's Date: 08/23/2023  END OF SESSION:  PT End of Session - 08/23/23 0800     Visit Number 2    Number of Visits 16    Date for PT Re-Evaluation 10/16/23    PT Start Time 0800    PT Stop Time 0917    PT Time Calculation (min) 77 min            Past Medical History:  Diagnosis Date   Arthritis    Cancer (HCC) 2009   Prostate, radiation but no surgery   Diabetes mellitus without complication (HCC)    Elevated PSA    H/O removal of cyst 12/02/2019   back    History of prostate cancer    Hyperlipidemia    Hypertension    Past Surgical History:  Procedure Laterality Date   COLONOSCOPY WITH PROPOFOL  N/A 01/16/2023   Procedure: COLONOSCOPY WITH PROPOFOL ;  Surgeon: Unk Corinn Skiff, MD;  Location: [facility];  Service: Gastroenterology;  Laterality: N/A;   HERNIA REPAIR  2010   Umbilical   HERNIA REPAIR  2010   JOINT REPLACEMENT     Removal Fatty Tumor Right 2015   [facility], right side of chin/jaw   TOOTH EXTRACTION  12/15/2020   TOTAL HIP ARTHROPLASTY Left 06/09/2016   Procedure: TOTAL HIP ARTHROPLASTY;  Surgeon: Franky Cranker, MD;  Location: [facility];  Service: Orthopedics;  Laterality: Left;   Patient Active Problem List   Diagnosis Date Noted   Positive colorectal cancer screening using Cologuard test 01/16/2023   Cecal polyp 01/16/2023   Adenomatous polyp of colon 01/16/2023   Encounter for Department of Transportation (DOT) examination for trucking license [id number]/87/7977   Hearing loss, left 06/10/2020   Drug-induced myopathy 05/11/2020   Chronic bilateral low back pain without sciatica 12/23/2019   Hyperlipidemia associated with type 2 diabetes mellitus (HCC) 11/11/2019   Recurrent HSV (herpes simplex virus) 03/11/2019   Arthritis of carpometacarpal (CMC) joint of left thumb 03/11/2019   Obesity (BMI 30.0-34.9)  03/11/2019   Status post total hip replacement, left 06/09/2016   Hip pain 05/18/2016   Type 2 diabetes mellitus with other specified complication (HCC) 02/06/2014   Prostate cancer (HCC) 02/06/2014   Parotid mass 12/23/2013   Essential hypertension 12/06/2013   Herpesviral infection of urogenital system 12/06/2013   Erectile dysfunction 12/06/2013   Neck mass 12/06/2013    PCP: Edman Marsa PARAS, DO  REFERRING PROVIDER: Joshua Lin, PA-C  REFERRING DIAG: Presence of unspecified artificial hip joint   THERAPY DIAG:  History of total hip replacement, right  Hip joint stiffness, right  Muscle weakness (generalized)  Gait difficulty  Pain in right hip  Rationale for Evaluation and Treatment: Rehabilitation  ONSET DATE: 08/10/23  SUBJECTIVE:   SUBJECTIVE STATEMENT: Pt. S/p R posterior hip replacement on 08/10/23.  Pt. Returns to MD on 08/22/23 for bandage removal/ reassessment.  Pt. Reports 7-8/10 R hip pain at rest and is hoping to get more pain meds at MD f/u tomorrow.  Pt. Reports 3/10 R hip pain at best with ice/ medications.  Pt. Reports no falls and is currently using RW to ambulate.  Pt. Currently sleeping in recliner chair.    PERTINENT HISTORY: See MD note.  No L hip issues.    PAIN:  Are you having pain? Yes: NPRS scale: 7-8/10 Pain location: R hip Pain description: aching Aggravating factors: walking/ movement Relieving  factors: ice/ medications  PRECAUTIONS: Anterior hip  RED FLAGS: None   WEIGHT BEARING RESTRICTIONS: No  FALLS:  Has patient fallen in last 6 months? 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    PLOF: Independent  PATIENT GOALS: Increase R hip ROM/ strength/ ambulate with no assistive device.   Decrease pain/ promote return to work.    NEXT MD VISIT: 08/22/23  OBJECTIVE:  Note: Objective measures were completed at Evaluation unless otherwise noted.  DIAGNOSTIC FINDINGS: See MD notes  PATIENT SURVEYS:  FOTO initial 36/ goal 49  COGNITION: Overall cognitive status: Within functional limits for tasks assessed   SENSATION: WFL  EDEMA:  Moderate R lower leg swelling.  (+) pitting edema in R lower leg/ ankle.  Educated pt. On compression stockings/ ankle pumps/ elevating.    MUSCLE LENGTH: Hamstrings: Right 62 deg (pain); Left 60 deg  POSTURE: rounded shoulders and forward head  PALPATION: R lower leg/ ankle pitting edema.  Unable to palpate R hip secondary to bandage  LOWER EXTREMITY ROM:  Active ROM Right eval Left eval  Hip flexion 65 deg. (AAROM) 101 deg  Hip extension    Hip abduction 30 deg. 55 deg.  Hip adduction    Hip internal rotation    Hip external rotation    Knee flexion 82 deg. (Pain) 125 deg.  Knee extension 6 deg. 0 deg.  Ankle dorsiflexion    Ankle plantarflexion    Ankle inversion    Ankle eversion     (Blank rows = not tested)  LOWER EXTREMITY MMT:  MMT Right eval Left eval  Hip flexion 2/5 4/5  Hip extension    Hip abduction 3/5 4/5  Hip adduction    Hip internal rotation    Hip external rotation    Knee flexion 4-/5 5/5  Knee extension 4/5 pain 5/5  Ankle dorsiflexion 4/5 4/5  Ankle plantarflexion    Ankle inversion    Ankle eversion     (Blank rows = not tested)  LOWER EXTREMITY SPECIAL TESTS:  NT  FUNCTIONAL TESTS:  5 times sit to stand: TBD 6 minute walk test: TBD  GAIT: Distance walked: in clinic Assistive device utilized: Walker - 2 wheeled Level of assistance: Modified independence Comments: Moderate R antalgic gait pattern with limited R hip flexion/ step length.  Pt. Reports increase R hip pain with increase activity.                                                                                                                               TREATMENT DATE: 08/23/2023  Subjective:  Pt. Reports 4/10 R hip pain this morning.  Pt. States MD removed bandage and prescribed more pain medications.  Pt. Returns  to MD in 6 weeks. Pt. emphasizes significant ROM improvements since previous visit.   PT assessed R hip incision.  Steristrips in place.  Good incision healing.   There.ex.:  Supine PROM hip flexion  Supine hook lying static SLR with ankle pumps (5 minutes)  Nu-Step 10 minutes at L3/seat 9.  B LE only.  No increase c/o pain.    //-Bar walking, focus of increase heel-strike to toe-off/ consistent recip. Step pattern with light to no UE assist.  10 laps with mirror feedback. NPS increase from 4/10 to 5/10 and RPE 4 after completion.   //-Bar standing B hip flexion focus of increase balance. 20 Reps per leg with mirror feedback. NPS 5/10 and RPE 4 after completion.  //-Bar standing B hip abduction. 40 Reps per leg with mirror feedback and verbal glut. med. activation cuing. NPS 6/10 and RPE 4.   //-Bar standing heel raises. 4 sets of 10 reps. NPS 6/10 and RPE 4.  B Staircase step up with heel raise with B handrail support. 40 Reps per leg. NPS 4/10 RPE 6.  Supine heel slides AAROM 20x/ AROM 10x2.  Reviewed HEP  Supine hamstring stretches with holds.  3x10    PATIENT EDUCATION:  Education details: Discussed HEP ([id number]), ice pack.  Person educated: Patient and Spouse Education method: Explanation, Demonstration, and Handouts Education comprehension: verbalized understanding and returned demonstration  HOME EXERCISE PROGRAM: Access Code: [email or web address] URL: [email or web address] Date: 08/21/2023 Prepared by: Ozell Sero  Exercises - Standing March with Counter Support  - 2 x daily - 7 x weekly - 2 sets - 10 reps - Standing Hip Abduction with Unilateral Counter Support  - 2 x daily - 7 x weekly - 2 sets - 10 reps - Heel Raises with Counter Support  - 2 x daily - 7 x weekly -  2 sets - 10 reps - Supine Heel Slide  - 2 x daily - 7 x weekly - 2 sets - 10 reps - Supine Gluteal Sets  - 2 x daily - 7 x weekly - 2 sets - 10 reps - Supine Single Leg Ankle Pumps  - 2 x daily - 7 x weekly - 2 sets - 10 reps - Seated Knee Extension AROM  - 2 x daily - 7 x weekly - 2 sets - 10 reps.  ASSESSMENT:  CLINICAL IMPRESSION: Pt. Reports less R hip pain today at rest (4/10).  Pt. Understands current HEP and demonstrates increase R hip/ knee control. Pt. Ambulates with less R antalgic gait pattern with use of RW for safety/ balance. Pt. Was highly compliant with all exercises and interventions performed during today's session. Pt. Progressed exercises with step ups during today's session. Pt. Will benefit from skilled PT services to increase R hip ROM/ strength to improve pain-free functional mobility/ return to work.     OBJECTIVE IMPAIRMENTS: Abnormal gait, decreased activity tolerance, decreased balance, decreased endurance, decreased mobility, difficulty walking, decreased ROM, decreased strength, increased edema, impaired flexibility, improper body mechanics, postural dysfunction, and pain.   ACTIVITY LIMITATIONS: carrying, lifting, bending, sitting, standing, squatting, stairs, transfers, and locomotion level  PARTICIPATION LIMITATIONS: cleaning, driving, community activity, occupation, and yard work  PERSONAL FACTORS: Fitness and Past/current experiences are also affecting patient's functional outcome.   REHAB POTENTIAL: Good  CLINICAL DECISION MAKING: Stable/uncomplicated  EVALUATION COMPLEXITY: Low   GOALS: Goals reviewed with patient? Yes  SHORT TERM GOALS: Target date: 09/18/23 Pt. Independent with HEP to increase R hip AROM to WNL as compared to  L hip to improve transfers/ gait with no assistive device.  Baseline:  see above Goal status: INITIAL  LONG TERM GOALS: Target date: 10/16/23 Pt. Will increase FOTO to 64 to improve pain-free functional mobility.   Baseline:  initial 36 Goal status: INITIAL  2.  Pt. Will increase R hip strength to 4/5 MMT to improve ability to step into truck.   Baseline: see above Goal status: INITIAL  3.  Pt. Will ambulate community distances with normalized gait pattern and no increase c/o pain independently to promote return to work.   Baseline: Mod. I with RW/ moderate R antalgic gait.   Goal status: INITIAL  4.  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    Baseline: currently not able to work  Goal status: INITIAL  PLAN:  PT FREQUENCY: 2x/week  PT DURATION: 8 weeks  PLANNED INTERVENTIONS: 97110-Therapeutic exercises, 97530- Therapeutic activity, [id number]- Neuromuscular re-education, 97535- Self Care, [id number]- Manual therapy, [phone]- Gait training, Patient/Family education, Balance training, Stair training, Cryotherapy, and Moist heat  PLAN FOR NEXT SESSION:  Review HEP/ progress gait pattern  Ozell JAYSON Sero, PT, DPT # [phone] Beverley Bunker, SPT 08/23/2023, 9:29 AM

## 2023-08-28 ENCOUNTER — Encounter: Payer: BC Managed Care – PPO | Admitting: Physical Therapy

## 2023-08-29 ENCOUNTER — Encounter: Payer: Self-pay | Admitting: Physical Therapy

## 2023-08-29 ENCOUNTER — Ambulatory Visit: Payer: BC Managed Care – PPO | Admitting: Physical Therapy

## 2023-08-29 DIAGNOSIS — R269 Unspecified abnormalities of gait and mobility: Secondary | ICD-10-CM

## 2023-08-29 DIAGNOSIS — M25651 Stiffness of right hip, not elsewhere classified: Secondary | ICD-10-CM | POA: Diagnosis not present

## 2023-08-29 DIAGNOSIS — M25551 Pain in right hip: Secondary | ICD-10-CM | POA: Diagnosis not present

## 2023-08-29 DIAGNOSIS — Z96641 Presence of right artificial hip joint: Secondary | ICD-10-CM

## 2023-08-29 DIAGNOSIS — M6281 Muscle weakness (generalized): Secondary | ICD-10-CM

## 2023-08-29 NOTE — Therapy (Signed)
 OUTPATIENT PHYSICAL THERAPY LOWER EXTREMITY TREATMENT  Patient Name: Duane Grant MRN: [id number] DOB:1955-10-18, 68 y.o., male Today's Date: 08/29/2023  END OF SESSION:  PT End of Session - 08/29/23 0759     Visit Number 3    Number of Visits 16    Date for PT Re-Evaluation 10/16/23    PT Start Time 0759    PT Stop Time 0901    PT Time Calculation (min) 62 min            Past Medical History:  Diagnosis Date   Arthritis    Cancer (HCC) 2009   Prostate, radiation but no surgery   Diabetes mellitus without complication (HCC)    Elevated PSA    H/O removal of cyst 12/02/2019   back    History of prostate cancer    Hyperlipidemia    Hypertension    Past Surgical History:  Procedure Laterality Date   COLONOSCOPY WITH PROPOFOL  N/A 01/16/2023   Procedure: COLONOSCOPY WITH PROPOFOL ;  Surgeon: Unk Corinn Skiff, MD;  Location: [facility];  Service: Gastroenterology;  Laterality: N/A;   HERNIA REPAIR  2010   Umbilical   HERNIA REPAIR  2010   JOINT REPLACEMENT     Removal Fatty Tumor Right 2015   [facility], right side of chin/jaw   TOOTH EXTRACTION  12/15/2020   TOTAL HIP ARTHROPLASTY Left 06/09/2016   Procedure: TOTAL HIP ARTHROPLASTY;  Surgeon: Franky Cranker, MD;  Location: [facility] OR[facility];  Service: Orthopedics;  Laterality: Left;   Patient Active Problem List   Diagnosis Date Noted   Positive colorectal cancer screening using Cologuard test 01/16/2023   Cecal polyp 01/16/2023   Adenomatous polyp of colon 01/16/2023   Encounter for Department of Transportation (DOT) examination for trucking license [id number]/87/7977   Hearing loss, left 06/10/2020   Drug-induced myopathy 05/11/2020   Chronic bilateral low back pain without sciatica 12/23/2019   Hyperlipidemia associated with type 2 diabetes mellitus (HCC) 11/11/2019   Recurrent HSV (herpes simplex virus) 03/11/2019   Arthritis of carpometacarpal (CMC) joint of left thumb 03/11/2019   Obesity (BMI 30.0-34.9)  03/11/2019   Status post total hip replacement, left 06/09/2016   Hip pain 05/18/2016   Type 2 diabetes mellitus with other specified complication (HCC) 02/06/2014   Prostate cancer (HCC) 02/06/2014   Parotid mass 12/23/2013   Essential hypertension 12/06/2013   Herpesviral infection of urogenital system 12/06/2013   Erectile dysfunction 12/06/2013   Neck mass 12/06/2013    PCP: Edman Marsa PARAS, DO  REFERRING PROVIDER: Joshua Lin, PA-C  REFERRING DIAG: Presence of unspecified artificial hip joint   THERAPY DIAG:  History of total hip replacement, right  Hip joint stiffness, right  Muscle weakness (generalized)  Gait difficulty  Rationale for Evaluation and Treatment: Rehabilitation  ONSET DATE: 08/10/23  SUBJECTIVE:   SUBJECTIVE STATEMENT: Pt. S/p R posterior hip replacement on 08/10/23.  Pt. Returns to MD on 08/22/23 for bandage removal/ reassessment.  Pt. Reports 7-8/10 R hip pain at rest and is hoping to get more pain meds at MD f/u tomorrow.  Pt. Reports 3/10 R hip pain at best with ice/ medications.  Pt. Reports no falls and is currently using RW to ambulate.  Pt. Currently sleeping in recliner chair.    PERTINENT HISTORY: See MD note.  No L hip issues.    PAIN:  Are you having pain? Yes: NPRS scale: 7-8/10 Pain location: R hip Pain description: aching Aggravating factors: walking/ movement Relieving factors: ice/ medications  PRECAUTIONS:  Anterior hip  RED FLAGS: None   WEIGHT BEARING RESTRICTIONS: No  FALLS:  Has patient fallen in last 6 months? 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    PLOF: Independent  PATIENT GOALS: Increase R hip ROM/ strength/ ambulate with no assistive device.  Decrease pain/ promote  return to work.    NEXT MD VISIT: 08/22/23  OBJECTIVE:  Note: Objective measures were completed at Evaluation unless otherwise noted.  DIAGNOSTIC FINDINGS: See MD notes  PATIENT SURVEYS:  FOTO initial 36/ goal 18  COGNITION: Overall cognitive status: Within functional limits for tasks assessed   SENSATION: WFL  EDEMA:  Moderate R lower leg swelling.  (+) pitting edema in R lower leg/ ankle.  Educated pt. On compression stockings/ ankle pumps/ elevating.    MUSCLE LENGTH: Hamstrings: Right 62 deg (pain); Left 60 deg  POSTURE: rounded shoulders and forward head  PALPATION: R lower leg/ ankle pitting edema.  Unable to palpate R hip secondary to bandage  LOWER EXTREMITY ROM:  Active ROM Right eval Left eval  Hip flexion 65 deg. (AAROM) 101 deg  Hip extension    Hip abduction 30 deg. 55 deg.  Hip adduction    Hip internal rotation    Hip external rotation    Knee flexion 82 deg. (Pain) 125 deg.  Knee extension 6 deg. 0 deg.  Ankle dorsiflexion    Ankle plantarflexion    Ankle inversion    Ankle eversion     (Blank rows = not tested)  LOWER EXTREMITY MMT:  MMT Right eval Left eval  Hip flexion 2/5 4/5  Hip extension    Hip abduction 3/5 4/5  Hip adduction    Hip internal rotation    Hip external rotation    Knee flexion 4-/5 5/5  Knee extension 4/5 pain 5/5  Ankle dorsiflexion 4/5 4/5  Ankle plantarflexion    Ankle inversion    Ankle eversion     (Blank rows = not tested)  LOWER EXTREMITY SPECIAL TESTS:  NT  FUNCTIONAL TESTS:  5 times sit to stand: TBD 6 minute walk test: TBD  GAIT: Distance walked: in clinic Assistive device utilized: Walker - 2 wheeled Level of assistance: Modified independence Comments: Moderate R antalgic gait pattern with limited R hip flexion/ step length.  Pt. Reports increase R hip pain with increase activity.                                                                                                                               TREATMENT DATE: 08/29/2023  Subjective:  Pt. Reports no R hip pain this morning, just stiffness.  Pt. Has been active over weekend and able to pick R hip/LE out of passenger side  of car with no issues.  Pt. Returns to MD in 5 weeks.    There.ex.:  Nu-Step 10 minutes at L4/seat 10-9.  B LE only.  No increase c/o pain.    //-Bar walking, focus of increase heel-strike to toe-off/ consistent recip. Step pattern with light to no UE assist.  10 laps with mirror feedback. NPS increase from 4/10 to 5/10 and RPE 4 after completion.  Recip. Stairs with light UE assist on handrails 4 steps x 4.   Resisted gait 2BTB 8x all 4-planes (progressing to no UE assist) in //-bars.      Walking in hallway with use of SPC and recip. Gait pattern.  No active SLR on blue mat table.  Supine R hip marching 10x2.      Supine heel slides A/AROM 20x/ bridging 10x2.  Reviewed HEP (added squats/ step ups)  Supine hamstring/ hip abduction/ adduction stretches with holds.  3x10.  Sit to stands from low blue mat table 10x2.  Good technique/ cuing to prevent L lateral lean.      PATIENT EDUCATION:  Education details: Discussed HEP ([id number]), ice pack.  Person educated: Patient and Spouse Education method: Explanation, Demonstration, and Handouts Education comprehension: verbalized understanding and returned demonstration  HOME EXERCISE PROGRAM: Access Code: [email or web address] URL: [email or web address] Date: 08/21/2023 Prepared by: Ozell Sero  Exercises - Standing March with Counter Support  - 2 x daily - 7 x weekly - 2 sets - 10 reps - Standing Hip Abduction with Unilateral Counter Support  - 2 x daily - 7 x weekly - 2 sets - 10 reps - Heel Raises with Counter Support  - 2 x daily - 7 x weekly - 2 sets - 10 reps - Supine Heel Slide  - 2 x daily - 7 x weekly - 2 sets - 10 reps - Supine Gluteal Sets  - 2 x daily - 7 x weekly - 2 sets - 10 reps - Supine Single Leg Ankle Pumps  - 2 x daily - 7  x weekly - 2 sets - 10 reps - Seated Knee Extension AROM  - 2 x daily - 7 x weekly - 2 sets - 10 reps.  ASSESSMENT:  CLINICAL IMPRESSION: Pt. Reports minimal R hip pain today and enters PT with use of RW.  Pt. Understands current HEP and demonstrates increase R hip/ knee control.  Pt. Unable to complete an active SLR on blue mat table with goal to be able to complete 10x with no assist.  Pt. Ambulates with less R antalgic gait pattern with use of RW for safety/ balance and has progressed to [facility]. Pt. Was highly compliant with all exercises and interventions performed during today's session.  Pt. Will benefit from skilled PT services to increase R hip ROM/ strength to improve pain-free functional mobility/ return to work.     OBJECTIVE IMPAIRMENTS: Abnormal gait, decreased activity tolerance, decreased balance, decreased endurance, decreased mobility, difficulty walking, decreased ROM, decreased strength, increased edema, impaired flexibility, improper body mechanics, postural dysfunction, and pain.   ACTIVITY LIMITATIONS: carrying, lifting, bending, sitting, standing, squatting, stairs, transfers, and locomotion level  PARTICIPATION LIMITATIONS: cleaning, driving, community activity, occupation, and yard work  PERSONAL FACTORS: Fitness and Past/current experiences are also affecting patient's functional outcome.   REHAB POTENTIAL: Good  CLINICAL DECISION MAKING: Stable/uncomplicated  EVALUATION COMPLEXITY: Low   GOALS: Goals reviewed with patient? Yes  SHORT TERM GOALS: Target date: 09/18/23 Pt. Independent with HEP to increase R hip AROM to WNL as compared to  L hip to improve transfers/ gait with no assistive device.  Baseline:  see above Goal status: INITIAL  LONG TERM GOALS: Target date: 10/16/23 Pt. Will increase FOTO to 64 to improve pain-free functional mobility.   Baseline: initial 36 Goal status: INITIAL  2.  Pt. Will increase R hip strength to 4/5 MMT to improve ability to  step into truck.   Baseline: see above Goal status: INITIAL  3.  Pt. Will ambulate community distances with normalized gait pattern and no increase c/o pain independently to promote return to work.   Baseline: Mod. I with RW/ moderate R antalgic gait.   Goal status: INITIAL  4.  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    Baseline: currently not able to work  Goal status: INITIAL  PLAN:  PT FREQUENCY: 2x/week  PT DURATION: 8 weeks  PLANNED INTERVENTIONS: 97110-Therapeutic exercises, 97530- Therapeutic activity, [id number]- Neuromuscular re-education, 97535- Self Care, [id number]- Manual therapy, [phone]- Gait training, Patient/Family education, Balance training, Stair training, Cryotherapy, and Moist heat  PLAN FOR NEXT SESSION:  CHECK incision/ progress HEP   Ozell JAYSON Sero, PT, DPT # [id number] Beverley Bunker, SPT 08/29/2023, 9:08 AM

## 2023-08-30 ENCOUNTER — Encounter: Payer: BC Managed Care – PPO | Admitting: Physical Therapy

## 2023-08-31 ENCOUNTER — Encounter: Payer: Self-pay | Admitting: Physical Therapy

## 2023-08-31 ENCOUNTER — Ambulatory Visit: Payer: BC Managed Care – PPO | Admitting: Physical Therapy

## 2023-08-31 DIAGNOSIS — M25651 Stiffness of right hip, not elsewhere classified: Secondary | ICD-10-CM | POA: Diagnosis not present

## 2023-08-31 DIAGNOSIS — M25551 Pain in right hip: Secondary | ICD-10-CM | POA: Diagnosis not present

## 2023-08-31 DIAGNOSIS — Z96641 Presence of right artificial hip joint: Secondary | ICD-10-CM | POA: Diagnosis not present

## 2023-08-31 DIAGNOSIS — R269 Unspecified abnormalities of gait and mobility: Secondary | ICD-10-CM

## 2023-08-31 DIAGNOSIS — M6281 Muscle weakness (generalized): Secondary | ICD-10-CM

## 2023-08-31 NOTE — Therapy (Signed)
OUTPATIENT PHYSICAL THERAPY LOWER EXTREMITY TREATMENT  Patient Name: Duane Grant MRN: [id number] DOB:[birth year], 68 y.o., male Today's Date: 08/31/2023  END OF SESSION:  PT End of Session - 08/31/23 0729     Visit Number 4    Number of Visits 16    Date for PT Re-Evaluation 10/16/23    PT Start Time 0716    PT Stop Time 0811    PT Time Calculation (min) 55 min            Past Medical History:  Diagnosis Date   Arthritis    Cancer (HCC) 2009   Prostate, radiation but no surgery   Diabetes mellitus without complication (HCC)    Elevated PSA    H/O removal of cyst 12/02/2019   back    History of prostate cancer    Hyperlipidemia    Hypertension    Past Surgical History:  Procedure Laterality Date   COLONOSCOPY WITH PROPOFOL N/A 01/16/2023   Procedure: COLONOSCOPY WITH PROPOFOL;  Surgeon: Toney Reil, MD;  Location: [facility];  Service: Gastroenterology;  Laterality: N/A;   HERNIA REPAIR  2010   Umbilical   HERNIA REPAIR  2010   JOINT REPLACEMENT     Removal Fatty Tumor Right 2015   [facility], right side of chin/jaw   TOOTH EXTRACTION  12/15/2020   TOTAL HIP ARTHROPLASTY Left 06/09/2016   Procedure: TOTAL HIP ARTHROPLASTY;  Surgeon: Juanell Fairly, MD;  Location: [facility] ORS;  Service: Orthopedics;  Laterality: Left;   Patient Active Problem List   Diagnosis Date Noted   Positive colorectal cancer screening using Cologuard test 01/16/2023   Cecal polyp 01/16/2023   Adenomatous polyp of colon 01/16/2023   Encounter for Department of Transportation (DOT) examination for trucking license 93/71/6967   Hearing loss, left 06/10/2020   Drug-induced myopathy 05/11/2020   Chronic bilateral low back pain without sciatica 12/23/2019   Hyperlipidemia associated with type 2 diabetes mellitus (HCC) 11/11/2019   Recurrent HSV (herpes simplex virus) 03/11/2019   Arthritis of carpometacarpal (CMC) joint of left thumb 03/11/2019   Obesity (BMI 30.0-34.9)  03/11/2019   Status post total hip replacement, left 06/09/2016   Hip pain 05/18/2016   Type 2 diabetes mellitus with other specified complication (HCC) 02/06/2014   Prostate cancer (HCC) 02/06/2014   Parotid mass 12/23/2013   Essential hypertension 12/06/2013   Herpesviral infection of urogenital system 12/06/2013   Erectile dysfunction 12/06/2013   Neck mass 12/06/2013    PCP: Smitty Cords, DO  REFERRING PROVIDER: Altamese Cabal, PA-C  REFERRING DIAG: Presence of unspecified artificial hip joint   THERAPY DIAG:  History of total hip replacement, right  Hip joint stiffness, right  Muscle weakness (generalized)  Gait difficulty  Pain in right hip  Rationale for Evaluation and Treatment: Rehabilitation  ONSET DATE: 08/10/23  SUBJECTIVE:   SUBJECTIVE STATEMENT: Pt. S/p R posterior hip replacement on 08/10/23.  Pt. Returns to MD on 08/22/23 for bandage removal/ reassessment.  Pt. Reports 7-8/10 R hip pain at rest and is hoping to get more pain meds at MD f/u tomorrow.  Pt. Reports 3/10 R hip pain at best with ice/ medications.  Pt. Reports no falls and is currently using RW to ambulate.  Pt. Currently sleeping in recliner chair.    PERTINENT HISTORY: See MD note.  No L hip issues.    PAIN:  Are you having pain? Yes: NPRS scale: 7-8/10 Pain location: R hip Pain description: aching Aggravating factors: walking/ movement Relieving  factors: ice/ medications  PRECAUTIONS: Anterior hip  RED FLAGS: None   WEIGHT BEARING RESTRICTIONS: No  FALLS:  Has patient fallen in last 6 months? 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    PLOF: Independent  PATIENT GOALS: Increase R hip ROM/ strength/ ambulate with no assistive device.   Decrease pain/ promote return to work.    NEXT MD VISIT: 08/22/23  OBJECTIVE:  Note: Objective measures were completed at Evaluation unless otherwise noted.  DIAGNOSTIC FINDINGS: See MD notes  PATIENT SURVEYS:  FOTO initial 36/ goal 67  COGNITION: Overall cognitive status: Within functional limits for tasks assessed   SENSATION: WFL  EDEMA:  Moderate R lower leg swelling.  (+) pitting edema in R lower leg/ ankle.  Educated pt. On compression stockings/ ankle pumps/ elevating.    MUSCLE LENGTH: Hamstrings: Right 62 deg (pain); Left 60 deg  POSTURE: rounded shoulders and forward head  PALPATION: R lower leg/ ankle pitting edema.  Unable to palpate R hip secondary to bandage  LOWER EXTREMITY ROM:  Active ROM Right eval Left eval  Hip flexion 65 deg. (AAROM) 101 deg  Hip extension    Hip abduction 30 deg. 55 deg.  Hip adduction    Hip internal rotation    Hip external rotation    Knee flexion 82 deg. (Pain) 125 deg.  Knee extension 6 deg. 0 deg.  Ankle dorsiflexion    Ankle plantarflexion    Ankle inversion    Ankle eversion     (Blank rows = not tested)  LOWER EXTREMITY MMT:  MMT Right eval Left eval  Hip flexion 2/5 4/5  Hip extension    Hip abduction 3/5 4/5  Hip adduction    Hip internal rotation    Hip external rotation    Knee flexion 4-/5 5/5  Knee extension 4/5 pain 5/5  Ankle dorsiflexion 4/5 4/5  Ankle plantarflexion    Ankle inversion    Ankle eversion     (Blank rows = not tested)  LOWER EXTREMITY SPECIAL TESTS:  NT  FUNCTIONAL TESTS:  5 times sit to stand: TBD 6 minute walk test: TBD  GAIT: Distance walked: in clinic Assistive device utilized: Walker - 2 wheeled Level of assistance: Modified independence Comments: Moderate R antalgic gait pattern with limited R hip flexion/ step length.  Pt. Reports increase R hip pain with increase activity.                                                                                                                               TREATMENT DATE: 08/31/2023  Subjective:  Pt. Reports an increase in R hip discomfort yesterday/this morning (6/10 pain).  Pt. Returns to MD in 4 weeks.  There.ex.:  Nu-Step 10 minutes at L4/seat 10-9.  B LE only.  No increase c/o pain.    STS: 26.33 sec.  No UE assist/ slight increase in R hip discomfort during 4-5 reps.    //-Bar walking, focusing of increase heel-strike to toe-off/ consistent recip. Step pattern with light to no UE assist.  10 laps with mirror feedback. Forward/backwards/lateral with upright posture.    2nd step R/L knee flexion/ lunges with short holds. 10x.    Recip. Stairs with light UE assist on handrails 4 steps x 4.   Walking in clinic/hallway with and without use of SPC and recip. Gait pattern.  Increase cadence/ confidence with gait pattern/ R LE stance phase of gait.     Supine marching/ bridging/ active assist SLR 10x2.  Reviewed HEP (added squats/ step ups)  Manual tx.:  Reassessment of R hip incision/ STM around incision.  Pt. Has steristrips in place and minimal scabbing at incision.    Supine hamstring/ hip abduction/ adduction/ gentle IR/ER stretches with holds.  5x each.  Pt. Will ice R hip at home.     Not today: Resisted gait 2BTB 8x all 4-planes (progressing to no UE assist) in //-bars.     PATIENT EDUCATION:  Education details: Discussed HEP ([id number]), ice pack.  Person educated: Patient and Spouse Education method: Explanation, Demonstration, and Handouts Education comprehension: verbalized understanding and returned demonstration  HOME EXERCISE PROGRAM: Access Code: [email or web address] URL: [email or web address] Date: 08/21/2023 Prepared by: Dorene Grebe  Exercises - Standing March with Counter Support  - 2 x daily - 7 x weekly - 2 sets - 10 reps - Standing Hip Abduction with Unilateral Counter Support  - 2 x daily - 7 x weekly - 2 sets - 10 reps - Heel Raises with Counter Support  - 2 x  daily - 7 x weekly - 2 sets - 10 reps - Supine Heel Slide  - 2 x daily - 7 x weekly - 2 sets - 10 reps - Supine Gluteal Sets  - 2 x daily - 7 x weekly - 2 sets - 10 reps - Supine Single Leg Ankle Pumps  - 2 x daily - 7 x weekly - 2 sets - 10 reps - Seated Knee Extension AROM  - 2 x daily - 7 x weekly - 2 sets - 10 reps.  ASSESSMENT:  CLINICAL IMPRESSION: Pt. Reports a slight increase in R hip pain today and enters PT with use of SPC.  Pt. Understands current HEP and demonstrates increase R hip/ knee control.  Pt. Ambulates with improved gait pattern with use of SPC for safety/ balance and no increase c/o pain.  Good incision healing and understanding of steristrip management.  Pt. Was highly compliant with all exercises and interventions performed during today's session.  Pt. Will benefit from skilled PT services to increase R hip ROM/ strength to improve pain-free functional mobility/ return to work.     OBJECTIVE IMPAIRMENTS: Abnormal gait, decreased activity tolerance, decreased balance, decreased endurance, decreased mobility, difficulty walking, decreased ROM, decreased strength, increased edema, impaired flexibility, improper body mechanics, postural dysfunction, and pain.   ACTIVITY LIMITATIONS: carrying, lifting, bending, sitting, standing, squatting, stairs, transfers, and locomotion level  PARTICIPATION LIMITATIONS: cleaning, driving, community activity, occupation, and yard work  PERSONAL FACTORS: Fitness and Past/current experiences are also affecting patient's functional outcome.   REHAB POTENTIAL: Good  CLINICAL DECISION MAKING: Stable/uncomplicated  EVALUATION COMPLEXITY: Low   GOALS: Goals reviewed with patient? Yes  SHORT TERM GOALS: Target  date: 09/18/23 Pt. Independent with HEP to increase R hip AROM to WNL as compared to L hip to improve transfers/ gait with no assistive device.  Baseline:  see above Goal status: INITIAL  LONG TERM GOALS: Target date: 10/16/23 Pt.  Will increase FOTO to 64 to improve pain-free functional mobility.   Baseline: initial 36 Goal status: INITIAL  2.  Pt. Will increase R hip strength to 4/5 MMT to improve ability to step into truck.   Baseline: see above Goal status: INITIAL  3.  Pt. Will ambulate community distances with normalized gait pattern and no increase c/o pain independently to promote return to work.   Baseline: Mod. I with RW/ moderate R antalgic gait.   Goal status: INITIAL  4.  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    Baseline: currently not able to work  Goal status: INITIAL  PLAN:  PT FREQUENCY: 2x/week  PT DURATION: 8 weeks  PLANNED INTERVENTIONS: 97110-Therapeutic exercises, 97530- Therapeutic activity, [id number]- Neuromuscular re-education, 97535- Self Care, [id number]- Manual therapy, [phone]- Gait training, Patient/Family education, Balance training, Stair training, Cryotherapy, and Moist heat  PLAN FOR NEXT SESSION:  Progress HEP   Cammie Mcgee, PT, DPT # [phone] 08/31/2023, 8:40 AM

## 2023-09-04 ENCOUNTER — Other Ambulatory Visit: Payer: Self-pay

## 2023-09-04 ENCOUNTER — Encounter: Payer: Self-pay | Admitting: Physical Therapy

## 2023-09-04 ENCOUNTER — Ambulatory Visit: Payer: BC Managed Care – PPO | Admitting: Physical Therapy

## 2023-09-04 DIAGNOSIS — M6281 Muscle weakness (generalized): Secondary | ICD-10-CM | POA: Diagnosis not present

## 2023-09-04 DIAGNOSIS — M25551 Pain in right hip: Secondary | ICD-10-CM | POA: Diagnosis not present

## 2023-09-04 DIAGNOSIS — R269 Unspecified abnormalities of gait and mobility: Secondary | ICD-10-CM | POA: Diagnosis not present

## 2023-09-04 DIAGNOSIS — M25651 Stiffness of right hip, not elsewhere classified: Secondary | ICD-10-CM

## 2023-09-04 DIAGNOSIS — Z96641 Presence of right artificial hip joint: Secondary | ICD-10-CM | POA: Diagnosis not present

## 2023-09-04 NOTE — Therapy (Signed)
OUTPATIENT PHYSICAL THERAPY LOWER EXTREMITY TREATMENT  Patient Name: Duane Grant MRN: [id number] DOB:03/10/1956, 68 y.o., male Today's Date: 09/04/2023  END OF SESSION:  PT End of Session - 09/04/23 0721     Visit Number 5    Number of Visits 16    Date for PT Re-Evaluation 10/16/23    PT Start Time 0721    PT Stop Time 0819    PT Time Calculation (min) 58 min            Past Medical History:  Diagnosis Date   Arthritis    Cancer (HCC) 2009   Prostate, radiation but no surgery   Diabetes mellitus without complication (HCC)    Elevated PSA    H/O removal of cyst 12/02/2019   back    History of prostate cancer    Hyperlipidemia    Hypertension    Past Surgical History:  Procedure Laterality Date   COLONOSCOPY WITH PROPOFOL N/A 01/16/2023   Procedure: COLONOSCOPY WITH PROPOFOL;  Surgeon: Toney Reil, MD;  Location: [facility];  Service: Gastroenterology;  Laterality: N/A;   HERNIA REPAIR  2010   Umbilical   HERNIA REPAIR  2010   JOINT REPLACEMENT     Removal Fatty Tumor Right 2015   [facility], right side of chin/jaw   TOOTH EXTRACTION  12/15/2020   TOTAL HIP ARTHROPLASTY Left 06/09/2016   Procedure: TOTAL HIP ARTHROPLASTY;  Surgeon: Juanell Fairly, MD;  Location: [facility] ORS;  Service: Orthopedics;  Laterality: Left;   Patient Active Problem List   Diagnosis Date Noted   Positive colorectal cancer screening using Cologuard test 01/16/2023   Cecal polyp 01/16/2023   Adenomatous polyp of colon 01/16/2023   Encounter for Department of Transportation (DOT) examination for trucking license 08/65/7846   Hearing loss, left 06/10/2020   Drug-induced myopathy 05/11/2020   Chronic bilateral low back pain without sciatica 12/23/2019   Hyperlipidemia associated with type 2 diabetes mellitus (HCC) 11/11/2019   Recurrent HSV (herpes simplex virus) 03/11/2019   Arthritis of carpometacarpal (CMC) joint of left thumb 03/11/2019   Obesity (BMI 30.0-34.9)  03/11/2019   Status post total hip replacement, left 06/09/2016   Hip pain 05/18/2016   Type 2 diabetes mellitus with other specified complication (HCC) 02/06/2014   Prostate cancer (HCC) 02/06/2014   Parotid mass 12/23/2013   Essential hypertension 12/06/2013   Herpesviral infection of urogenital system 12/06/2013   Erectile dysfunction 12/06/2013   Neck mass 12/06/2013    PCP: Smitty Cords, DO  REFERRING PROVIDER: Altamese Cabal, PA-C  REFERRING DIAG: Presence of unspecified artificial hip joint   THERAPY DIAG:  History of total hip replacement, right  Hip joint stiffness, right  Muscle weakness (generalized)  Gait difficulty  Pain in right hip  Rationale for Evaluation and Treatment: Rehabilitation  ONSET DATE: 08/10/23  SUBJECTIVE:   SUBJECTIVE STATEMENT: Pt. S/p R posterior hip replacement on 08/10/23.  Pt. Returns to MD on 08/22/23 for bandage removal/ reassessment.  Pt. Reports 7-8/10 R hip pain at rest and is hoping to get more pain meds at MD f/u tomorrow.  Pt. Reports 3/10 R hip pain at best with ice/ medications.  Pt. Reports no falls and is currently using RW to ambulate.  Pt. Currently sleeping in recliner chair.    PERTINENT HISTORY: See MD note.  No L hip issues.    PAIN:  Are you having pain? Yes: NPRS scale: 7-8/10 Pain location: R hip Pain description: aching Aggravating factors: walking/ movement Relieving  factors: ice/ medications  PRECAUTIONS: Anterior hip  RED FLAGS: None   WEIGHT BEARING RESTRICTIONS: No  FALLS:  Has patient fallen in last 6 months? 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    PLOF: Independent  PATIENT GOALS: Increase R hip ROM/ strength/ ambulate with no assistive device.   Decrease pain/ promote return to work.    NEXT MD VISIT: 08/22/23  OBJECTIVE:  Note: Objective measures were completed at Evaluation unless otherwise noted.  DIAGNOSTIC FINDINGS: See MD notes  PATIENT SURVEYS:  FOTO initial 36/ goal 28  COGNITION: Overall cognitive status: Within functional limits for tasks assessed   SENSATION: WFL  EDEMA:  Moderate R lower leg swelling.  (+) pitting edema in R lower leg/ ankle.  Educated pt. On compression stockings/ ankle pumps/ elevating.    MUSCLE LENGTH: Hamstrings: Right 62 deg (pain); Left 60 deg  POSTURE: rounded shoulders and forward head  PALPATION: R lower leg/ ankle pitting edema.  Unable to palpate R hip secondary to bandage  LOWER EXTREMITY ROM:  Active ROM Right eval Left eval  Hip flexion 65 deg. (AAROM) 101 deg  Hip extension    Hip abduction 30 deg. 55 deg.  Hip adduction    Hip internal rotation    Hip external rotation    Knee flexion 82 deg. (Pain) 125 deg.  Knee extension 6 deg. 0 deg.  Ankle dorsiflexion    Ankle plantarflexion    Ankle inversion    Ankle eversion     (Blank rows = not tested)  LOWER EXTREMITY MMT:  MMT Right eval Left eval  Hip flexion 2/5 4/5  Hip extension    Hip abduction 3/5 4/5  Hip adduction    Hip internal rotation    Hip external rotation    Knee flexion 4-/5 5/5  Knee extension 4/5 pain 5/5  Ankle dorsiflexion 4/5 4/5  Ankle plantarflexion    Ankle inversion    Ankle eversion     (Blank rows = not tested)  LOWER EXTREMITY SPECIAL TESTS:  NT  FUNCTIONAL TESTS:  5 times sit to stand: TBD 6 minute walk test: TBD  GAIT: Distance walked: in clinic Assistive device utilized: Walker - 2 wheeled Level of assistance: Modified independence Comments: Moderate R antalgic gait pattern with limited R hip flexion/ step length.  Pt. Reports increase R hip pain with increase activity.   STS: 26.33 sec.  No UE assist/ slight increase in R hip discomfort during 4-5 reps.                                                                                                                                TREATMENT DATE: 09/04/2023  Subjective:  Pt. Reports an  increase in R hip discomfort yesterday/this morning (7/10 pain).  Pt. Returns to MD in 4 weeks. Pt. Discusses pain management options with SPT regarding appropriate heat and ice application. Pt. Is compliant with all exercises and notes HEP exercises have been going well.   There.ex.:  Nu-Step 10 minutes at L4/seat 10-9.  B LE only.  No increase c/o pain.    //-Bar walking, focusing of increase heel-strike to toe-off/ consistent recip. Step pattern with light to no UE assist. 12 laps with mirror feedback. Forward/backwards/lateral with upright posture.    //-Bar B step up on 6" box 2 x 10. RPE 7 and NPS 6/10.   Recip. Stairs with light UE assist on handrails 4 steps x 5. RPE  Total gym squats L13 3x10. RPE    Walking in clinic/hallway without use of SPC and recip. Gait pattern.  Increase cadence/ confidence with gait pattern/ R LE stance phase of gait. 3 laps, 208 ft.    Supine marching/ bridging/ active assist SLR 10x2.  Reviewed HEP (added squats/ step ups)  Manual tx.:  Reassessment of R hip incision/ STM around incision.  Pt. Has steristrips in place and minimal scabbing at incision.    Supine hamstring/ hip abduction/ adduction/ gentle IR/ER stretches with holds.  5x each.  Pt. Will ice R hip at home.     Not today: Resisted gait 2BTB 8x all 4-planes (progressing to no UE assist) in //-bars.     PATIENT EDUCATION:  Education details: Discussed HEP ([id number]), ice pack.  Person educated: Patient and Spouse Education method: Explanation, Demonstration, and Handouts Education comprehension: verbalized understanding and returned demonstration  HOME EXERCISE PROGRAM: Access Code: [email or web address] URL: [email or web address] Date: 08/21/2023 Prepared by: Dorene Grebe  Exercises - Standing  March with Counter Support  - 2 x daily - 7 x weekly - 2 sets - 10 reps - Standing Hip Abduction with Unilateral Counter Support  - 2 x daily - 7 x weekly - 2 sets - 10 reps - Heel Raises with Counter Support  - 2 x daily - 7 x weekly - 2 sets - 10 reps - Supine Heel Slide  - 2 x daily - 7 x weekly - 2 sets - 10 reps - Supine Gluteal Sets  - 2 x daily - 7 x weekly - 2 sets - 10 reps - Supine Single Leg Ankle Pumps  - 2 x daily - 7 x weekly - 2 sets - 10 reps - Seated Knee Extension AROM  - 2 x daily - 7 x weekly - 2 sets - 10 reps.  ASSESSMENT:  CLINICAL IMPRESSION: Pt. Reports a slight increase in R hip pain today and enters PT with use of SPC.  Pt. Understands current HEP and demonstrates increase R hip/ knee control.  Pt. Ambulates with improved gait pattern with use of SPC for safety/ balance and no increase c/o pain.  Good incision healing and understanding of steristrip management.  Pt. Was highly compliant with all exercises and interventions performed during today's session.  Pt. Advanced several exercises including total walking distance without LRAD and added total gym partial ROM squats to there. Ex. Pt. Will benefit from skilled PT services to increase R hip ROM/ strength to improve pain-free functional mobility/ return to work.     OBJECTIVE IMPAIRMENTS: Abnormal gait, decreased activity tolerance, decreased balance, decreased endurance, decreased mobility, difficulty walking, decreased ROM, decreased strength, increased edema, impaired flexibility, improper body mechanics, postural dysfunction, and pain.   ACTIVITY LIMITATIONS: carrying, lifting,  bending, sitting, standing, squatting, stairs, transfers, and locomotion level  PARTICIPATION LIMITATIONS: cleaning, driving, community activity, occupation, and yard work  PERSONAL FACTORS: Fitness and Past/current experiences are also affecting patient's functional outcome.   REHAB POTENTIAL: Good  CLINICAL DECISION MAKING:  Stable/uncomplicated  EVALUATION COMPLEXITY: Low   GOALS: Goals reviewed with patient? Yes  SHORT TERM GOALS: Target date: 09/18/23 Pt. Independent with HEP to increase R hip AROM to WNL as compared to L hip to improve transfers/ gait with no assistive device.  Baseline:  see above Goal status: INITIAL  LONG TERM GOALS: Target date: 10/16/23 Pt. Will increase FOTO to 64 to improve pain-free functional mobility.   Baseline: initial 36 Goal status: INITIAL  2.  Pt. Will increase R hip strength to 4/5 MMT to improve ability to step into truck.   Baseline: see above Goal status: INITIAL  3.  Pt. Will ambulate community distances with normalized gait pattern and no increase c/o pain independently to promote return to work.   Baseline: Mod. I with RW/ moderate R antalgic gait.   Goal status: INITIAL  4.  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    Baseline: currently not able to work  Goal status: INITIAL  PLAN:  PT FREQUENCY: 2x/week  PT DURATION: 8 weeks  PLANNED INTERVENTIONS: 97110-Therapeutic exercises, 97530- Therapeutic activity, [id number]- Neuromuscular re-education, 97535- Self Care, 10272- Manual therapy, [id number]- Gait training, Patient/Family education, Balance training, Stair training, Cryotherapy, and Moist heat  PLAN FOR NEXT SESSION:  Progress HEP and total gym exercises.   Cammie Mcgee, PT, DPT # [id number] [place] Cage, SPT 09/04/2023, 10:10 AM

## 2023-09-06 ENCOUNTER — Ambulatory Visit: Payer: BC Managed Care – PPO | Admitting: Physical Therapy

## 2023-09-06 ENCOUNTER — Other Ambulatory Visit: Payer: Self-pay | Admitting: Family Medicine

## 2023-09-06 ENCOUNTER — Encounter: Payer: Self-pay | Admitting: Physical Therapy

## 2023-09-06 DIAGNOSIS — Z96641 Presence of right artificial hip joint: Secondary | ICD-10-CM

## 2023-09-06 DIAGNOSIS — M6281 Muscle weakness (generalized): Secondary | ICD-10-CM | POA: Diagnosis not present

## 2023-09-06 DIAGNOSIS — E1169 Type 2 diabetes mellitus with other specified complication: Secondary | ICD-10-CM

## 2023-09-06 DIAGNOSIS — M25551 Pain in right hip: Secondary | ICD-10-CM | POA: Diagnosis not present

## 2023-09-06 DIAGNOSIS — R269 Unspecified abnormalities of gait and mobility: Secondary | ICD-10-CM

## 2023-09-06 DIAGNOSIS — M25651 Stiffness of right hip, not elsewhere classified: Secondary | ICD-10-CM

## 2023-09-06 NOTE — Therapy (Signed)
OUTPATIENT PHYSICAL THERAPY LOWER EXTREMITY TREATMENT  Patient Name: Duane Grant MRN: [id number] DOB:January 15, 1956, 68 y.o., male Today's Date: 09/06/2023  END OF SESSION:  PT End of Session - 09/06/23 0725     Visit Number 6    Number of Visits 16    Date for PT Re-Evaluation 10/16/23    PT Start Time 0725    PT Stop Time 0826    PT Time Calculation (min) 61 min            Past Medical History:  Diagnosis Date   Arthritis    Cancer (HCC) 2009   Prostate, radiation but no surgery   Diabetes mellitus without complication (HCC)    Elevated PSA    H/O removal of cyst 12/02/2019   back    History of prostate cancer    Hyperlipidemia    Hypertension    Past Surgical History:  Procedure Laterality Date   COLONOSCOPY WITH PROPOFOL N/A 01/16/2023   Procedure: COLONOSCOPY WITH PROPOFOL;  Surgeon: Toney Reil, MD;  Location: [facility];  Service: Gastroenterology;  Laterality: N/A;   HERNIA REPAIR  2010   Umbilical   HERNIA REPAIR  2010   JOINT REPLACEMENT     Removal Fatty Tumor Right 2015   [facility], right side of chin/jaw   TOOTH EXTRACTION  12/15/2020   TOTAL HIP ARTHROPLASTY Left 06/09/2016   Procedure: TOTAL HIP ARTHROPLASTY;  Surgeon: Juanell Fairly, MD;  Location: [facility] OR[facility];  Service: Orthopedics;  Laterality: Left;   Patient Active Problem List   Diagnosis Date Noted   Positive colorectal cancer screening using Cologuard test 01/16/2023   Cecal polyp 01/16/2023   Adenomatous polyp of colon 01/16/2023   Encounter for Department of Transportation (DOT) examination for trucking license 40/98/1191   Hearing loss, left 06/10/2020   Drug-induced myopathy 05/11/2020   Chronic bilateral low back pain without sciatica 12/23/2019   Hyperlipidemia associated with type 2 diabetes mellitus (HCC) 11/11/2019   Recurrent HSV (herpes simplex virus) 03/11/2019   Arthritis of carpometacarpal (CMC) joint of left thumb 03/11/2019   Obesity (BMI 30.0-34.9)  03/11/2019   Status post total hip replacement, left 06/09/2016   Hip pain 05/18/2016   Type 2 diabetes mellitus with other specified complication (HCC) 02/06/2014   Prostate cancer (HCC) 02/06/2014   Parotid mass 12/23/2013   Essential hypertension 12/06/2013   Herpesviral infection of urogenital system 12/06/2013   Erectile dysfunction 12/06/2013   Neck mass 12/06/2013    PCP: Smitty Cords, DO  REFERRING PROVIDER: Altamese Cabal, PA-C  REFERRING DIAG: Presence of unspecified artificial hip joint   THERAPY DIAG:  History of total hip replacement, right  Hip joint stiffness, right  Muscle weakness (generalized)  Gait difficulty  Pain in right hip  Rationale for Evaluation and Treatment: Rehabilitation  ONSET DATE: 08/10/23  SUBJECTIVE:   SUBJECTIVE STATEMENT: Pt. S/p R posterior hip replacement on 08/10/23.  Pt. Returns to MD on 08/22/23 for bandage removal/ reassessment.  Pt. Reports 7-8/10 R hip pain at rest and is hoping to get more pain meds at MD f/u tomorrow.  Pt. Reports 3/10 R hip pain at best with ice/ medications.  Pt. Reports no falls and is currently using RW to ambulate.  Pt. Currently sleeping in recliner chair.    PERTINENT HISTORY: See MD note.  No L hip issues.    PAIN:  Are you having pain? Yes: NPRS scale: 7-8/10 Pain location: R hip Pain description: aching Aggravating factors: walking/ movement Relieving  factors: ice/ medications  PRECAUTIONS: Anterior hip  RED FLAGS: None   WEIGHT BEARING RESTRICTIONS: No  FALLS:  Has patient fallen in last 6 months? 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    PLOF: Independent  PATIENT GOALS: Increase R hip ROM/ strength/ ambulate with no assistive device.   Decrease pain/ promote return to work.    NEXT MD VISIT: 08/22/23  OBJECTIVE:  Note: Objective measures were completed at Evaluation unless otherwise noted.  DIAGNOSTIC FINDINGS: See MD notes  PATIENT SURVEYS:  FOTO initial 36/ goal 90  COGNITION: Overall cognitive status: Within functional limits for tasks assessed   SENSATION: WFL  EDEMA:  Moderate R lower leg swelling.  (+) pitting edema in R lower leg/ ankle.  Educated pt. On compression stockings/ ankle pumps/ elevating.    MUSCLE LENGTH: Hamstrings: Right 62 deg (pain); Left 60 deg  POSTURE: rounded shoulders and forward head  PALPATION: R lower leg/ ankle pitting edema.  Unable to palpate R hip secondary to bandage  LOWER EXTREMITY ROM:  Active ROM Right eval Left eval  Hip flexion 65 deg. (AAROM) 101 deg  Hip extension    Hip abduction 30 deg. 55 deg.  Hip adduction    Hip internal rotation    Hip external rotation    Knee flexion 82 deg. (Pain) 125 deg.  Knee extension 6 deg. 0 deg.  Ankle dorsiflexion    Ankle plantarflexion    Ankle inversion    Ankle eversion     (Blank rows = not tested)  LOWER EXTREMITY MMT:  MMT Right eval Left eval  Hip flexion 2/5 4/5  Hip extension    Hip abduction 3/5 4/5  Hip adduction    Hip internal rotation    Hip external rotation    Knee flexion 4-/5 5/5  Knee extension 4/5 pain 5/5  Ankle dorsiflexion 4/5 4/5  Ankle plantarflexion    Ankle inversion    Ankle eversion     (Blank rows = not tested)  LOWER EXTREMITY SPECIAL TESTS:  NT  FUNCTIONAL TESTS:  5 times sit to stand: TBD 6 minute walk test: TBD  GAIT: Distance walked: in clinic Assistive device utilized: Walker - 2 wheeled Level of assistance: Modified independence Comments: Moderate R antalgic gait pattern with limited R hip flexion/ step length.  Pt. Reports increase R hip pain with increase activity.   STS: 26.33 sec.  No UE assist/ slight increase in R hip discomfort during 4-5 reps.                                                                                                                                TREATMENT DATE: 09/06/2023  Subjective:  Pt. Reports an  increase in R hip discomfort yesterday/this morning 6/10 pain NPS.  Pt. Returns to MD in 4 weeks. Pt. Discusses that soreness and stiffness are easing. Pt. Notes that they are starting to be able to return to more ADLs/IADLs. Pt. Is compliant with all exercises and notes HEP exercises have been going well.   There.ex.:  Nu-Step 10 minutes at L4/seat 10-9.  B LE only.  No increase c/o pain.    Total gym squats L22 3x10. RPE 4/10 and NPS 4/10 (tender)  Hallway walking 74ft x 2 focusing of increase heel-strike to toe-off/ consistent recip. Step pattern with light to no UE assist.   Hallway marching 63ft x 2 focusing on increasing active hip flexion  2 12" Lateral hurdles to up and down 4 stairs. Min. UE assist on hand rails. x10 RPE 6/10 and NPS 6/10.   12" Step-up 3x10 at staircase. Mod. UE assist on handrails. RPE 8/10 and NPS 7/10  Manual tx.:  Reassessment of R hip incision/ STM around incision.  PT Removed all remaining steri strips covering incision site. Incision site is clean and sterile.  Supine hamstring/ hip abduction/ adduction/ gentle IR/ER stretches with holds.  5x each.  Hypervolt STM to B calves and hamstrings   Pt. Will ice R hip at home.       Not today: Resisted gait 2BTB 8x all 4-planes (progressing to no UE assist) in //-bars.     PATIENT EDUCATION:  Education details: Discussed HEP ([id number]), ice pack.  Person educated: Patient and Spouse Education method: Explanation, Demonstration, and Handouts Education comprehension: verbalized understanding and returned demonstration  HOME EXERCISE PROGRAM: Access Code: [email or web address] URL: [email or web address] Date: 08/21/2023 Prepared by: Dorene Grebe  Exercises - Standing March with Counter Support  - 2 x daily - 7 x weekly -  2 sets - 10 reps - Standing Hip Abduction with Unilateral Counter Support  - 2 x daily - 7 x weekly - 2 sets - 10 reps - Heel Raises with Counter Support  - 2 x daily - 7 x weekly - 2 sets - 10 reps - Supine Heel Slide  - 2 x daily - 7 x weekly - 2 sets - 10 reps - Supine Gluteal Sets  - 2 x daily - 7 x weekly - 2 sets - 10 reps - Supine Single Leg Ankle Pumps  - 2 x daily - 7 x weekly - 2 sets - 10 reps - Seated Knee Extension AROM  - 2 x daily - 7 x weekly - 2 sets - 10 reps.  ASSESSMENT:  CLINICAL IMPRESSION: Pt. Reports minimal R hip pain today and enters PT with use of SPC.  Pt. Understands current HEP and demonstrates increase R hip/ knee control.  Pt. Ambulates with improved gait pattern with use of SPC for safety/ balance and no increase c/o pain.  Good incision healing and understanding of steristrip management. Pt. Was highly compliant with all exercises and interventions performed during today's session.  Pt. Incision site is healing well and all steristrips have been removed. Pt. Advanced several exercises including total walking distance without AD, increased total gym partial ROM squats L13 to L22, and completed 12" step-ups.Pt. Will benefit from skilled PT services to increase R hip ROM/ strength to improve pain-free functional mobility/ return to work.     OBJECTIVE IMPAIRMENTS: Abnormal gait, decreased activity tolerance, decreased balance, decreased endurance, decreased mobility, difficulty walking, decreased ROM, decreased strength, increased edema, impaired flexibility, improper body mechanics, postural dysfunction, and pain.  ACTIVITY LIMITATIONS: carrying, lifting, bending, sitting, standing, squatting, stairs, transfers, and locomotion level  PARTICIPATION LIMITATIONS: cleaning, driving, community activity, occupation, and yard work  PERSONAL FACTORS: Fitness and Past/current experiences are also affecting patient's functional outcome.   REHAB POTENTIAL: Good  CLINICAL  DECISION MAKING: Stable/uncomplicated  EVALUATION COMPLEXITY: Low   GOALS: Goals reviewed with patient? Yes  SHORT TERM GOALS: Target date: 09/18/23 Pt. Independent with HEP to increase R hip AROM to WNL as compared to L hip to improve transfers/ gait with no assistive device.  Baseline:  see above Goal status: INITIAL  LONG TERM GOALS: Target date: 10/16/23 Pt. Will increase FOTO to 64 to improve pain-free functional mobility.   Baseline: initial 36 Goal status: INITIAL  2.  Pt. Will increase R hip strength to 4/5 MMT to improve ability to step into truck.   Baseline: see above Goal status: INITIAL  3.  Pt. Will ambulate community distances with normalized gait pattern and no increase c/o pain independently to promote return to work.   Baseline: Mod. I with RW/ moderate R antalgic gait.   Goal status: INITIAL  4.  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    Baseline: currently not able to work  Goal status: INITIAL  PLAN:  PT FREQUENCY: 2x/week  PT DURATION: 8 weeks  PLANNED INTERVENTIONS: 97110-Therapeutic exercises, 97530- Therapeutic activity, [id number]- Neuromuscular re-education, 97535- Self Care, [id number]- Manual therapy, [phone]- Gait training, Patient/Family education, Balance training, Stair training, Cryotherapy, and Moist heat  PLAN FOR NEXT SESSION:  Progress gait trainint/ADLs and total gym exercises.   Cammie Mcgee, PT, DPT # [id number] [place] Cage, SPT 09/06/2023, 8:48 AM

## 2023-09-06 NOTE — Telephone Encounter (Signed)
Requested Prescriptions  Pending Prescriptions Disp Refills   pravastatin (PRAVACHOL) 10 MG tablet [Pharmacy Med Name: Pravastatin Sodium 10 MG Oral Tablet] 90 tablet 0    Sig: Take 1 tablet by mouth once daily     Cardiovascular:  Antilipid - Statins Failed - 09/06/2023  1:22 PM      Failed - Lipid Panel in normal range within the last 12 months    Cholesterol  Date Value Ref Range Status  11/21/2022 172 <200 mg/dL Final   LDL Cholesterol (Calc)  Date Value Ref Range Status  11/21/2022 89 mg/dL (calc) Final    Comment:    Reference range: <100 . Desirable range <100 mg/dL for primary prevention;   <70 mg/dL for patients with CHD or diabetic patients  with > or = 2 CHD risk factors. Marland Kitchen LDL-C is now calculated using the Martin-Hopkins  calculation, which is a validated novel method providing  better accuracy than the Friedewald equation in the  estimation of LDL-C.  Horald Pollen et al. Lenox Ahr. 1191;478(29): 2061-2068  (http://education.[facility].com/faq/FAQ164)    HDL  Date Value Ref Range Status  11/21/2022 57 > OR = 40 mg/dL Final   Triglycerides  Date Value Ref Range Status  11/21/2022 166 (H) <150 mg/dL Final         Passed - Patient is not pregnant      Passed - Valid encounter within last 12 months    Recent Outpatient Visits           3 months ago Type 2 diabetes mellitus with other specified complication, without long-term current use of insulin [facility])   [place] [facility] [place], Netta Neat, DO   9 months ago Annual physical exam   [place] [facility] [place], Netta Neat, DO   1 year ago Type 2 diabetes mellitus with other specified complication, without long-term current use of insulin [facility])   [facility] Althea Charon, Netta Neat, DO   1 year ago Annual physical exam   [facility] Smitty Cords, DO   2 years ago Type 2 diabetes mellitus  with other specified complication, without long-term current use of insulin [facility])   [place] [facility] [place], Netta Neat, DO       Future Appointments             In 3 months Althea Charon, Netta Neat, DO [place] [facility]

## 2023-09-11 ENCOUNTER — Ambulatory Visit: Payer: BC Managed Care – PPO | Admitting: Physical Therapy

## 2023-09-11 ENCOUNTER — Encounter: Payer: Self-pay | Admitting: Physical Therapy

## 2023-09-11 DIAGNOSIS — M25551 Pain in right hip: Secondary | ICD-10-CM

## 2023-09-11 DIAGNOSIS — R269 Unspecified abnormalities of gait and mobility: Secondary | ICD-10-CM | POA: Diagnosis not present

## 2023-09-11 DIAGNOSIS — Z96641 Presence of right artificial hip joint: Secondary | ICD-10-CM

## 2023-09-11 DIAGNOSIS — M6281 Muscle weakness (generalized): Secondary | ICD-10-CM | POA: Diagnosis not present

## 2023-09-11 DIAGNOSIS — M25651 Stiffness of right hip, not elsewhere classified: Secondary | ICD-10-CM | POA: Diagnosis not present

## 2023-09-11 NOTE — Therapy (Signed)
OUTPATIENT PHYSICAL THERAPY LOWER EXTREMITY TREATMENT  Patient Name: Duane Grant MRN: [id number] DOB:[birth year], 68 y.o., male Today's Date: 09/11/2023  END OF SESSION:  PT End of Session - 09/11/23 0723     Visit Number 7    Number of Visits 16    Date for PT Re-Evaluation 10/16/23    PT Start Time 0716    PT Stop Time 0825    PT Time Calculation (min) 69 min            Past Medical History:  Diagnosis Date   Arthritis    Cancer (HCC) 2009   Prostate, radiation but no surgery   Diabetes mellitus without complication (HCC)    Elevated PSA    H/O removal of cyst 12/02/2019   back    History of prostate cancer    Hyperlipidemia    Hypertension    Past Surgical History:  Procedure Laterality Date   COLONOSCOPY WITH PROPOFOL N/A 01/16/2023   Procedure: COLONOSCOPY WITH PROPOFOL;  Surgeon: Toney Reil, MD;  Location: [facility] ENDOSCOPY;  Service: Gastroenterology;  Laterality: N/A;   HERNIA REPAIR  2010   Umbilical   HERNIA REPAIR  2010   JOINT REPLACEMENT     Removal Fatty Tumor Right 2015   [facility], right side of chin/jaw   TOOTH EXTRACTION  12/15/2020   TOTAL HIP ARTHROPLASTY Left 06/09/2016   Procedure: TOTAL HIP ARTHROPLASTY;  Surgeon: Juanell Fairly, MD;  Location: [facility] ORS;  Service: Orthopedics;  Laterality: Left;   Patient Active Problem List   Diagnosis Date Noted   Positive colorectal cancer screening using Cologuard test 01/16/2023   Cecal polyp 01/16/2023   Adenomatous polyp of colon 01/16/2023   Encounter for Department of Transportation (DOT) examination for trucking license 13/03/6577   Hearing loss, left 06/10/2020   Drug-induced myopathy 05/11/2020   Chronic bilateral low back pain without sciatica 12/23/2019   Hyperlipidemia associated with type 2 diabetes mellitus (HCC) 11/11/2019   Recurrent HSV (herpes simplex virus) 03/11/2019   Arthritis of carpometacarpal (CMC) joint of left thumb 03/11/2019   Obesity (BMI 30.0-34.9)  03/11/2019   Status post total hip replacement, left 06/09/2016   Hip pain 05/18/2016   Type 2 diabetes mellitus with other specified complication (HCC) 02/06/2014   Prostate cancer (HCC) 02/06/2014   Parotid mass 12/23/2013   Essential hypertension 12/06/2013   Herpesviral infection of urogenital system 12/06/2013   Erectile dysfunction 12/06/2013   Neck mass 12/06/2013    PCP: Smitty Cords, DO  REFERRING PROVIDER: Altamese Cabal, PA-C  REFERRING DIAG: Presence of unspecified artificial hip joint   THERAPY DIAG:  History of total hip replacement, right  Hip joint stiffness, right  Muscle weakness (generalized)  Gait difficulty  Pain in right hip  Rationale for Evaluation and Treatment: Rehabilitation  ONSET DATE: 08/10/23  SUBJECTIVE:   SUBJECTIVE STATEMENT: Pt. S/p R posterior hip replacement on 08/10/23.  Pt. Returns to MD on 08/22/23 for bandage removal/ reassessment.  Pt. Reports 7-8/10 R hip pain at rest and is hoping to get more pain meds at MD f/u tomorrow.  Pt. Reports 3/10 R hip pain at best with ice/ medications.  Pt. Reports no falls and is currently using RW to ambulate.  Pt. Currently sleeping in recliner chair.    PERTINENT HISTORY: See MD note.  No L hip issues.    PAIN:  Are you having pain? Yes: NPRS scale: 7-8/10 Pain location: R hip Pain description: aching Aggravating factors: walking/ movement Relieving  factors: ice/ medications  PRECAUTIONS: Anterior hip  RED FLAGS: None   WEIGHT BEARING RESTRICTIONS: No  FALLS:  Has patient fallen in last 6 months? 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    PLOF: Independent  PATIENT GOALS: Increase R hip ROM/ strength/ ambulate with no assistive device.   Decrease pain/ promote return to work.    NEXT MD VISIT: 08/22/23  OBJECTIVE:  Note: Objective measures were completed at Evaluation unless otherwise noted.  DIAGNOSTIC FINDINGS: See MD notes  PATIENT SURVEYS:  FOTO initial 36/ goal 108  COGNITION: Overall cognitive status: Within functional limits for tasks assessed   SENSATION: WFL  EDEMA:  Moderate R lower leg swelling.  (+) pitting edema in R lower leg/ ankle.  Educated pt. On compression stockings/ ankle pumps/ elevating.    MUSCLE LENGTH: Hamstrings: Right 62 deg (pain); Left 60 deg  POSTURE: rounded shoulders and forward head  PALPATION: R lower leg/ ankle pitting edema.  Unable to palpate R hip secondary to bandage  LOWER EXTREMITY ROM:  Active ROM Right eval Left eval  Hip flexion 65 deg. (AAROM) 101 deg  Hip extension    Hip abduction 30 deg. 55 deg.  Hip adduction    Hip internal rotation    Hip external rotation    Knee flexion 82 deg. (Pain) 125 deg.  Knee extension 6 deg. 0 deg.  Ankle dorsiflexion    Ankle plantarflexion    Ankle inversion    Ankle eversion     (Blank rows = not tested)  LOWER EXTREMITY MMT:  MMT Right eval Left eval  Hip flexion 2/5 4/5  Hip extension    Hip abduction 3/5 4/5  Hip adduction    Hip internal rotation    Hip external rotation    Knee flexion 4-/5 5/5  Knee extension 4/5 pain 5/5  Ankle dorsiflexion 4/5 4/5  Ankle plantarflexion    Ankle inversion    Ankle eversion     (Blank rows = not tested)  LOWER EXTREMITY SPECIAL TESTS:  NT  FUNCTIONAL TESTS:  5 times sit to stand: TBD 6 minute walk test: TBD  GAIT: Distance walked: in clinic Assistive device utilized: Walker - 2 wheeled Level of assistance: Modified independence Comments: Moderate R antalgic gait pattern with limited R hip flexion/ step length.  Pt. Reports increase R hip pain with increase activity.   STS: 26.33 sec.  No UE assist/ slight increase in R hip discomfort during 4-5 reps.                                                                                                                                TREATMENT DATE: 09/11/2023  Subjective:  Pt. Reports R  hip stiffness/soreness this morning and c/o 7/10 pain NPS.  Pt. Returns to MD in 3 weeks. Pt. Drove to store last week with no issues driving but limited with walking around store secondary R hip fatigue/ discomfort.  Pt. Continues to benefit from use of [facility] for safety with walking.    There.ex.:  Nu-Step 10 minutes at L4/seat 10-9.  B LE only.  No increase c/o pain.    Total gym squats L22 3x15. RPE 3/10 and NPS 5/10 (tender) Total gym heel-raises L22 3x15. RPE 3/10 and NPS 0/10.  :  1,032 feet.  Improved gait pattern/ arm swing.    Nautilus resisted gait: 70# forward/backwards and 50# lateral 8x each.  Pain 4/10 (improving during tx.).    Walking 6"/12" hurdles in //-bars with focus on hip flexion/ preventing hip circumduction. Min. UE assist.   2 12" Lateral hurdles to up and down 4 stairs. Min. UE assist on hand rails. x10 RPE 6/10 and NPS 6/10.   12" Step-up 3x10 at staircase. Mod. UE assist on handrails. RPE 8/10 and NPS 7/10  Supine R hip marching 10x with added SAQ and holds.  Pt. Unable to complete SLR without assist at this time.    Supine R hip manual isometrics (moderate resistance)- 10x with no pain.    Manual tx.:  Supine hamstring/ hip abduction/ adduction/ gentle IR/ER stretches with holds.  5x each.  Good incision healing  Pt. Will ice R hip at home.       PATIENT EDUCATION:  Education details: Discussed HEP ([id number]), ice pack.  Person educated: Patient and Spouse Education method: Explanation, Demonstration, and Handouts Education comprehension: verbalized understanding and returned demonstration  HOME EXERCISE PROGRAM: Access Code: [email or web address] URL: [email or web address] Date: 08/21/2023 Prepared by: Dorene Grebe  Exercises - Standing March with Counter Support   - 2 x daily - 7 x weekly - 2 sets - 10 reps - Standing Hip Abduction with Unilateral Counter Support  - 2 x daily - 7 x weekly - 2 sets - 10 reps - Heel Raises with Counter Support  - 2 x daily - 7 x weekly - 2 sets - 10 reps - Supine Heel Slide  - 2 x daily - 7 x weekly - 2 sets - 10 reps - Supine Gluteal Sets  - 2 x daily - 7 x weekly - 2 sets - 10 reps - Supine Single Leg Ankle Pumps  - 2 x daily - 7 x weekly - 2 sets - 10 reps - Seated Knee Extension AROM  - 2 x daily - 7 x weekly - 2 sets - 10 reps.  ASSESSMENT:  CLINICAL IMPRESSION: Pt. Reports moderate R hip pain prior to tx. session and enters PT with use of SPC.  Pt. Understands current HEP and demonstrates increase R hip/ knee control.  Pt. Ambulates with improved gait pattern without use of SPC for safety/ balance and no increase c/o pain.  Good incision healing and pt. Using scar cream on incision.  Pt. Was highly compliant with all exercises and interventions performed during today's session.   Pt. Will benefit from skilled PT services to increase R hip ROM/ strength to improve pain-free functional mobility/ return to work.     OBJECTIVE IMPAIRMENTS: Abnormal gait, decreased activity tolerance, decreased balance, decreased endurance, decreased mobility, difficulty walking, decreased ROM, decreased strength, increased edema, impaired flexibility, improper body mechanics, postural dysfunction, and pain.   ACTIVITY LIMITATIONS: carrying, lifting, bending, sitting, standing, squatting, stairs, transfers, and locomotion level  PARTICIPATION LIMITATIONS: cleaning, driving, community activity, occupation, and yard work  PERSONAL FACTORS: Fitness and Past/current experiences are also affecting patient's functional outcome.   REHAB POTENTIAL: Good  CLINICAL DECISION MAKING: Stable/uncomplicated  EVALUATION COMPLEXITY: Low   GOALS: Goals reviewed with patient? Yes  SHORT TERM GOALS: Target date: 09/18/23 Pt. Independent with HEP to  increase R hip AROM to WNL as compared to L hip to improve transfers/ gait with no assistive device.  Baseline:  see above Goal status: INITIAL  LONG TERM GOALS: Target date: 10/16/23 Pt. Will increase FOTO to 64 to improve pain-free functional mobility.   Baseline: initial 36 Goal status: INITIAL  2.  Pt. Will increase R hip strength to 4/5 MMT to improve ability to step into truck.   Baseline: see above Goal status: INITIAL  3.  Pt. Will ambulate community distances with normalized gait pattern and no increase c/o pain independently to promote return to work.   Baseline: Mod. I with RW/ moderate R antalgic gait.   Goal status: INITIAL  4.  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    Baseline: currently not able to work  Goal status: INITIAL  PLAN:  PT FREQUENCY: 2x/week  PT DURATION: 8 weeks  PLANNED INTERVENTIONS: 97110-Therapeutic exercises, 97530- Therapeutic activity, [id number]- Neuromuscular re-education, 97535- Self Care, [id number]- Manual therapy, [phone]- Gait training, Patient/Family education, Balance training, Stair training, Cryotherapy, and Moist heat  PLAN FOR NEXT SESSION:  Progress gait trainint/ADLs and total gym exercises. Recheck SLR   Cammie Mcgee, PT, DPT # [id number] [place]age, SPT 09/11/2023, 9:02 AM

## 2023-09-13 ENCOUNTER — Encounter: Payer: Self-pay | Admitting: Physical Therapy

## 2023-09-13 ENCOUNTER — Ambulatory Visit: Payer: BC Managed Care – PPO | Admitting: Physical Therapy

## 2023-09-13 DIAGNOSIS — R269 Unspecified abnormalities of gait and mobility: Secondary | ICD-10-CM

## 2023-09-13 DIAGNOSIS — M6281 Muscle weakness (generalized): Secondary | ICD-10-CM | POA: Diagnosis not present

## 2023-09-13 DIAGNOSIS — M25551 Pain in right hip: Secondary | ICD-10-CM

## 2023-09-13 DIAGNOSIS — M25651 Stiffness of right hip, not elsewhere classified: Secondary | ICD-10-CM

## 2023-09-13 DIAGNOSIS — Z96641 Presence of right artificial hip joint: Secondary | ICD-10-CM | POA: Diagnosis not present

## 2023-09-13 NOTE — Therapy (Signed)
OUTPATIENT PHYSICAL THERAPY LOWER EXTREMITY TREATMENT  Patient Name: Duane Grant MRN: [id number] DOB:January 14, 1956, 68 y.o., male Today's Date: 09/13/2023  END OF SESSION:  PT End of Session - 09/13/23 0802     Visit Number 8    Number of Visits 16    Date for PT Re-Evaluation 10/16/23    PT Start Time 0802    PT Stop Time 0856    PT Time Calculation (min) 54 min            Past Medical History:  Diagnosis Date   Arthritis    Cancer (HCC) 2009   Prostate, radiation but no surgery   Diabetes mellitus without complication (HCC)    Elevated PSA    H/O removal of cyst 12/02/2019   back    History of prostate cancer    Hyperlipidemia    Hypertension    Past Surgical History:  Procedure Laterality Date   COLONOSCOPY WITH PROPOFOL N/A 01/16/2023   Procedure: COLONOSCOPY WITH PROPOFOL;  Surgeon: Toney Reil, MD;  Location: [facility];  Service: Gastroenterology;  Laterality: N/A;   HERNIA REPAIR  2010   Umbilical   HERNIA REPAIR  2010   JOINT REPLACEMENT     Removal Fatty Tumor Right 2015   [facility], right side of chin/jaw   TOOTH EXTRACTION  12/15/2020   TOTAL HIP ARTHROPLASTY Left 06/09/2016   Procedure: TOTAL HIP ARTHROPLASTY;  Surgeon: Juanell Fairly, MD;  Location: [facility] OR[facility];  Service: Orthopedics;  Laterality: Left;   Patient Active Problem List   Diagnosis Date Noted   Positive colorectal cancer screening using Cologuard test 01/16/2023   Cecal polyp 01/16/2023   Adenomatous polyp of colon 01/16/2023   Encounter for Department of Transportation (DOT) examination for trucking license 29/52/8413   Hearing loss, left 06/10/2020   Drug-induced myopathy 05/11/2020   Chronic bilateral low back pain without sciatica 12/23/2019   Hyperlipidemia associated with type 2 diabetes mellitus (HCC) 11/11/2019   Recurrent HSV (herpes simplex virus) 03/11/2019   Arthritis of carpometacarpal (CMC) joint of left thumb 03/11/2019   Obesity (BMI 30.0-34.9)  03/11/2019   Status post total hip replacement, left 06/09/2016   Hip pain 05/18/2016   Type 2 diabetes mellitus with other specified complication (HCC) 02/06/2014   Prostate cancer (HCC) 02/06/2014   Parotid mass 12/23/2013   Essential hypertension 12/06/2013   Herpesviral infection of urogenital system 12/06/2013   Erectile dysfunction 12/06/2013   Neck mass 12/06/2013    PCP: Smitty Cords, DO  REFERRING PROVIDER: Altamese Cabal, PA-C  REFERRING DIAG: Presence of unspecified artificial hip joint   THERAPY DIAG:  History of total hip replacement, right  Hip joint stiffness, right  Muscle weakness (generalized)  Gait difficulty  Pain in right hip  Rationale for Evaluation and Treatment: Rehabilitation  ONSET DATE: 08/10/23  SUBJECTIVE:   SUBJECTIVE STATEMENT: Pt. S/p R posterior hip replacement on 08/10/23.  Pt. Returns to MD on 08/22/23 for bandage removal/ reassessment.  Pt. Reports 7-8/10 R hip pain at rest and is hoping to get more pain meds at MD f/u tomorrow.  Pt. Reports 3/10 R hip pain at best with ice/ medications.  Pt. Reports no falls and is currently using RW to ambulate.  Pt. Currently sleeping in recliner chair.    PERTINENT HISTORY: See MD note.  No L hip issues.    PAIN:  Are you having pain? Yes: NPRS scale: 7-8/10 Pain location: R hip Pain description: aching Aggravating factors: walking/ movement Relieving  factors: ice/ medications  PRECAUTIONS: Anterior hip  RED FLAGS: None   WEIGHT BEARING RESTRICTIONS: No  FALLS:  Has patient fallen in last 6 months? 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    PLOF: Independent  PATIENT GOALS: Increase R hip ROM/ strength/ ambulate with no assistive device.   Decrease pain/ promote return to work.    NEXT MD VISIT: 08/22/23  OBJECTIVE:  Note: Objective measures were completed at Evaluation unless otherwise noted.  DIAGNOSTIC FINDINGS: See MD notes  PATIENT SURVEYS:  FOTO initial 36/ goal 63  COGNITION: Overall cognitive status: Within functional limits for tasks assessed   SENSATION: WFL  EDEMA:  Moderate R lower leg swelling.  (+) pitting edema in R lower leg/ ankle.  Educated pt. On compression stockings/ ankle pumps/ elevating.    MUSCLE LENGTH: Hamstrings: Right 62 deg (pain); Left 60 deg  POSTURE: rounded shoulders and forward head  PALPATION: R lower leg/ ankle pitting edema.  Unable to palpate R hip secondary to bandage  LOWER EXTREMITY ROM:  Active ROM Right eval Left eval  Hip flexion 65 deg. (AAROM) 101 deg  Hip extension    Hip abduction 30 deg. 55 deg.  Hip adduction    Hip internal rotation    Hip external rotation    Knee flexion 82 deg. (Pain) 125 deg.  Knee extension 6 deg. 0 deg.  Ankle dorsiflexion    Ankle plantarflexion    Ankle inversion    Ankle eversion     (Blank rows = not tested)  LOWER EXTREMITY MMT:  MMT Right eval Left eval  Hip flexion 2/5 4/5  Hip extension    Hip abduction 3/5 4/5  Hip adduction    Hip internal rotation    Hip external rotation    Knee flexion 4-/5 5/5  Knee extension 4/5 pain 5/5  Ankle dorsiflexion 4/5 4/5  Ankle plantarflexion    Ankle inversion    Ankle eversion     (Blank rows = not tested)  LOWER EXTREMITY SPECIAL TESTS:  NT  FUNCTIONAL TESTS:  5 times sit to stand: TBD 6 minute walk test: TBD  GAIT: Distance walked: in clinic Assistive device utilized: Walker - 2 wheeled Level of assistance: Modified independence Comments: Moderate R antalgic gait pattern with limited R hip flexion/ step length.  Pt. Reports increase R hip pain with increase activity.   STS: 26.33 sec.  No UE assist/ slight increase in R hip discomfort during 4-5 reps.                                                                                                                                TREATMENT DATE: 09/13/2023  Subjective:  Pt. Reports R  hip stiffness/soreness this morning and c/o 3/10 pain NPS upon arrival. Pt. Explains that their pain was 9/10 NPS 24 hours after last treatment session and that they were experiencing soreness more than sharp pain. Pt. Returns to MD in 3 weeks. Pt. Is driving short distances and note they are limited with walking longer distances secondary R hip fatigue/ discomfort. Pt. Continues to benefit from use of [facility] for safety with walking.  There.ex.:  Nu-Step 12 minutes at L5/seat 10-9.  B LE only.  No increase c/o pain.    Total gym squats wide, middle, and close foot positioning. L22 3x15. RPE 3/10 and NPS 5/10 (tender)  Total gym heel-raises L22 3x15. RPE 3/10 and NPS 0/10.     Nautilus resisted gait: 80# forward/backwards and 50# lateral 8x each.  Pain 4/10 (improving during tx.).    Walking 6"/12" hurdles in //-bars with focus on hip flexion/ preventing hip circumduction. No UE assist.   2 12" Forward hurdles to up and down 4 stairs. Min. UE assist on hand rails. x10 RPE 6/10 and NPS 6/10. (Cuing for three continuous steps  12" Step-up 3x10 at staircase. Mod. UE assist on handrails. RPE 8/10 and NPS 7/10  Supine R hip marching 10x with added SAQ and holds.  Pt. Unable to complete SLR without assist at this time.    Supine R hip manual isometrics (moderate resistance)- 10x with no pain.    Manual tx.:  Supine hamstring/ hip abduction/ adduction/ gentle IR/ER stretches with holds.  5x each.  Good incision healing  Pt. Will ice R hip at home.       PATIENT EDUCATION:  Education details: Discussed HEP ([id number]), ice pack.  Person educated: Patient and Spouse Education method: Explanation, Demonstration, and Handouts Education comprehension: verbalized understanding and returned demonstration  HOME EXERCISE  PROGRAM: Access Code: [email or web address] URL: [email or web address] Date: 08/21/2023 Prepared by: Dorene Grebe  Exercises - Standing March with Counter Support  - 2 x daily - 7 x weekly - 2 sets - 10 reps - Standing Hip Abduction with Unilateral Counter Support  - 2 x daily - 7 x weekly - 2 sets - 10 reps - Heel Raises with Counter Support  - 2 x daily - 7 x weekly - 2 sets - 10 reps - Supine Heel Slide  - 2 x daily - 7 x weekly - 2 sets - 10 reps - Supine Gluteal Sets  - 2 x daily - 7 x weekly - 2 sets - 10 reps - Supine Single Leg Ankle Pumps  - 2 x daily - 7 x weekly - 2 sets - 10 reps - Seated Knee Extension AROM  - 2 x daily - 7 x weekly - 2 sets - 10 reps.  ASSESSMENT:  CLINICAL IMPRESSION: Pt. Reports moderate R hip pain prior to tx. session and enters PT with use of SPC. Pt. Notes residual soreness from last treatment session and adjustments were made during the treatment session to decrease total exercise volume for today's session. Pt. Has been compliant with current HEP and demonstrates increase R hip/ knee control with all ambulatory exercises.  Pt. Is still ambulating within the community with use of SPC for safety/ balance. Good incision healing and pt. Is continuing to use scar cream on incision.  Pt. Demonstrated increased exercise performance related to global endurance and weight training interventions performed during today's session.  Pt. Will benefit from skilled PT services to increase R hip ROM/ strength to improve pain-free functional mobility/ return  to work.     OBJECTIVE IMPAIRMENTS: Abnormal gait, decreased activity tolerance, decreased balance, decreased endurance, decreased mobility, difficulty walking, decreased ROM, decreased strength, increased edema, impaired flexibility, improper body mechanics, postural dysfunction, and pain.   ACTIVITY LIMITATIONS: carrying, lifting, bending, sitting, standing, squatting, stairs, transfers, and locomotion  level  PARTICIPATION LIMITATIONS: cleaning, driving, community activity, occupation, and yard work  PERSONAL FACTORS: Fitness and Past/current experiences are also affecting patient's functional outcome.   REHAB POTENTIAL: Good  CLINICAL DECISION MAKING: Stable/uncomplicated  EVALUATION COMPLEXITY: Low   GOALS: Goals reviewed with patient? Yes  SHORT TERM GOALS: Target date: 09/18/23 Pt. Independent with HEP to increase R hip AROM to WNL as compared to L hip to improve transfers/ gait with no assistive device.  Baseline:  see above Goal status: INITIAL  LONG TERM GOALS: Target date: 10/16/23 Pt. Will increase FOTO to 64 to improve pain-free functional mobility.   Baseline: initial 36 Goal status: INITIAL  2.  Pt. Will increase R hip strength to 4/5 MMT to improve ability to step into truck.   Baseline: see above Goal status: INITIAL  3.  Pt. Will ambulate community distances with normalized gait pattern and no increase c/o pain independently to promote return to work.   Baseline: Mod. I with RW/ moderate R antalgic gait.   Goal status: INITIAL  4.  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    Baseline: currently not able to work  Goal status: INITIAL  PLAN:  PT FREQUENCY: 2x/week  PT DURATION: 8 weeks  PLANNED INTERVENTIONS: 97110-Therapeutic exercises, 97530- Therapeutic activity, [id number]- Neuromuscular re-education, 97535- Self Care, 54098- Manual therapy, [phone]- Gait training, Patient/Family education, Balance training, Stair training, Cryotherapy, and Moist heat  PLAN FOR NEXT SESSION:  Recheck SLR, assess LE MMT strength  Maylon Peppers, PT, DPT Physical Therapist - [facility]  [place], SPT 09/13/2023, 8:56 AM

## 2023-09-18 ENCOUNTER — Ambulatory Visit: Payer: BC Managed Care – PPO | Attending: Orthopedic Surgery | Admitting: Physical Therapy

## 2023-09-18 DIAGNOSIS — M25551 Pain in right hip: Secondary | ICD-10-CM | POA: Diagnosis not present

## 2023-09-18 DIAGNOSIS — M25651 Stiffness of right hip, not elsewhere classified: Secondary | ICD-10-CM | POA: Insufficient documentation

## 2023-09-18 DIAGNOSIS — R269 Unspecified abnormalities of gait and mobility: Secondary | ICD-10-CM | POA: Insufficient documentation

## 2023-09-18 DIAGNOSIS — Z96641 Presence of right artificial hip joint: Secondary | ICD-10-CM | POA: Insufficient documentation

## 2023-09-18 DIAGNOSIS — M6281 Muscle weakness (generalized): Secondary | ICD-10-CM | POA: Diagnosis not present

## 2023-09-18 NOTE — Therapy (Addendum)
OUTPATIENT PHYSICAL THERAPY LOWER EXTREMITY TREATMENT  Patient Name: Duane Grant MRN: [id number] DOB:1956-03-29, 68 y.o., male Today's Date: 09/18/2023  END OF SESSION:  PT End of Session - 09/18/23 0724     Visit Number 9    Number of Visits 16    Date for PT Re-Evaluation 10/16/23    PT Start Time 0724    PT Stop Time 0819    PT Time Calculation (min) 55 min            Past Medical History:  Diagnosis Date   Arthritis    Cancer (HCC) 2009   Prostate, radiation but no surgery   Diabetes mellitus without complication (HCC)    Elevated PSA    H/O removal of cyst 12/02/2019   back    History of prostate cancer    Hyperlipidemia    Hypertension    Past Surgical History:  Procedure Laterality Date   COLONOSCOPY WITH PROPOFOL N/A 01/16/2023   Procedure: COLONOSCOPY WITH PROPOFOL;  Surgeon: Toney Reil, MD;  Location: [facility];  Service: Gastroenterology;  Laterality: N/A;   HERNIA REPAIR  2010   Umbilical   HERNIA REPAIR  2010   JOINT REPLACEMENT     Removal Fatty Tumor Right 2015   [facility], right side of chin/jaw   TOOTH EXTRACTION  12/15/2020   TOTAL HIP ARTHROPLASTY Left 06/09/2016   Procedure: TOTAL HIP ARTHROPLASTY;  Surgeon: Juanell Fairly, MD;  Location: [facility] OR[facility];  Service: Orthopedics;  Laterality: Left;   Patient Active Problem List   Diagnosis Date Noted   Positive colorectal cancer screening using Cologuard test 01/16/2023   Cecal polyp 01/16/2023   Adenomatous polyp of colon 01/16/2023   Encounter for Department of Transportation (DOT) examination for trucking license 78/29/5621   Hearing loss, left 06/10/2020   Drug-induced myopathy 05/11/2020   Chronic bilateral low back pain without sciatica 12/23/2019   Hyperlipidemia associated with type 2 diabetes mellitus (HCC) 11/11/2019   Recurrent HSV (herpes simplex virus) 03/11/2019   Arthritis of carpometacarpal (CMC) joint of left thumb 03/11/2019   Obesity (BMI 30.0-34.9)  03/11/2019   Status post total hip replacement, left 06/09/2016   Hip pain 05/18/2016   Type 2 diabetes mellitus with other specified complication (HCC) 02/06/2014   Prostate cancer (HCC) 02/06/2014   Parotid mass 12/23/2013   Essential hypertension 12/06/2013   Herpesviral infection of urogenital system 12/06/2013   Erectile dysfunction 12/06/2013   Neck mass 12/06/2013    PCP: Smitty Cords, DO  REFERRING PROVIDER: Altamese Cabal, PA-C  REFERRING DIAG: Presence of unspecified artificial hip joint   THERAPY DIAG:  History of total hip replacement, right  Hip joint stiffness, right  Muscle weakness (generalized)  Gait difficulty  Pain in right hip  Rationale for Evaluation and Treatment: Rehabilitation  ONSET DATE: 08/10/23  SUBJECTIVE:   SUBJECTIVE STATEMENT: Pt. S/p R posterior hip replacement on 08/10/23.  Pt. Returns to MD on 08/22/23 for bandage removal/ reassessment.  Pt. Reports 7-8/10 R hip pain at rest and is hoping to get more pain meds at MD f/u tomorrow.  Pt. Reports 3/10 R hip pain at best with ice/ medications.  Pt. Reports no falls and is currently using RW to ambulate.  Pt. Currently sleeping in recliner chair.    PERTINENT HISTORY: See MD note.  No L hip issues.    PAIN:  Are you having pain? Yes: NPRS scale: 7-8/10 Pain location: R hip Pain description: aching Aggravating factors: walking/ movement Relieving  factors: ice/ medications  PRECAUTIONS: Anterior hip  RED FLAGS: None   WEIGHT BEARING RESTRICTIONS: No  FALLS:  Has patient fallen in last 6 months? 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    PLOF: Independent  PATIENT GOALS: Increase R hip ROM/ strength/ ambulate with no assistive device.   Decrease pain/ promote return to work.    NEXT MD VISIT: 08/22/23  OBJECTIVE:  Note: Objective measures were completed at Evaluation unless otherwise noted.  DIAGNOSTIC FINDINGS: See MD notes  PATIENT SURVEYS:  FOTO initial 36/ goal 38  COGNITION: Overall cognitive status: Within functional limits for tasks assessed   SENSATION: WFL  EDEMA:  Moderate R lower leg swelling.  (+) pitting edema in R lower leg/ ankle.  Educated pt. On compression stockings/ ankle pumps/ elevating.    MUSCLE LENGTH: Hamstrings: Right 62 deg (pain); Left 60 deg  POSTURE: rounded shoulders and forward head  PALPATION: R lower leg/ ankle pitting edema.  Unable to palpate R hip secondary to bandage  LOWER EXTREMITY ROM:  Active ROM Right eval Left eval  Hip flexion 65 deg. (AAROM) 101 deg  Hip extension    Hip abduction 30 deg. 55 deg.  Hip adduction    Hip internal rotation    Hip external rotation    Knee flexion 82 deg. (Pain) 125 deg.  Knee extension 6 deg. 0 deg.  Ankle dorsiflexion    Ankle plantarflexion    Ankle inversion    Ankle eversion     (Blank rows = not tested)  LOWER EXTREMITY MMT:  MMT Right eval Left eval  Hip flexion 2/5 4/5  Hip extension    Hip abduction 3/5 4/5  Hip adduction    Hip internal rotation    Hip external rotation    Knee flexion 4-/5 5/5  Knee extension 4/5 pain 5/5  Ankle dorsiflexion 4/5 4/5  Ankle plantarflexion    Ankle inversion    Ankle eversion     (Blank rows = not tested)  LOWER EXTREMITY SPECIAL TESTS:  NT  FUNCTIONAL TESTS:  5 times sit to stand: TBD 6 minute walk test: TBD  GAIT: Distance walked: in clinic Assistive device utilized: Walker - 2 wheeled Level of assistance: Modified independence Comments: Moderate R antalgic gait pattern with limited R hip flexion/ step length.  Pt. Reports increase R hip pain with increase activity.   STS: 26.33 sec.  No UE assist/ slight increase in R hip discomfort during 4-5 reps.                                                                                                                                TREATMENT DATE: 09/18/2023  Subjective:    Pt.  Reports R hip stiffness/soreness this morning and c/o 0/10 pain NPS upon arrival. Pt. Explains that their recent discomfort has been more of a global muscle soreness. Pt. Returns to MD in 2 weeks. Pt. Describes that increasing their movement alleviates their symptom of soreness and discomfort. Pt. States they have been going up and down 19 steps 2-3 times per day and plans to incorporate more walking this coming week. Pt. Is driving short distances and note they are still limited with walking longer distances secondary R hip fatigue/ discomfort. Pt. Continues to benefit from use of [facility] for safety with walking.  There.ex.:  Nu-Step 12 minutes at L5/seat 10-9.  B LE only.  No increase c/o pain.    Total gym squats wide, middle, and close foot positioning. L22 3x20. RPE 3/10 and NPS 6/10 (stretching discomfort with increased ROM)  Total gym heel-raises L22 3x20. RPE 3/10 and NPS 0/10.  MMT Right Left  Hip flexion 4-/5 4/5  Hip extension 4-/5 3+/5  Hip abduction 4/5 4/5  Hip adduction    Hip internal rotation    Hip external rotation    Knee flexion 4/5 4/5  Knee extension 4/5 (sore) 5/5  Ankle dorsiflexion 5/5 5/5  Ankle plantarflexion    Ankle inversion    Ankle eversion     (Blank rows = not tested)    Supine R SLR:  5x (marked improvement).    Supine hamstring/ hip abduction/ adduction/ gentle IR/ER stretches with holds.  5x each.  There.act.:  Nautilus resisted gait: 105# forward/backwards and 70# lateral 10x each.  NPS 4/10 (Significant resistance/weight improvement).    2 12" Forward hurdles to up and down 4 stairs. Min. UE assist on hand rails. x10 RPE 6/10 and NPS 6/10. (Cuing for three continuous steps  12" Step-up 3x10 at staircase. Mod. UE assist on handrails. RPE 8/10 and NPS 7/10   Pt. Will ice R hip  at home.       PATIENT EDUCATION:  Education details: Discussed HEP ([id number]), ice pack.  Person educated: Patient and Spouse Education method: Explanation, Demonstration, and Handouts Education comprehension: verbalized understanding and returned demonstration  HOME EXERCISE PROGRAM: Access Code: [email or web address] URL: [email or web address] Date: 08/21/2023 Prepared by: Dorene Grebe  Exercises - Standing March with Counter Support  - 2 x daily - 7 x weekly - 2 sets - 10 reps - Standing Hip Abduction with Unilateral Counter Support  - 2 x daily - 7 x weekly - 2 sets - 10 reps - Heel Raises with Counter Support  - 2 x daily - 7 x weekly - 2 sets - 10 reps - Supine Heel Slide  - 2 x daily - 7 x weekly - 2 sets - 10 reps - Supine Gluteal Sets  - 2 x daily - 7 x weekly - 2 sets - 10 reps - Supine Single Leg Ankle Pumps  - 2 x daily - 7 x weekly - 2 sets - 10 reps - Seated Knee Extension AROM  - 2 x daily - 7 x weekly - 2 sets - 10 reps.  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 Today's treatment focused on progressing therapeutic exercise difficulty and reassessing B LE strength (see chart above). Pt. Is progressing well with activities and interventions that incorporate work-related tasks. Pt. 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 Pt. Has MD Appointment this coming Friday for s/p  5 weeks R THA this week. Pt. Discusses that they plan to progress their ADLs and IADLs according to their confidence in their hip. Pt. Explains that they would like to continue with therapy 2x per week for 4 weeks based on their progress over the past 4 weeks of rehabilitation s/p R THA. They note consistent progress with total walking distance within their apartment complex at this time. Pt. Is  still ambulating within the community with use of SPC for safety/ balance. Good incision healing and pt. is continuing to use scar cream on incision.  Pt. Demonstrated increased exercise performance related to global endurance and weight training interventions performed during today's session.  Pt. Will benefit from skilled PT services to increase R hip ROM/ strength to improve pain-free functional mobility/ return to work.        OBJECTIVE IMPAIRMENTS: Abnormal gait, decreased activity tolerance, decreased balance, decreased endurance, decreased mobility, difficulty walking, decreased ROM, decreased strength, increased edema, impaired flexibility, improper body mechanics, postural dysfunction, and pain.   ACTIVITY LIMITATIONS: carrying, lifting, bending, sitting, standing, squatting, stairs, transfers, and locomotion level  PARTICIPATION LIMITATIONS: cleaning, driving, community activity, occupation, and yard work  PERSONAL FACTORS: Fitness and Past/current experiences are also affecting patient's functional outcome.   REHAB POTENTIAL: Good  CLINICAL DECISION MAKING: Stable/uncomplicated  EVALUATION COMPLEXITY: Low   GOALS: Goals reviewed with patient? Yes  SHORT TERM GOALS: Target date: 09/18/23 Pt. Independent with HEP to increase R hip AROM to WNL as compared to L hip to improve transfers/ gait with no assistive device.  Baseline:  see above Goal status: Partially met  LONG TERM GOALS: Target date: 10/16/23 Pt. Will increase FOTO to 64 to improve pain-free functional mobility.   Baseline: initial 36 Goal status: INITIAL  2.  Pt. Will increase R hip strength to 4/5 MMT to improve ability to step into truck.   Baseline: see above Goal status: INITIAL  3.  Pt. Will ambulate community distances with normalized gait pattern and no increase c/o pain independently to promote return to work.   Baseline: Mod. I with RW/ moderate R antalgic gait.   Goal status: INITIAL  4.  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    Baseline: currently not able to work  Goal status: INITIAL  PLAN:  PT FREQUENCY: 2x/week  PT DURATION: 8 weeks  PLANNED INTERVENTIONS: 97110-Therapeutic exercises, 97530- Therapeutic activity, [id number]- Neuromuscular re-education, 97535- Self Care, [id number]- Manual therapy, [id number]- Gait training, Patient/Family education, Balance training, Stair training, Cryotherapy, and Moist heat  PLAN FOR NEXT SESSION:  Assess and FOTO  Cammie Mcgee, PT, DPT # [phone] Physical Therapist - [facility]  [place], SPT 09/18/2023, 8:32 AM

## 2023-09-19 DIAGNOSIS — Z79818 Long term (current) use of other agents affecting estrogen receptors and estrogen levels: Secondary | ICD-10-CM | POA: Diagnosis not present

## 2023-09-19 DIAGNOSIS — C61 Malignant neoplasm of prostate: Secondary | ICD-10-CM | POA: Diagnosis not present

## 2023-09-19 DIAGNOSIS — R232 Flushing: Secondary | ICD-10-CM | POA: Diagnosis not present

## 2023-09-19 DIAGNOSIS — Z5111 Encounter for antineoplastic chemotherapy: Secondary | ICD-10-CM | POA: Diagnosis not present

## 2023-09-19 DIAGNOSIS — M8589 Other specified disorders of bone density and structure, multiple sites: Secondary | ICD-10-CM | POA: Diagnosis not present

## 2023-09-19 DIAGNOSIS — Z87891 Personal history of nicotine dependence: Secondary | ICD-10-CM | POA: Diagnosis not present

## 2023-09-20 ENCOUNTER — Ambulatory Visit: Payer: BC Managed Care – PPO | Admitting: Physical Therapy

## 2023-09-20 ENCOUNTER — Encounter: Payer: Self-pay | Admitting: Physical Therapy

## 2023-09-20 DIAGNOSIS — M25551 Pain in right hip: Secondary | ICD-10-CM | POA: Diagnosis not present

## 2023-09-20 DIAGNOSIS — M6281 Muscle weakness (generalized): Secondary | ICD-10-CM | POA: Diagnosis not present

## 2023-09-20 DIAGNOSIS — Z96641 Presence of right artificial hip joint: Secondary | ICD-10-CM | POA: Diagnosis not present

## 2023-09-20 DIAGNOSIS — R269 Unspecified abnormalities of gait and mobility: Secondary | ICD-10-CM | POA: Diagnosis not present

## 2023-09-20 DIAGNOSIS — M25651 Stiffness of right hip, not elsewhere classified: Secondary | ICD-10-CM

## 2023-09-20 NOTE — Therapy (Signed)
 OUTPATIENT PHYSICAL THERAPY LOWER EXTREMITY TREATMENT Physical Therapy Progress Note  Dates of reporting period  08/21/23   to   09/20/23   Patient Name: Duane Grant MRN: [id number] DOB:May 28, 1956, 67 y.o., male Today's Date: 09/20/2023  END OF SESSION:  PT End of Session - 09/20/23 0714     Visit Number 10    Number of Visits 16    Date for PT Re-Evaluation 10/16/23    PT Start Time 0708            Past Medical History:  Diagnosis Date   Arthritis    Cancer (HCC) 2009   Prostate, radiation but no surgery   Diabetes mellitus without complication (HCC)    Elevated PSA    H/O removal of cyst 12/02/2019   back    History of prostate cancer    Hyperlipidemia    Hypertension    Past Surgical History:  Procedure Laterality Date   COLONOSCOPY WITH PROPOFOL  N/A 01/16/2023   Procedure: COLONOSCOPY WITH PROPOFOL ;  Surgeon: Unk Corinn Skiff, MD;  Location: [facility];  Service: Gastroenterology;  Laterality: N/A;   HERNIA REPAIR  2010   Umbilical   HERNIA REPAIR  2010   JOINT REPLACEMENT     Removal Fatty Tumor Right 2015   [facility], right side of chin/jaw   TOOTH EXTRACTION  12/15/2020   TOTAL HIP ARTHROPLASTY Left 06/09/2016   Procedure: TOTAL HIP ARTHROPLASTY;  Surgeon: Franky Cranker, MD;  Location: [facility] ORS;  Service: Orthopedics;  Laterality: Left;   Patient Active Problem List   Diagnosis Date Noted   Positive colorectal cancer screening using Cologuard test 01/16/2023   Cecal polyp 01/16/2023   Adenomatous polyp of colon 01/16/2023   Encounter for Department of Transportation (DOT) examination for trucking license 98/87/7977   Hearing loss, left 06/10/2020   Drug-induced myopathy 05/11/2020   Chronic bilateral low back pain without sciatica 12/23/2019   Hyperlipidemia associated with type 2 diabetes mellitus (HCC) 11/11/2019   Recurrent HSV (herpes simplex virus) 03/11/2019   Arthritis of carpometacarpal (CMC) joint of left thumb 03/11/2019    Obesity (BMI 30.0-34.9) 03/11/2019   Status post total hip replacement, left 06/09/2016   Hip pain 05/18/2016   Type 2 diabetes mellitus with other specified complication (HCC) 02/06/2014   Prostate cancer (HCC) 02/06/2014   Parotid mass 12/23/2013   Essential hypertension 12/06/2013   Herpesviral infection of urogenital system 12/06/2013   Erectile dysfunction 12/06/2013   Neck mass 12/06/2013    PCP: Edman Marsa PARAS, DO  REFERRING PROVIDER: Joshua Lin, PA-C  REFERRING DIAG: Presence of unspecified artificial hip joint   THERAPY DIAG:  History of total hip replacement, right  Hip joint stiffness, right  Muscle weakness (generalized)  Gait difficulty  Pain in right hip  Rationale for Evaluation and Treatment: Rehabilitation  ONSET DATE: 08/10/23  SUBJECTIVE:   SUBJECTIVE STATEMENT: Pt. S/p R posterior hip replacement on 08/10/23.  Pt. Returns to MD on 08/22/23 for bandage removal/ reassessment.  Pt. Reports 7-8/10 R hip pain at rest and is hoping to get more pain meds at MD f/u tomorrow.  Pt. Reports 3/10 R hip pain at best with ice/ medications.  Pt. Reports no falls and is currently using RW to ambulate.  Pt. Currently sleeping in recliner chair.    PERTINENT HISTORY: See MD note.  No L hip issues.    PAIN:  Are you having pain? Yes: NPRS scale: 7-8/10 Pain location: R hip Pain description: aching Aggravating factors: walking/  movement Relieving factors: ice/ medications  PRECAUTIONS: Anterior hip  RED FLAGS: None   WEIGHT BEARING RESTRICTIONS: No  FALLS:  Has patient fallen in last 6 months? 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    PLOF: Independent  PATIENT GOALS: Increase R hip ROM/ strength/ ambulate with  no assistive device.  Decrease pain/ promote return to work.    NEXT MD VISIT: 08/22/23  OBJECTIVE:  Note: Objective measures were completed at Evaluation unless otherwise noted.  DIAGNOSTIC FINDINGS: See MD notes  PATIENT SURVEYS:  FOTO initial 36/ goal 50  COGNITION: Overall cognitive status: Within functional limits for tasks assessed   SENSATION: WFL  EDEMA:  Moderate R lower leg swelling.  (+) pitting edema in R lower leg/ ankle.  Educated pt. On compression stockings/ ankle pumps/ elevating.    MUSCLE LENGTH: Hamstrings: Right 62 deg (pain); Left 60 deg  POSTURE: rounded shoulders and forward head  PALPATION: R lower leg/ ankle pitting edema.  Unable to palpate R hip secondary to bandage  LOWER EXTREMITY ROM:  Active ROM Right eval Left eval  Hip flexion 65 deg. (AAROM) 101 deg  Hip extension    Hip abduction 30 deg. 55 deg.  Hip adduction    Hip internal rotation    Hip external rotation    Knee flexion 82 deg. (Pain) 125 deg.  Knee extension 6 deg. 0 deg.  Ankle dorsiflexion    Ankle plantarflexion    Ankle inversion    Ankle eversion     (Blank rows = not tested)  LOWER EXTREMITY MMT:  MMT Right eval Left eval  Hip flexion 2/5 4/5  Hip extension    Hip abduction 3/5 4/5  Hip adduction    Hip internal rotation    Hip external rotation    Knee flexion 4-/5 5/5  Knee extension 4/5 pain 5/5  Ankle dorsiflexion 4/5 4/5  Ankle plantarflexion    Ankle inversion    Ankle eversion     (Blank rows = not tested)  LOWER EXTREMITY SPECIAL TESTS:  NT  FUNCTIONAL TESTS:  5 times sit to stand: TBD 6 minute walk test: TBD  GAIT: Distance walked: in clinic Assistive device utilized: Walker - 2 wheeled Level of assistance: Modified independence Comments: Moderate R antalgic gait pattern with limited R hip flexion/ step length.  Pt. Reports increase R hip pain with increase activity.   STS: 26.33 sec.  No UE assist/ slight increase in R hip discomfort  during 4-5 reps.    09/18/23: MMT Right Left  Hip flexion 4-/5 4/5  Hip extension 4-/5 3+/5  Hip abduction 4/5 4/5  Hip adduction    Hip internal rotation    Hip external rotation    Knee flexion 4/5 4/5  Knee extension 4/5 (sore) 5/5  Ankle dorsiflexion 5/5 5/5  Ankle plantarflexion    Ankle inversion    Ankle eversion     (Blank rows = not tested)  TREATMENT DATE: 09/20/2023  Subjective:    Pt. Reports R hip stiffness/soreness this morning and c/o 0-2/10 pain NPS upon arrival.  Pt. Had a long day yesterday at [facility] for prostate CA reassessment.  Pt. Reports PSA numbers are really good and pt. Will continue with current POC.    There.ex.:  Nu-Step 12 minutes at L5/seat 10.  B LE only.  No increase c/o pain.  Discussed MD appt. Yesterday.      STS: 10.50 seconds.  No UE assist/ slight increase in R hip discomfort with initial stand.     Total gym squats wide, middle, and close foot positioning. L22 3x20. RPE 3/10 and NPS 6/10 (stretching discomfort with increased ROM)  Total gym heel-raises L22 3x20. RPE 3/10 and NPS 0/10.  Supine hamstring/ hip abduction/ adduction/ gentle IR/ER stretches with holds.  5x each..  Supine 90 deg. self-hamstring stretches 10x.   Prone press-ups/ hip flexor stretches.      There.act.:  Walking cone taps at agility ladder (recip. Pattern)- 5 laps.  Sled push/pull 40# for 30 feet in gym 5 laps.    Nautilus: resisted gait 80# 10x each.  No UE assist.  Good heel strike/ toe off.    Issued BTB for lateral walking ex. At home.     Pt. Will ice R hip at home.       PATIENT EDUCATION:  Education details: Discussed HEP ([id number]), ice pack.  Person educated: Patient and Spouse Education method: Explanation, Demonstration, and Handouts Education comprehension: verbalized understanding and returned demonstration  HOME  EXERCISE PROGRAM: Access Code: [email or web address] URL: [email or web address] Date: 08/21/2023 Prepared by: Ozell Sero  Exercises - Standing March with Counter Support  - 2 x daily - 7 x weekly - 2 sets - 10 reps - Standing Hip Abduction with Unilateral Counter Support  - 2 x daily - 7 x weekly - 2 sets - 10 reps - Heel Raises with Counter Support  - 2 x daily - 7 x weekly - 2 sets - 10 reps - Supine Heel Slide  - 2 x daily - 7 x weekly - 2 sets - 10 reps - Supine Gluteal Sets  - 2 x daily - 7 x weekly - 2 sets - 10 reps - Supine Single Leg Ankle Pumps  - 2 x daily - 7 x weekly - 2 sets - 10 reps - Seated Knee Extension AROM  - 2 x daily - 7 x weekly - 2 sets - 10 reps.  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 Today's treatment focused on progressing hip ROM/ generalized strengthening.  Pt. Is progressing well with activities and interventions that incorporate work-related tasks. Marked improvement in 5xSTS with no UE assist.  Pt. Has MD Appointment this coming Friday for s/p  5 weeks R THA this week.  They note consistent progress with total walking distance within their apartment complex at this time. Pt. Is still ambulating within the community with use of SPC for safety/ balance.  Pt. Demonstrated increased exercise performance related to global endurance and weight training interventions performed during today's session.  Pt. Will benefit from skilled PT services to increase R hip ROM/ strength to improve pain-free functional mobility/ return to work.        OBJECTIVE IMPAIRMENTS: Abnormal gait, decreased activity tolerance, decreased balance, decreased endurance, decreased mobility, difficulty walking, decreased ROM, decreased strength, increased edema, impaired flexibility, improper body mechanics, postural dysfunction, and  pain.   ACTIVITY LIMITATIONS: carrying, lifting, bending, sitting, standing,  squatting, stairs, transfers, and locomotion level  PARTICIPATION LIMITATIONS: cleaning, driving, community activity, occupation, and yard work  PERSONAL FACTORS: Fitness and Past/current experiences are also affecting patient's functional outcome.   REHAB POTENTIAL: Good  CLINICAL DECISION MAKING: Stable/uncomplicated  EVALUATION COMPLEXITY: Low   GOALS: Goals reviewed with patient? Yes  SHORT TERM GOALS: Target date: 09/18/23 Pt. Independent with HEP to increase R hip AROM to WNL as compared to L hip to improve transfers/ gait with no assistive device.  Baseline:  see above Goal status: Partially met  LONG TERM GOALS: Target date: 10/16/23 Pt. Will increase FOTO to 64 to improve pain-free functional mobility.   Baseline: initial 36 Goal status: INITIAL  2.  Pt. Will increase R hip strength to 4/5 MMT to improve ability to step into truck.   Baseline: see above Goal status: INITIAL  3.  Pt. Will ambulate community distances with normalized gait pattern and no increase c/o pain independently to promote return to work.   Baseline: Mod. I with RW/ moderate R antalgic gait.   Goal status: INITIAL  4.  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    Baseline: currently not able to work  Goal status: INITIAL  PLAN:  PT FREQUENCY: 2x/week  PT DURATION: 8 weeks  PLANNED INTERVENTIONS: 97110-Therapeutic exercises, 97530- Therapeutic activity, [id number]- Neuromuscular re-education, 97535- Self Care, [id number]- Manual therapy, [phone]- Gait training, Patient/Family education, Balance training, Stair training, Cryotherapy, and Moist heat  PLAN FOR NEXT SESSION:  Discuss MD f/u  Ozell JAYSON Sero, PT, DPT # [phone] Physical Therapist - [facility]  [place], SPT 09/20/2023, 7:15 AM

## 2023-09-22 DIAGNOSIS — Z96641 Presence of right artificial hip joint: Secondary | ICD-10-CM | POA: Diagnosis not present

## 2023-09-25 ENCOUNTER — Ambulatory Visit: Payer: BC Managed Care – PPO | Admitting: Physical Therapy

## 2023-09-25 ENCOUNTER — Encounter: Payer: Self-pay | Admitting: Physical Therapy

## 2023-09-25 DIAGNOSIS — M25551 Pain in right hip: Secondary | ICD-10-CM | POA: Diagnosis not present

## 2023-09-25 DIAGNOSIS — Z96641 Presence of right artificial hip joint: Secondary | ICD-10-CM | POA: Diagnosis not present

## 2023-09-25 DIAGNOSIS — R269 Unspecified abnormalities of gait and mobility: Secondary | ICD-10-CM | POA: Diagnosis not present

## 2023-09-25 DIAGNOSIS — M25651 Stiffness of right hip, not elsewhere classified: Secondary | ICD-10-CM | POA: Diagnosis not present

## 2023-09-25 DIAGNOSIS — M6281 Muscle weakness (generalized): Secondary | ICD-10-CM

## 2023-09-25 NOTE — Therapy (Signed)
 OUTPATIENT PHYSICAL THERAPY LOWER EXTREMITY TREATMENT  Patient Name: Duane Grant MRN: [id number] DOB:1955-12-25, 68 y.o., male Today's Date: 09/25/2023  END OF SESSION:  PT End of Session - 09/25/23 0726     Visit Number 11    Number of Visits 16    Date for PT Re-Evaluation 10/16/23    PT Start Time 0724    PT Stop Time 0830    PT Time Calculation (min) 66 min            Past Medical History:  Diagnosis Date   Arthritis    Cancer (HCC) 2009   Prostate, radiation but no surgery   Diabetes mellitus without complication (HCC)    Elevated PSA    H/O removal of cyst 12/02/2019   back    History of prostate cancer    Hyperlipidemia    Hypertension    Past Surgical History:  Procedure Laterality Date   COLONOSCOPY WITH PROPOFOL  N/A 01/16/2023   Procedure: COLONOSCOPY WITH PROPOFOL ;  Surgeon: Selena Daily, MD;  Location: [facility];  Service: Gastroenterology;  Laterality: N/A;   HERNIA REPAIR  2010   Umbilical   HERNIA REPAIR  2010   JOINT REPLACEMENT     Removal Fatty Tumor Right 2015   [facility], right side of chin/jaw   TOOTH EXTRACTION  12/15/2020   TOTAL HIP ARTHROPLASTY Left 06/09/2016   Procedure: TOTAL HIP ARTHROPLASTY;  Surgeon: Rande Bushy, MD;  Location: [facility] ORS;  Service: Orthopedics;  Laterality: Left;   Patient Active Problem List   Diagnosis Date Noted   Positive colorectal cancer screening using Cologuard test 01/16/2023   Cecal polyp 01/16/2023   Adenomatous polyp of colon 01/16/2023   Encounter for Department of Transportation (DOT) examination for trucking license 25/95/6387   Hearing loss, left 06/10/2020   Drug-induced myopathy 05/11/2020   Chronic bilateral low back pain without sciatica 12/23/2019   Hyperlipidemia associated with type 2 diabetes mellitus (HCC) 11/11/2019   Recurrent HSV (herpes simplex virus) 03/11/2019   Arthritis of carpometacarpal (CMC) joint of left thumb 03/11/2019   Obesity (BMI 30.0-34.9)  03/11/2019   Status post total hip replacement, left 06/09/2016   Hip pain 05/18/2016   Type 2 diabetes mellitus with other specified complication (HCC) 02/06/2014   Prostate cancer (HCC) 02/06/2014   Parotid mass 12/23/2013   Essential hypertension 12/06/2013   Herpesviral infection of urogenital system 12/06/2013   Erectile dysfunction 12/06/2013   Neck mass 12/06/2013    PCP: Raina Bunting, DO  REFERRING PROVIDER: Maud Sorenson, PA-C  REFERRING DIAG: Presence of unspecified artificial hip joint   THERAPY DIAG:  History of total hip replacement, right  Pain in right hip  Hip joint stiffness, right  Gait difficulty  Muscle weakness (generalized)  Rationale for Evaluation and Treatment: Rehabilitation  ONSET DATE: 08/10/23  SUBJECTIVE:   SUBJECTIVE STATEMENT: Pt. S/p R posterior hip replacement on 08/10/23.  Pt. Returns to MD on 08/22/23 for bandage removal/ reassessment.  Pt. Reports 7-8/10 R hip pain at rest and is hoping to get more pain meds at MD f/u tomorrow.  Pt. Reports 3/10 R hip pain at best with ice/ medications.  Pt. Reports no falls and is currently using RW to ambulate.  Pt. Currently sleeping in recliner chair.    PERTINENT HISTORY: See MD note.  No L hip issues.    PAIN:  Are you having pain? Yes: NPRS scale: 7-8/10 Pain location: R hip Pain description: aching Aggravating factors: walking/ movement Relieving  factors: ice/ medications  PRECAUTIONS: Anterior hip  RED FLAGS: None   WEIGHT BEARING RESTRICTIONS: No  FALLS:  Has patient fallen in last 6 months? 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    PLOF: Independent  PATIENT GOALS: Increase R hip ROM/ strength/ ambulate with no assistive device.   Decrease pain/ promote return to work.    NEXT MD VISIT: 08/22/23  OBJECTIVE:  Note: Objective measures were completed at Evaluation unless otherwise noted.  DIAGNOSTIC FINDINGS: See MD notes  PATIENT SURVEYS:  FOTO initial 36/ goal 65  COGNITION: Overall cognitive status: Within functional limits for tasks assessed   SENSATION: WFL  EDEMA:  Moderate R lower leg swelling.  (+) pitting edema in R lower leg/ ankle.  Educated pt. On compression stockings/ ankle pumps/ elevating.    MUSCLE LENGTH: Hamstrings: Right 62 deg (pain); Left 60 deg  POSTURE: rounded shoulders and forward head  PALPATION: R lower leg/ ankle pitting edema.  Unable to palpate R hip secondary to bandage  LOWER EXTREMITY ROM:  Active ROM Right eval Left eval  Hip flexion 65 deg. (AAROM) 101 deg  Hip extension    Hip abduction 30 deg. 55 deg.  Hip adduction    Hip internal rotation    Hip external rotation    Knee flexion 82 deg. (Pain) 125 deg.  Knee extension 6 deg. 0 deg.  Ankle dorsiflexion    Ankle plantarflexion    Ankle inversion    Ankle eversion     (Blank rows = not tested)  LOWER EXTREMITY MMT:  MMT Right eval Left eval  Hip flexion 2/5 4/5  Hip extension    Hip abduction 3/5 4/5  Hip adduction    Hip internal rotation    Hip external rotation    Knee flexion 4-/5 5/5  Knee extension 4/5 pain 5/5  Ankle dorsiflexion 4/5 4/5  Ankle plantarflexion    Ankle inversion    Ankle eversion     (Blank rows = not tested)  LOWER EXTREMITY SPECIAL TESTS:  NT  FUNCTIONAL TESTS:  5 times sit to stand: TBD 6 minute walk test: TBD  GAIT: Distance walked: in clinic Assistive device utilized: Walker - 2 wheeled Level of assistance: Modified independence Comments: Moderate R antalgic gait pattern with limited R hip flexion/ step length.  Pt. Reports increase R hip pain with increase activity.   STS: 26.33 sec.  No UE assist/ slight increase in R hip discomfort during 4-5 reps.     09/18/23: MMT Right Left  Hip flexion 4-/5 4/5  Hip extension 4-/5 3+/5  Hip abduction 4/5 4/5  Hip adduction    Hip internal rotation    Hip external rotation    Knee flexion 4/5 4/5  Knee extension 4/5 (sore) 5/5  Ankle dorsiflexion 5/5 5/5  Ankle plantarflexion    Ankle inversion    Ankle eversion     (Blank rows = not tested)  STS: 10.50 seconds.  No UE assist/ slight increase in R hip discomfort with initial stand.  TREATMENT DATE: 09/25/2023  Subjective:    Pt. Reports R hip stiffness/soreness this morning with NPS score of 5-6/10. Pt. States that the more active they are the less their pain/soreness bothers them. Pt. Discussed that they had a follow-up MD appointment 09/22/2023 and are on track to return to work on 11/03/2023. Pt. Notes that they feel more comfortable doing things independently at home and that they are able to walk with [facility]. Pt. Indicates that they are still struggling with pain-free hip flexion, specifically supine R SLR.  There.ex.:  Nu-Step 12 minutes at L5/seat 10.  B LE only.  No increase c/o pain.  Discussed MD appt. Yesterday.      Total gym squats wide, middle, and close foot positioning. L22 3x20. RPE 5/10 and NPS 4/10 (stretching discomfort with increased ROM)  Total gym heel-raises L22 3x20. RPE 3/10 and NPS 0/10.  Prone press-ups/ hip flexor stretches. 3x15.     There.act.:  Kneeling hip-flexor stretch 3x10 B. Cuing for posterior pelvic tilt.   Staircase single leg step-downs. Bodyweight 3x10 B. Cue for neutral pelvic position and gluteal muscle activation. RPE 4/10 and NPS 3/10.  FFE staircase lunges. 2 5# Dumbells B UE. 3x15 B on 2nd step/12" box. RPE 6/10 and NPS 4/10.  Manual Therapy:  Supine hamstring/ hip abduction/ adduction/ gentle IR/ER stretches with holds.  10x each.  Supine SLR hamstring stretches 2x10  contract-relax B. (Pt. Espressed general tightness in all directions, 0/10 NPS).    Discussed use of ice/heat for home use.        PATIENT EDUCATION:  Education details: Discussed HEP ([id number]), ice pack.  Person educated: Patient and Spouse Education method: Explanation, Demonstration, and Handouts Education comprehension: verbalized understanding and returned demonstration  HOME EXERCISE PROGRAM: Access Code: [email or web address] URL: [email or web address] Date: 08/21/2023 Prepared by: Hazeline Lister  Exercises - Standing March with Counter Support  - 2 x daily - 7 x weekly - 2 sets - 10 reps - Standing Hip Abduction with Unilateral Counter Support  - 2 x daily - 7 x weekly - 2 sets - 10 reps - Heel Raises with Counter Support  - 2 x daily - 7 x weekly - 2 sets - 10 reps - Supine Heel Slide  - 2 x daily - 7 x weekly - 2 sets - 10 reps - Supine Gluteal Sets  - 2 x daily - 7 x weekly - 2 sets - 10 reps - Supine Single Leg Ankle Pumps  - 2 x daily - 7 x weekly - 2 sets - 10 reps - Seated Knee Extension AROM  - 2 x daily - 7 x weekly - 2 sets - 10 reps.  ASSESSMENT:  CLINICAL IMPRESSION:  Today's treatment session focused on advancing work related therapeutic activities and increasing R hip ROM. Pt. Was challenged with single leg lunging and step-down exercises targeting gluteal muscle activation, but was able to improve quality of movement with verbal cuing for proper pelvic control. Pt. Showed improved reciprocal gait pattern without use of SPC but pt. Expressed feeling more confident ambulating with SPC within the community. PT Progressed AROM exercise to include prone press ups, child's pose, and kneeling lunges. Pt. Demonstrated increased exercise performance related to global endurance and weight training interventions performed during today's session. Pt. Will benefit from skilled PT services to increase R hip ROM/ strength to improve pain-free functional mobility/ return to  work.         OBJECTIVE IMPAIRMENTS: Abnormal gait, decreased activity tolerance, decreased balance,  decreased endurance, decreased mobility, difficulty walking, decreased ROM, decreased strength, increased edema, impaired flexibility, improper body mechanics, postural dysfunction, and pain.   ACTIVITY LIMITATIONS: carrying, lifting, bending, sitting, standing, squatting, stairs, transfers, and locomotion level  PARTICIPATION LIMITATIONS: cleaning, driving, community activity, occupation, and yard work  PERSONAL FACTORS: Fitness and Past/current experiences are also affecting patient's functional outcome.   REHAB POTENTIAL: Good  CLINICAL DECISION MAKING: Stable/uncomplicated  EVALUATION COMPLEXITY: Low   GOALS: Goals reviewed with patient? Yes  SHORT TERM GOALS: Target date: 09/18/23 Pt. Independent with HEP to increase R hip AROM to WNL as compared to L hip to improve transfers/ gait with no assistive device.  Baseline:  see above Goal status: Partially met  LONG TERM GOALS: Target date: 10/16/23 Pt. Will increase FOTO to 64 to improve pain-free functional mobility.   Baseline: initial 36 Goal status: INITIAL  2.  Pt. Will increase R hip strength to 4/5 MMT to improve ability to step into truck.   Baseline: see above Goal status: INITIAL  3.  Pt. Will ambulate community distances with normalized gait pattern and no increase c/o pain independently to promote return to work.   Baseline: Mod. I with RW/ moderate R antalgic gait.   Goal status: INITIAL  4.  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    Baseline: currently not able to work  Goal status: INITIAL  PLAN:  PT FREQUENCY: 2x/week  PT DURATION: 8 weeks  PLANNED INTERVENTIONS: 97110-Therapeutic exercises, 97530- Therapeutic activity, [id number]- Neuromuscular re-education, 97535- Self Care, 86578- Manual therapy, [phone]- Gait training, Patient/Family education, Balance training, Stair  training, Cryotherapy, and Moist heat  PLAN FOR NEXT SESSION:  Re-assess MMT of B LE.   Lendell Quarry, PT, DPT # [phone] Physical Therapist - [facility]  [place], SPT 09/25/2023, 8:34 AM

## 2023-09-27 ENCOUNTER — Ambulatory Visit: Payer: BC Managed Care – PPO | Admitting: Physical Therapy

## 2023-09-27 ENCOUNTER — Encounter: Payer: Self-pay | Admitting: Physical Therapy

## 2023-09-27 DIAGNOSIS — Z96641 Presence of right artificial hip joint: Secondary | ICD-10-CM

## 2023-09-27 DIAGNOSIS — R269 Unspecified abnormalities of gait and mobility: Secondary | ICD-10-CM

## 2023-09-27 DIAGNOSIS — M25651 Stiffness of right hip, not elsewhere classified: Secondary | ICD-10-CM | POA: Diagnosis not present

## 2023-09-27 DIAGNOSIS — M25551 Pain in right hip: Secondary | ICD-10-CM

## 2023-09-27 DIAGNOSIS — M6281 Muscle weakness (generalized): Secondary | ICD-10-CM | POA: Diagnosis not present

## 2023-09-27 NOTE — Therapy (Signed)
OUTPATIENT PHYSICAL THERAPY LOWER EXTREMITY TREATMENT  Patient Name: Duane Grant MRN: [id number] DOB:1955-08-31, 68 y.o., male Today's Date: 09/27/2023  END OF SESSION:  PT End of Session - 09/27/23 0806     Visit Number 12    Number of Visits 16    Date for PT Re-Evaluation 10/16/23    PT Start Time 0806    PT Stop Time 0900    PT Time Calculation (min) 54 min            Past Medical History:  Diagnosis Date   Arthritis    Cancer (HCC) 2009   Prostate, radiation but no surgery   Diabetes mellitus without complication (HCC)    Elevated PSA    H/O removal of cyst 12/02/2019   back    History of prostate cancer    Hyperlipidemia    Hypertension    Past Surgical History:  Procedure Laterality Date   COLONOSCOPY WITH PROPOFOL N/A 01/16/2023   Procedure: COLONOSCOPY WITH PROPOFOL;  Surgeon: Toney Reil, MD;  Location: [facility];  Service: Gastroenterology;  Laterality: N/A;   HERNIA REPAIR  2010   Umbilical   HERNIA REPAIR  2010   JOINT REPLACEMENT     Removal Fatty Tumor Right 2015   [facility], right side of chin/jaw   TOOTH EXTRACTION  12/15/2020   TOTAL HIP ARTHROPLASTY Left 06/09/2016   Procedure: TOTAL HIP ARTHROPLASTY;  Surgeon: Juanell Fairly, MD;  Location: [facility] ORS;  Service: Orthopedics;  Laterality: Left;   Patient Active Problem List   Diagnosis Date Noted   Positive colorectal cancer screening using Cologuard test 01/16/2023   Cecal polyp 01/16/2023   Adenomatous polyp of colon 01/16/2023   Encounter for Department of Transportation (DOT) examination for trucking license 19/14/7829   Hearing loss, left 06/10/2020   Drug-induced myopathy 05/11/2020   Chronic bilateral low back pain without sciatica 12/23/2019   Hyperlipidemia associated with type 2 diabetes mellitus (HCC) 11/11/2019   Recurrent HSV (herpes simplex virus) 03/11/2019   Arthritis of carpometacarpal (CMC) joint of left thumb 03/11/2019   Obesity (BMI 30.0-34.9)  03/11/2019   Status post total hip replacement, left 06/09/2016   Hip pain 05/18/2016   Type 2 diabetes mellitus with other specified complication (HCC) 02/06/2014   Prostate cancer (HCC) 02/06/2014   Parotid mass 12/23/2013   Essential hypertension 12/06/2013   Herpesviral infection of urogenital system 12/06/2013   Erectile dysfunction 12/06/2013   Neck mass 12/06/2013    PCP: Smitty Cords, DO  REFERRING PROVIDER: Altamese Cabal, PA-C  REFERRING DIAG: Presence of unspecified artificial hip joint   THERAPY DIAG:  History of total hip replacement, right  Pain in right hip  Hip joint stiffness, right  Gait difficulty  Muscle weakness (generalized)  Rationale for Evaluation and Treatment: Rehabilitation  ONSET DATE: 08/10/23  SUBJECTIVE:   SUBJECTIVE STATEMENT: Pt. S/p R posterior hip replacement on 08/10/23.  Pt. Returns to MD on 08/22/23 for bandage removal/ reassessment.  Pt. Reports 7-8/10 R hip pain at rest and is hoping to get more pain meds at MD f/u tomorrow.  Pt. Reports 3/10 R hip pain at best with ice/ medications.  Pt. Reports no falls and is currently using RW to ambulate.  Pt. Currently sleeping in recliner chair.    PERTINENT HISTORY: See MD note.  No L hip issues.    PAIN:  Are you having pain? Yes: NPRS scale: 7-8/10 Pain location: R hip Pain description: aching Aggravating factors: walking/ movement Relieving  factors: ice/ medications  PRECAUTIONS: Anterior hip  RED FLAGS: None   WEIGHT BEARING RESTRICTIONS: No  FALLS:  Has patient fallen in last 6 months? 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    PLOF: Independent  PATIENT GOALS: Increase R hip ROM/ strength/ ambulate with no assistive device.   Decrease pain/ promote return to work.    NEXT MD VISIT: 08/22/23  OBJECTIVE:  Note: Objective measures were completed at Evaluation unless otherwise noted.  DIAGNOSTIC FINDINGS: See MD notes  PATIENT SURVEYS:  FOTO initial 36/ goal 63  COGNITION: Overall cognitive status: Within functional limits for tasks assessed   SENSATION: WFL  EDEMA:  Moderate R lower leg swelling.  (+) pitting edema in R lower leg/ ankle.  Educated pt. On compression stockings/ ankle pumps/ elevating.    MUSCLE LENGTH: Hamstrings: Right 62 deg (pain); Left 60 deg  POSTURE: rounded shoulders and forward head  PALPATION: R lower leg/ ankle pitting edema.  Unable to palpate R hip secondary to bandage  LOWER EXTREMITY ROM:  Active ROM Right eval Left eval  Hip flexion 65 deg. (AAROM) 101 deg  Hip extension    Hip abduction 30 deg. 55 deg.  Hip adduction    Hip internal rotation    Hip external rotation    Knee flexion 82 deg. (Pain) 125 deg.  Knee extension 6 deg. 0 deg.  Ankle dorsiflexion    Ankle plantarflexion    Ankle inversion    Ankle eversion     (Blank rows = not tested)  LOWER EXTREMITY MMT:  MMT Right eval Left eval  Hip flexion 2/5 4/5  Hip extension    Hip abduction 3/5 4/5  Hip adduction    Hip internal rotation    Hip external rotation    Knee flexion 4-/5 5/5  Knee extension 4/5 pain 5/5  Ankle dorsiflexion 4/5 4/5  Ankle plantarflexion    Ankle inversion    Ankle eversion     (Blank rows = not tested)  LOWER EXTREMITY SPECIAL TESTS:  NT  FUNCTIONAL TESTS:  5 times sit to stand: TBD 6 minute walk test: TBD  GAIT: Distance walked: in clinic Assistive device utilized: Walker - 2 wheeled Level of assistance: Modified independence Comments: Moderate R antalgic gait pattern with limited R hip flexion/ step length.  Pt. Reports increase R hip pain with increase activity.   STS: 26.33 sec.  No UE assist/ slight increase in R hip discomfort during 4-5 reps.     09/18/23: MMT Right Left  Hip flexion 4-/5 4/5  Hip extension 4-/5 3+/5  Hip abduction 4/5 4/5  Hip adduction    Hip internal rotation    Hip external rotation    Knee flexion 4/5 4/5  Knee extension 4/5 (sore) 5/5  Ankle dorsiflexion 5/5 5/5  Ankle plantarflexion    Ankle inversion    Ankle eversion     (Blank rows = not tested)  STS: 10.50 seconds.  No UE assist/ slight increase in R hip discomfort with initial stand.  TREATMENT DATE: 09/27/2023  Subjective:    Pt. Reports R hip stiffness/soreness this morning with NPS score of 4/10. Pt. States that they are gradually feeling less R hip stiffness after waking up in the morning. Pt. Notes that they feel more comfortable doing things independently at home and that they are able to walk with [facility]. Pt. Indicates that they have been working on supine SLR everyday. Pt. Recognizes that they are able to walk up and down 19 steps three times before feeling any hip discomfort in the morning.  There.ex.:  Nu-Step 10 minutes at L5/seat 10.  B LE only.  No increase c/o pain.   Total gym squats wide, middle, and close foot positioning. L22 3x20. RPE 5/10 and NPS 4/10 (stretching discomfort with increased ROM)  Total gym heel-raises L22 3x20. RPE 3/10 and NPS 0/10.  Prone press-ups/ hip flexor stretches. 3x15.   TRX Squat 2x10 Bodyweight. RPE 6/10 and NPS 4/10.  TRX Lunge  2x10 Bodyweight.     There.act.:  Kneeling hip-flexor stretch 3x10 B. Cuing for posterior pelvic tilt.   Staircase single leg step-downs. Bodyweight 3x10 B. Cue for neutral pelvic position and gluteal muscle activation. RPE 4/10 and NPS 3/10.  FFE staircase lunges. 2 5# Dumbells B UE. 3x15 B on 2nd step/12" box. RPE 6/10 and NPS 4/10.  Manual Therapy:  Supine hamstring/ hip abduction/ adduction/ gentle IR/ER stretches with holds.  10x  each.  Supine SLR hamstring stretches 2x10 contract-relax B. (Pt. Espressed general tightness in all directions, 0/10 NPS).   L Proximal Hamstring 69 degrees R Proximal Hamstring 62 degrees  AROM Assessments:   L Hip Flexion 108 degrees R Hip Flexion 95 degrees  Discussed use of ice/heat for home use.        PATIENT EDUCATION:  Education details: Discussed HEP ([id number]), ice pack.  Person educated: Patient and Spouse Education method: Explanation, Demonstration, and Handouts Education comprehension: verbalized understanding and returned demonstration  HOME EXERCISE PROGRAM: Access Code: [email or web address] URL: [email or web address] Date: 08/21/2023 Prepared by: Dorene Grebe  Exercises - Standing March with Counter Support  - 2 x daily - 7 x weekly - 2 sets - 10 reps - Standing Hip Abduction with Unilateral Counter Support  - 2 x daily - 7 x weekly - 2 sets - 10 reps - Heel Raises with Counter Support  - 2 x daily - 7 x weekly - 2 sets - 10 reps - Supine Heel Slide  - 2 x daily - 7 x weekly - 2 sets - 10 reps - Supine Gluteal Sets  - 2 x daily - 7 x weekly - 2 sets - 10 reps - Supine Single Leg Ankle Pumps  - 2 x daily - 7 x weekly - 2 sets - 10 reps - Seated Knee Extension AROM  - 2 x daily - 7 x weekly - 2 sets - 10 reps.  ASSESSMENT:  CLINICAL IMPRESSION:  Today's treatment session focused on B LE strength and ROM interventions. Pt. Marked improvement with single leg-step downs and required less cuing for gluteal activation. Pt. Benefited from verbal cues and demonstration to improve movement quality. Pt. Expressed feeling more gluteal activation during today's exercises. PT Updated HEP stretching routine to include supine hip IR/ER and piriformis/low back stretches. PT Introduced two TRX squat/lunge exercises focusing on LE strength and motor coordination. Pt. Is highly compliant with all assigned tasks during treatment session and exhibited less fatigue/rest between  sets. Pt. Will benefit from skilled PT services to increase R  hip ROM/ strength to improve pain-free functional mobility/ return to work.       OBJECTIVE IMPAIRMENTS: Abnormal gait, decreased activity tolerance, decreased balance, decreased endurance, decreased mobility, difficulty walking, decreased ROM, decreased strength, increased edema, impaired flexibility, improper body mechanics, postural dysfunction, and pain.   ACTIVITY LIMITATIONS: carrying, lifting, bending, sitting, standing, squatting, stairs, transfers, and locomotion level  PARTICIPATION LIMITATIONS: cleaning, driving, community activity, occupation, and yard work  PERSONAL FACTORS: Fitness and Past/current experiences are also affecting patient's functional outcome.   REHAB POTENTIAL: Good  CLINICAL DECISION MAKING: Stable/uncomplicated  EVALUATION COMPLEXITY: Low   GOALS: Goals reviewed with patient? Yes  SHORT TERM GOALS: Target date: 09/18/23 Pt. Independent with HEP to increase R hip AROM to WNL as compared to L hip to improve transfers/ gait with no assistive device.  Baseline:  see above Goal status: Partially met  LONG TERM GOALS: Target date: 10/16/23 Pt. Will increase FOTO to 64 to improve pain-free functional mobility.   Baseline: initial 36 Goal status: INITIAL  2.  Pt. Will increase R hip strength to 4/5 MMT to improve ability to step into truck.   Baseline: see above Goal status: INITIAL  3.  Pt. Will ambulate community distances with normalized gait pattern and no increase c/o pain independently to promote return to work.   Baseline: Mod. I with RW/ moderate R antalgic gait.   Goal status: INITIAL  4.  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    Baseline: currently not able to work  Goal status: INITIAL  PLAN:  PT FREQUENCY: 2x/week  PT DURATION: 8 weeks  PLANNED INTERVENTIONS: 97110-Therapeutic exercises, 97530- Therapeutic activity, [id number]- Neuromuscular  re-education, 97535- Self Care, 16109- Manual therapy, [phone]- Gait training, Patient/Family education, Balance training, Stair training, Cryotherapy, and Moist heat  PLAN FOR NEXT SESSION:  Re-assess MMT of B LE.   Cammie Mcgee, PT, DPT # [phone] Physical Therapist - [facility]  [place], SPT 09/27/2023, 9:00 AM

## 2023-10-03 ENCOUNTER — Ambulatory Visit: Payer: BC Managed Care – PPO | Admitting: Physical Therapy

## 2023-10-03 ENCOUNTER — Telehealth: Payer: Self-pay

## 2023-10-03 DIAGNOSIS — E1169 Type 2 diabetes mellitus with other specified complication: Secondary | ICD-10-CM

## 2023-10-03 MED ORDER — OZEMPIC (1 MG/DOSE) 4 MG/3ML ~~LOC~~ SOPN: 1.0000 mg | PEN_INJECTOR | SUBCUTANEOUS | 3 refills | Status: AC

## 2023-10-03 NOTE — Telephone Encounter (Signed)
Refilled  Saralyn Pilar, DO [facility] 10/03/2023, 10:41 AM

## 2023-10-04 ENCOUNTER — Ambulatory Visit: Payer: BC Managed Care – PPO | Admitting: Physical Therapy

## 2023-10-04 ENCOUNTER — Encounter: Payer: Self-pay | Admitting: Physical Therapy

## 2023-10-04 DIAGNOSIS — M25651 Stiffness of right hip, not elsewhere classified: Secondary | ICD-10-CM

## 2023-10-04 DIAGNOSIS — M6281 Muscle weakness (generalized): Secondary | ICD-10-CM

## 2023-10-04 DIAGNOSIS — R269 Unspecified abnormalities of gait and mobility: Secondary | ICD-10-CM | POA: Diagnosis not present

## 2023-10-04 DIAGNOSIS — M25551 Pain in right hip: Secondary | ICD-10-CM | POA: Diagnosis not present

## 2023-10-04 DIAGNOSIS — Z96641 Presence of right artificial hip joint: Secondary | ICD-10-CM

## 2023-10-04 NOTE — Therapy (Signed)
OUTPATIENT PHYSICAL THERAPY LOWER EXTREMITY TREATMENT  Patient Name: Duane Grant MRN: [id number] DOB:06-25-1956, 68 y.o., male Today's Date: 10/04/2023  END OF SESSION:  PT End of Session - 10/04/23 0723     Visit Number 13    Number of Visits 16    Date for PT Re-Evaluation 10/16/23    PT Start Time 0715    PT Stop Time 0818    PT Time Calculation (min) 63 min    Behavior During Therapy [facility] for tasks assessed/performed            Past Medical History:  Diagnosis Date   Arthritis    Cancer (HCC) 2009   Prostate, radiation but no surgery   Diabetes mellitus without complication (HCC)    Elevated PSA    H/O removal of cyst 12/02/2019   back    History of prostate cancer    Hyperlipidemia    Hypertension    Past Surgical History:  Procedure Laterality Date   COLONOSCOPY WITH PROPOFOL N/A 01/16/2023   Procedure: COLONOSCOPY WITH PROPOFOL;  Surgeon: Toney Reil, MD;  Location: [facility];  Service: Gastroenterology;  Laterality: N/A;   HERNIA REPAIR  2010   Umbilical   HERNIA REPAIR  2010   JOINT REPLACEMENT     Removal Fatty Tumor Right 2015   [facility], right side of chin/jaw   TOOTH EXTRACTION  12/15/2020   TOTAL HIP ARTHROPLASTY Left 06/09/2016   Procedure: TOTAL HIP ARTHROPLASTY;  Surgeon: Juanell Fairly, MD;  Location: [facility] ORS;  Service: Orthopedics;  Laterality: Left;   Patient Active Problem List   Diagnosis Date Noted   Positive colorectal cancer screening using Cologuard test 01/16/2023   Cecal polyp 01/16/2023   Adenomatous polyp of colon 01/16/2023   Encounter for Department of Transportation (DOT) examination for trucking license 40/98/1191   Hearing loss, left 06/10/2020   Drug-induced myopathy 05/11/2020   Chronic bilateral low back pain without sciatica 12/23/2019   Hyperlipidemia associated with type 2 diabetes mellitus (HCC) 11/11/2019   Recurrent HSV (herpes simplex virus) 03/11/2019   Arthritis of carpometacarpal (CMC)  joint of left thumb 03/11/2019   Obesity (BMI 30.0-34.9) 03/11/2019   Status post total hip replacement, left 06/09/2016   Hip pain 05/18/2016   Type 2 diabetes mellitus with other specified complication (HCC) 02/06/2014   Prostate cancer (HCC) 02/06/2014   Parotid mass 12/23/2013   Essential hypertension 12/06/2013   Herpesviral infection of urogenital system 12/06/2013   Erectile dysfunction 12/06/2013   Neck mass 12/06/2013    PCP: Smitty Cords, DO  REFERRING PROVIDER: Altamese Cabal, PA-C  REFERRING DIAG: Presence of unspecified artificial hip joint   THERAPY DIAG:  History of total hip replacement, right  Pain in right hip  Hip joint stiffness, right  Gait difficulty  Muscle weakness (generalized)  Rationale for Evaluation and Treatment: Rehabilitation  ONSET DATE: 08/10/23  SUBJECTIVE:   SUBJECTIVE STATEMENT: Pt. S/p R posterior hip replacement on 08/10/23.  Pt. Returns to MD on 08/22/23 for bandage removal/ reassessment.  Pt. Reports 7-8/10 R hip pain at rest and is hoping to get more pain meds at MD f/u tomorrow.  Pt. Reports 3/10 R hip pain at best with ice/ medications.  Pt. Reports no falls and is currently using RW to ambulate.  Pt. Currently sleeping in recliner chair.    PERTINENT HISTORY: See MD note.  No L hip issues.    PAIN:  Are you having pain? Yes: NPRS scale: 7-8/10 Pain location:  R hip Pain description: aching Aggravating factors: walking/ movement Relieving factors: ice/ medications  PRECAUTIONS: Anterior hip  RED FLAGS: None   WEIGHT BEARING RESTRICTIONS: No  FALLS:  Has patient fallen in last 6 months? 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    PLOF: Independent  PATIENT GOALS: Increase R  hip ROM/ strength/ ambulate with no assistive device.  Decrease pain/ promote return to work.    NEXT MD VISIT: 08/22/23  OBJECTIVE:  Note: Objective measures were completed at Evaluation unless otherwise noted.  DIAGNOSTIC FINDINGS: See MD notes  PATIENT SURVEYS:  FOTO initial 36/ goal 38  COGNITION: Overall cognitive status: Within functional limits for tasks assessed   SENSATION: WFL  EDEMA:  Moderate R lower leg swelling.  (+) pitting edema in R lower leg/ ankle.  Educated pt. On compression stockings/ ankle pumps/ elevating.    MUSCLE LENGTH: Hamstrings: Right 62 deg (pain); Left 60 deg  POSTURE: rounded shoulders and forward head  PALPATION: R lower leg/ ankle pitting edema.  Unable to palpate R hip secondary to bandage  LOWER EXTREMITY ROM:  Active ROM Right eval Left eval  Hip flexion 65 deg. (AAROM) 101 deg  Hip extension    Hip abduction 30 deg. 55 deg.  Hip adduction    Hip internal rotation    Hip external rotation    Knee flexion 82 deg. (Pain) 125 deg.  Knee extension 6 deg. 0 deg.  Ankle dorsiflexion    Ankle plantarflexion    Ankle inversion    Ankle eversion     (Blank rows = not tested)  LOWER EXTREMITY MMT:  MMT Right eval Left eval  Hip flexion 2/5 4/5  Hip extension    Hip abduction 3/5 4/5  Hip adduction    Hip internal rotation    Hip external rotation    Knee flexion 4-/5 5/5  Knee extension 4/5 pain 5/5  Ankle dorsiflexion 4/5 4/5  Ankle plantarflexion    Ankle inversion    Ankle eversion     (Blank rows = not tested)  LOWER EXTREMITY SPECIAL TESTS:  NT  FUNCTIONAL TESTS:  5 times sit to stand: TBD 6 minute walk test: TBD  GAIT: Distance walked: in clinic Assistive device utilized: Walker - 2 wheeled Level of assistance: Modified independence Comments: Moderate R antalgic gait pattern with limited R hip flexion/ step length.  Pt. Reports increase R hip pain with increase activity.   STS: 26.33 sec.  No UE assist/  slight increase in R hip discomfort during 4-5 reps.    09/18/23: MMT Right Left  Hip flexion 4-/5 4/5  Hip extension 4-/5 3+/5  Hip abduction 4/5 4/5  Hip adduction    Hip internal rotation    Hip external rotation    Knee flexion 4/5 4/5  Knee extension 4/5 (sore) 5/5  Ankle dorsiflexion 5/5 5/5  Ankle plantarflexion    Ankle inversion    Ankle eversion     (Blank rows = not tested)  STS: 10.50 seconds.  No UE assist/ slight increase in R hip discomfort with initial stand.  TREATMENT DATE: 10/04/2023  Subjective:    Pt. Arrives to treatment session prepared and early. Pt. Reports R hip stiffness/soreness this morning with NPS score of 3/10. Pt. States that they are gradually feeling less R hip stiffness after waking up in the morning. Pt. Notes that they rarely use their [facility] for community ambulation. Pt. Indicates that they have been working on supine SLR everyday in addition to stair step lunging/stretching. Pt. Indicates that they are going to start using their stationary bike at home for extra cardiovascular training each day as a supplement to walking.  There.ex.:  Nu-Step 15 minutes at L5/seat 10.  B LE only.  No increase c/o pain.   Total gym squats wide, middle, and close foot positioning. L22 3x20. RPE 5/10 and NPS 4/10 (stretching discomfort with increased ROM)  Total gym heel-raises L22 3x20. RPE 3/10 and NPS 0/10.   Prone press-ups/ hip flexor stretches. 3x15.   TRX Squat 2x12 Bodyweight. RPE 6/10 and NPS 4/10.  TRX Lunge  2x12 Bodyweight.   TRX Lateral Lunge 2x12 Bodyweight.     There.act.:  Kneeling hip-flexor stretch 3x10 B. Cuing for posterior pelvic tilt.   Staircase single leg step-downs. Bodyweight 3x10 B. Cue for neutral pelvic position and gluteal muscle activation. RPE 4/10 and NPS 3/10.  FFE staircase lunges. 2 5# Dumbells B  UE. 3x15 B on 2nd step/12" box. RPE 6/10 and NPS 4/10.  = 1640 ft.   Manual Therapy:  Supine hamstring/ hip abduction/ adduction/ gentle IR/ER stretches with holds.  10x each.  Supine SLR hamstring stretches 2x10 contract-relax B. (Pt. Espressed general tightness in all directions, 0/10 NPS).     Discussed use of ice/heat for home use.        PATIENT EDUCATION:  Education details: Discussed HEP ([id number]), ice pack.  Person educated: Patient and Spouse Education method: Explanation, Demonstration, and Handouts Education comprehension: verbalized understanding and returned demonstration  HOME EXERCISE PROGRAM: Access Code: [email or web address] URL: [email or web address] Date: 08/21/2023 Prepared by: Dorene Grebe  Exercises - Standing March with Counter Support  - 2 x daily - 7 x weekly - 2 sets - 10 reps - Standing Hip Abduction with Unilateral Counter Support  - 2 x daily - 7 x weekly - 2 sets - 10 reps - Heel Raises with Counter Support  - 2 x daily - 7 x weekly - 2 sets - 10 reps - Supine Heel Slide  - 2 x daily - 7 x weekly - 2 sets - 10 reps - Supine Gluteal Sets  - 2 x daily - 7 x weekly - 2 sets - 10 reps - Supine Single Leg Ankle Pumps  - 2 x daily - 7 x weekly - 2 sets - 10 reps - Seated Knee Extension AROM  - 2 x daily - 7 x weekly - 2 sets - 10 reps.  ASSESSMENT:  CLINICAL IMPRESSION:  Today's session focused on B LE strength, specifically R hip flexion. Pt. Demonstrated multiple improvements including increasing their resisted walking weight to 110# in all directions, to 1640 ft., and repetition volume for all LE exercises. Pt. Is progressing to only using [facility] for caution when ambulating in busy environments. Pt. Expressed general soreness but no pain during today's exercises. PT Advanced TRX exercises to include lateral lunging. Pt. Is highly compliant with all assigned tasks during treatment session and exhibited less fatigue/rest between sets. Pt. Will  benefit from skilled PT services to increase R hip ROM/ strength to improve pain-free functional  mobility/ return to work.       OBJECTIVE IMPAIRMENTS: Abnormal gait, decreased activity tolerance, decreased balance, decreased endurance, decreased mobility, difficulty walking, decreased ROM, decreased strength, increased edema, impaired flexibility, improper body mechanics, postural dysfunction, and pain.   ACTIVITY LIMITATIONS: carrying, lifting, bending, sitting, standing, squatting, stairs, transfers, and locomotion level  PARTICIPATION LIMITATIONS: cleaning, driving, community activity, occupation, and yard work  PERSONAL FACTORS: Fitness and Past/current experiences are also affecting patient's functional outcome.   REHAB POTENTIAL: Good  CLINICAL DECISION MAKING: Stable/uncomplicated  EVALUATION COMPLEXITY: Low   GOALS: Goals reviewed with patient? Yes  SHORT TERM GOALS: Target date: 09/18/23 Pt. Independent with HEP to increase R hip AROM to WNL as compared to L hip to improve transfers/ gait with no assistive device.  Baseline:  see above Goal status: Partially met  LONG TERM GOALS: Target date: 10/16/23 Pt. Will increase FOTO to 64 to improve pain-free functional mobility.   Baseline: initial 36 Goal status: INITIAL  2.  Pt. Will increase R hip strength to 4/5 MMT to improve ability to step into truck.   Baseline: see above Goal status: INITIAL  3.  Pt. Will ambulate community distances with normalized gait pattern and no increase c/o pain independently to promote return to work.   Baseline: Mod. I with RW/ moderate R antalgic gait.   Goal status: INITIAL  4.  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    Baseline: currently not able to work  Goal status: INITIAL  PLAN:  PT FREQUENCY: 2x/week  PT DURATION: 8 weeks  PLANNED INTERVENTIONS: 97110-Therapeutic exercises, 97530- Therapeutic activity, [id number]- Neuromuscular re-education,  97535- Self Care, 16109- Manual therapy, [phone]- Gait training, Patient/Family education, Balance training, Stair training, Cryotherapy, and Moist heat  PLAN FOR NEXT SESSION:  Test supine SLR 10 reps    Cammie Mcgee, PT, DPT # [phone] Physical Therapist - [facility]  [place], SPT 10/04/2023, 9:33 AM

## 2023-10-06 ENCOUNTER — Ambulatory Visit: Payer: BC Managed Care – PPO | Admitting: Physical Therapy

## 2023-10-06 ENCOUNTER — Encounter: Payer: Self-pay | Admitting: Physical Therapy

## 2023-10-06 DIAGNOSIS — Z96641 Presence of right artificial hip joint: Secondary | ICD-10-CM | POA: Diagnosis not present

## 2023-10-06 DIAGNOSIS — M25551 Pain in right hip: Secondary | ICD-10-CM | POA: Diagnosis not present

## 2023-10-06 DIAGNOSIS — M25651 Stiffness of right hip, not elsewhere classified: Secondary | ICD-10-CM | POA: Diagnosis not present

## 2023-10-06 DIAGNOSIS — R269 Unspecified abnormalities of gait and mobility: Secondary | ICD-10-CM

## 2023-10-06 DIAGNOSIS — M6281 Muscle weakness (generalized): Secondary | ICD-10-CM | POA: Diagnosis not present

## 2023-10-06 NOTE — Therapy (Addendum)
OUTPATIENT PHYSICAL THERAPY LOWER EXTREMITY TREATMENT  Patient Name: Duane Grant MRN: [id number] DOB:07-08-1956, 68 y.o., male Today's Date: 10/06/2023  END OF SESSION:  PT End of Session - 10/06/23 0715     Visit Number 14    Number of Visits 16    Date for PT Re-Evaluation 10/16/23    PT Start Time 0708    PT Stop Time 0816    PT Time Calculation (min) 68 min    Behavior During Therapy [facility] for tasks assessed/performed            Past Medical History:  Diagnosis Date   Arthritis    Cancer (HCC) 2009   Prostate, radiation but no surgery   Diabetes mellitus without complication (HCC)    Elevated PSA    H/O removal of cyst 12/02/2019   back    History of prostate cancer    Hyperlipidemia    Hypertension    Past Surgical History:  Procedure Laterality Date   COLONOSCOPY WITH PROPOFOL N/A 01/16/2023   Procedure: COLONOSCOPY WITH PROPOFOL;  Surgeon: Toney Reil, MD;  Location: [facility] ENDOSCOPY;  Service: Gastroenterology;  Laterality: N/A;   HERNIA REPAIR  2010   Umbilical   HERNIA REPAIR  2010   JOINT REPLACEMENT     Removal Fatty Tumor Right 2015   [facility], right side of chin/jaw   TOOTH EXTRACTION  12/15/2020   TOTAL HIP ARTHROPLASTY Left 06/09/2016   Procedure: TOTAL HIP ARTHROPLASTY;  Surgeon: Juanell Fairly, MD;  Location: [facility] ORS;  Service: Orthopedics;  Laterality: Left;   Patient Active Problem List   Diagnosis Date Noted   Positive colorectal cancer screening using Cologuard test 01/16/2023   Cecal polyp 01/16/2023   Adenomatous polyp of colon 01/16/2023   Encounter for Department of Transportation (DOT) examination for trucking license 47/82/9562   Hearing loss, left 06/10/2020   Drug-induced myopathy 05/11/2020   Chronic bilateral low back pain without sciatica 12/23/2019   Hyperlipidemia associated with type 2 diabetes mellitus (HCC) 11/11/2019   Recurrent HSV (herpes simplex virus) 03/11/2019   Arthritis of carpometacarpal (CMC)  joint of left thumb 03/11/2019   Obesity (BMI 30.0-34.9) 03/11/2019   Status post total hip replacement, left 06/09/2016   Hip pain 05/18/2016   Type 2 diabetes mellitus with other specified complication (HCC) 02/06/2014   Prostate cancer (HCC) 02/06/2014   Parotid mass 12/23/2013   Essential hypertension 12/06/2013   Herpesviral infection of urogenital system 12/06/2013   Erectile dysfunction 12/06/2013   Neck mass 12/06/2013    PCP: Smitty Cords, DO  REFERRING PROVIDER: Altamese Cabal, PA-C  REFERRING DIAG: Presence of unspecified artificial hip joint   THERAPY DIAG:  History of total hip replacement, right  Pain in right hip  Hip joint stiffness, right  Gait difficulty  Muscle weakness (generalized)  Rationale for Evaluation and Treatment: Rehabilitation  ONSET DATE: 08/10/23  SUBJECTIVE:   SUBJECTIVE STATEMENT: Pt. S/p R posterior hip replacement on 08/10/23.  Pt. Returns to MD on 08/22/23 for bandage removal/ reassessment.  Pt. Reports 7-8/10 R hip pain at rest and is hoping to get more pain meds at MD f/u tomorrow.  Pt. Reports 3/10 R hip pain at best with ice/ medications.  Pt. Reports no falls and is currently using RW to ambulate.  Pt. Currently sleeping in recliner chair.    PERTINENT HISTORY: See MD note.  No L hip issues.    PAIN:  Are you having pain? Yes: NPRS scale: 7-8/10 Pain location:  R hip Pain description: aching Aggravating factors: walking/ movement Relieving factors: ice/ medications  PRECAUTIONS: Anterior hip  RED FLAGS: None   WEIGHT BEARING RESTRICTIONS: No  FALLS:  Has patient fallen in last 6 months? 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    PLOF: Independent  PATIENT GOALS: Increase R  hip ROM/ strength/ ambulate with no assistive device.  Decrease pain/ promote return to work.    NEXT MD VISIT: 08/22/23  OBJECTIVE:  Note: Objective measures were completed at Evaluation unless otherwise noted.  DIAGNOSTIC FINDINGS: See MD notes  PATIENT SURVEYS:  FOTO initial 36/ goal 75  COGNITION: Overall cognitive status: Within functional limits for tasks assessed   SENSATION: WFL  EDEMA:  Moderate R lower leg swelling.  (+) pitting edema in R lower leg/ ankle.  Educated pt. On compression stockings/ ankle pumps/ elevating.    MUSCLE LENGTH: Hamstrings: Right 62 deg (pain); Left 60 deg  POSTURE: rounded shoulders and forward head  PALPATION: R lower leg/ ankle pitting edema.  Unable to palpate R hip secondary to bandage  LOWER EXTREMITY ROM:  Active ROM Right eval Left eval  Hip flexion 65 deg. (AAROM) 101 deg  Hip extension    Hip abduction 30 deg. 55 deg.  Hip adduction    Hip internal rotation    Hip external rotation    Knee flexion 82 deg. (Pain) 125 deg.  Knee extension 6 deg. 0 deg.  Ankle dorsiflexion    Ankle plantarflexion    Ankle inversion    Ankle eversion     (Blank rows = not tested)  LOWER EXTREMITY MMT:  MMT Right eval Left eval  Hip flexion 2/5 4/5  Hip extension    Hip abduction 3/5 4/5  Hip adduction    Hip internal rotation    Hip external rotation    Knee flexion 4-/5 5/5  Knee extension 4/5 pain 5/5  Ankle dorsiflexion 4/5 4/5  Ankle plantarflexion    Ankle inversion    Ankle eversion     (Blank rows = not tested)  LOWER EXTREMITY SPECIAL TESTS:  NT  FUNCTIONAL TESTS:  5 times sit to stand: TBD 6 minute walk test: TBD  GAIT: Distance walked: in clinic Assistive device utilized: Walker - 2 wheeled Level of assistance: Modified independence Comments: Moderate R antalgic gait pattern with limited R hip flexion/ step length.  Pt. Reports increase R hip pain with increase activity.   STS: 26.33 sec.  No UE assist/  slight increase in R hip discomfort during 4-5 reps.    09/18/23: MMT Right Left  Hip flexion 4-/5 4/5  Hip extension 4-/5 3+/5  Hip abduction 4/5 4/5  Hip adduction    Hip internal rotation    Hip external rotation    Knee flexion 4/5 4/5  Knee extension 4/5 (sore) 5/5  Ankle dorsiflexion 5/5 5/5  Ankle plantarflexion    Ankle inversion    Ankle eversion     (Blank rows = not tested)  STS: 10.50 seconds.  No UE assist/ slight increase in R hip discomfort with initial stand.   = 1640 ft.  TREATMENT DATE: 10/06/2023  Subjective:    Pt. Arrives to treatment session prepared and early. Pt. Reports R hip stiffness/soreness this morning with NPS score of 3/10. Pt. Was really sore yesterday and states he may have overdid it with exercise/ walking around [place] on Wednesday after PT.    There.ex.:  Scifit L6 10 minutes B LE only.  No increase c/o pain.  Discussed recent increase in R hip soreness.    Walking around PT clinic with no SPC working on consistent gait pattern/ heel strike/ arm swing.    Supine R marching/ SLR 10x each.  Supine R hip flexion 109 deg. AROM/ 111 deg. PROM.  L hip flexion 113 deg.    Nautilus: 125# resisted gait all 4-planes 7x.    Supine generalized R hip/hamstring stretches (5 min.).    Total gym squats wide, middle, and close foot positioning. L22 3x20. RPE 5/10 and NPS 4/10 (stretching discomfort with increased ROM)  Total gym heel-raises L22 3x20. RPE 3/10 and NPS 0/10.   There.act.:  12" step ups at stairs with 10# blue ball.    TRX Squat 3x10 Bodyweight. RPE 6/10 and NPS 4/10.  Alternating with 2nd step lunges at stairs.    TRX Lunge  3x10 Bodyweight.   TRX Lateral Lunge 3x10 Bodyweight.   PATIENT EDUCATION:  Education details: Discussed HEP ([id number]), ice pack.  Person educated: Patient and  Spouse Education method: Explanation, Demonstration, and Handouts Education comprehension: verbalized understanding and returned demonstration  HOME EXERCISE PROGRAM: Access Code: [email or web address] URL: [email or web address] Date: 08/21/2023 Prepared by: Dorene Grebe  Exercises - Standing March with Counter Support  - 2 x daily - 7 x weekly - 2 sets - 10 reps - Standing Hip Abduction with Unilateral Counter Support  - 2 x daily - 7 x weekly - 2 sets - 10 reps - Heel Raises with Counter Support  - 2 x daily - 7 x weekly - 2 sets - 10 reps - Supine Heel Slide  - 2 x daily - 7 x weekly - 2 sets - 10 reps - Supine Gluteal Sets  - 2 x daily - 7 x weekly - 2 sets - 10 reps - Supine Single Leg Ankle Pumps  - 2 x daily - 7 x weekly - 2 sets - 10 reps - Seated Knee Extension AROM  - 2 x daily - 7 x weekly - 2 sets - 10 reps.  ASSESSMENT:  CLINICAL IMPRESSION:  Today's session focused on B LE strength, specifically R hip flexion. Pt. Is progressing well to more normalized gait patterns and ability to step up on 12" step while carrying wt.  Pt. Expressed general soreness but no pain during today's exercises. Marked increase in R hip flexion AROM and hamstring length during supine stretches today.  Pt. Is highly compliant with all assigned tasks during treatment session and exhibited less fatigue/rest between sets. Pt. Will benefit from skilled PT services to increase R hip ROM/ strength to improve pain-free functional mobility/ return to work.       OBJECTIVE IMPAIRMENTS: Abnormal gait, decreased activity tolerance, decreased balance, decreased endurance, decreased mobility, difficulty walking, decreased ROM, decreased strength, increased edema, impaired flexibility, improper body mechanics, postural dysfunction, and pain.   ACTIVITY LIMITATIONS: carrying, lifting, bending, sitting, standing, squatting, stairs, transfers, and locomotion level  PARTICIPATION LIMITATIONS: cleaning, driving,  community activity, occupation, and yard work  PERSONAL FACTORS: Fitness and Past/current experiences are also affecting patient's functional outcome.   REHAB POTENTIAL: Good  CLINICAL DECISION  MAKING: Stable/uncomplicated  EVALUATION COMPLEXITY: Low   GOALS: Goals reviewed with patient? Yes  SHORT TERM GOALS: Target date: 09/18/23 Pt. Independent with HEP to increase R hip AROM to WNL as compared to L hip to improve transfers/ gait with no assistive device.  Baseline:  see above Goal status: Partially met  LONG TERM GOALS: Target date: 10/16/23 Pt. Will increase FOTO to 64 to improve pain-free functional mobility.   Baseline: initial 36 Goal status: INITIAL  2.  Pt. Will increase R hip strength to 4/5 MMT to improve ability to step into truck.   Baseline: see above Goal status: INITIAL  3.  Pt. Will ambulate community distances with normalized gait pattern and no increase c/o pain independently to promote return to work.   Baseline: Mod. I with RW/ moderate R antalgic gait.   Goal status: INITIAL  4.  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    Baseline: currently not able to work  Goal status: INITIAL  PLAN:  PT FREQUENCY: 2x/week  PT DURATION: 8 weeks  PLANNED INTERVENTIONS: 97110-Therapeutic exercises, 97530- Therapeutic activity, [id number]- Neuromuscular re-education, 97535- Self Care, 91478- Manual therapy, [phone]- Gait training, Patient/Family education, Balance training, Stair training, Cryotherapy, and Moist heat  PLAN FOR NEXT SESSION:  Check FOTO or LEFS next tx.      Cammie Mcgee, PT, DPT # [phone] Physical Therapist - [facility]  [place], [place] 10/06/2023, 8:22 AM

## 2023-10-09 ENCOUNTER — Encounter: Payer: Self-pay | Admitting: Physical Therapy

## 2023-10-09 ENCOUNTER — Ambulatory Visit: Payer: BC Managed Care – PPO | Admitting: Physical Therapy

## 2023-10-09 DIAGNOSIS — Z96641 Presence of right artificial hip joint: Secondary | ICD-10-CM

## 2023-10-09 DIAGNOSIS — M6281 Muscle weakness (generalized): Secondary | ICD-10-CM

## 2023-10-09 DIAGNOSIS — R269 Unspecified abnormalities of gait and mobility: Secondary | ICD-10-CM

## 2023-10-09 DIAGNOSIS — M25551 Pain in right hip: Secondary | ICD-10-CM

## 2023-10-09 DIAGNOSIS — M25651 Stiffness of right hip, not elsewhere classified: Secondary | ICD-10-CM

## 2023-10-09 NOTE — Therapy (Signed)
 OUTPATIENT PHYSICAL THERAPY LOWER EXTREMITY TREATMENT  Patient Name: Duane Grant MRN: [id number] DOB:02-Jun-1956, 68 y.o., male Today's Date: 10/09/2023  END OF SESSION:  PT End of Session - 10/09/23 0719     Visit Number 15    Number of Visits 16    Date for PT Re-Evaluation 10/16/23    PT Start Time 0710    PT Stop Time 0817    PT Time Calculation (min) 67 min    Behavior During Therapy [facility] for tasks assessed/performed            Past Medical History:  Diagnosis Date   Arthritis    Cancer (HCC) 2009   Prostate, radiation but no surgery   Diabetes mellitus without complication (HCC)    Elevated PSA    H/O removal of cyst 12/02/2019   back    History of prostate cancer    Hyperlipidemia    Hypertension    Past Surgical History:  Procedure Laterality Date   COLONOSCOPY WITH PROPOFOL N/A 01/16/2023   Procedure: COLONOSCOPY WITH PROPOFOL;  Surgeon: Toney Reil, MD;  Location: [facility] ENDOSCOPY;  Service: Gastroenterology;  Laterality: N/A;   HERNIA REPAIR  2010   Umbilical   HERNIA REPAIR  2010   JOINT REPLACEMENT     Removal Fatty Tumor Right 2015   [facility], right side of chin/jaw   TOOTH EXTRACTION  12/15/2020   TOTAL HIP ARTHROPLASTY Left 06/09/2016   Procedure: TOTAL HIP ARTHROPLASTY;  Surgeon: Juanell Fairly, MD;  Location: [facility] ORS;  Service: Orthopedics;  Laterality: Left;   Patient Active Problem List   Diagnosis Date Noted   Positive colorectal cancer screening using Cologuard test 01/16/2023   Cecal polyp 01/16/2023   Adenomatous polyp of colon 01/16/2023   Encounter for Department of Transportation (DOT) examination for trucking license 40/98/1191   Hearing loss, left 06/10/2020   Drug-induced myopathy 05/11/2020   Chronic bilateral low back pain without sciatica 12/23/2019   Hyperlipidemia associated with type 2 diabetes mellitus (HCC) 11/11/2019   Recurrent HSV (herpes simplex virus) 03/11/2019   Arthritis of carpometacarpal (CMC)  joint of left thumb 03/11/2019   Obesity (BMI 30.0-34.9) 03/11/2019   Status post total hip replacement, left 06/09/2016   Hip pain 05/18/2016   Type 2 diabetes mellitus with other specified complication (HCC) 02/06/2014   Prostate cancer (HCC) 02/06/2014   Parotid mass 12/23/2013   Essential hypertension 12/06/2013   Herpesviral infection of urogenital system 12/06/2013   Erectile dysfunction 12/06/2013   Neck mass 12/06/2013    PCP: Smitty Cords, DO  REFERRING PROVIDER: Altamese Cabal, PA-C  REFERRING DIAG: Presence of unspecified artificial hip joint   THERAPY DIAG:  History of total hip replacement, right  Pain in right hip  Hip joint stiffness, right  Gait difficulty  Muscle weakness (generalized)  Rationale for Evaluation and Treatment: Rehabilitation  ONSET DATE: 08/10/23  SUBJECTIVE:   SUBJECTIVE STATEMENT: Pt. S/p R posterior hip replacement on 08/10/23.  Pt. Returns to MD on 08/22/23 for bandage removal/ reassessment.  Pt. Reports 7-8/10 R hip pain at rest and is hoping to get more pain meds at MD f/u tomorrow.  Pt. Reports 3/10 R hip pain at best with ice/ medications.  Pt. Reports no falls and is currently using RW to ambulate.  Pt. Currently sleeping in recliner chair.    PERTINENT HISTORY: See MD note.  No L hip issues.    PAIN:  Are you having pain? Yes: NPRS scale: 7-8/10 Pain location:  R hip Pain description: aching Aggravating factors: walking/ movement Relieving factors: ice/ medications  PRECAUTIONS: Anterior hip  RED FLAGS: None   WEIGHT BEARING RESTRICTIONS: No  FALLS:  Has patient fallen in last 6 months? 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    PLOF: Independent  PATIENT GOALS: Increase R  hip ROM/ strength/ ambulate with no assistive device.  Decrease pain/ promote return to work.    NEXT MD VISIT: 08/22/23  OBJECTIVE:  Note: Objective measures were completed at Evaluation unless otherwise noted.  DIAGNOSTIC FINDINGS: See MD notes  PATIENT SURVEYS:  FOTO initial 36/ goal 60  COGNITION: Overall cognitive status: Within functional limits for tasks assessed   SENSATION: WFL  EDEMA:  Moderate R lower leg swelling.  (+) pitting edema in R lower leg/ ankle.  Educated pt. On compression stockings/ ankle pumps/ elevating.    MUSCLE LENGTH: Hamstrings: Right 62 deg (pain); Left 60 deg  POSTURE: rounded shoulders and forward head  PALPATION: R lower leg/ ankle pitting edema.  Unable to palpate R hip secondary to bandage  LOWER EXTREMITY ROM:  Active ROM Right eval Left eval  Hip flexion 65 deg. (AAROM) 101 deg  Hip extension    Hip abduction 30 deg. 55 deg.  Hip adduction    Hip internal rotation    Hip external rotation    Knee flexion 82 deg. (Pain) 125 deg.  Knee extension 6 deg. 0 deg.  Ankle dorsiflexion    Ankle plantarflexion    Ankle inversion    Ankle eversion     (Blank rows = not tested)  LOWER EXTREMITY MMT:  MMT Right eval Left eval  Hip flexion 2/5 4/5  Hip extension    Hip abduction 3/5 4/5  Hip adduction    Hip internal rotation    Hip external rotation    Knee flexion 4-/5 5/5  Knee extension 4/5 pain 5/5  Ankle dorsiflexion 4/5 4/5  Ankle plantarflexion    Ankle inversion    Ankle eversion     (Blank rows = not tested)  LOWER EXTREMITY SPECIAL TESTS:  NT  FUNCTIONAL TESTS:  5 times sit to stand: TBD 6 minute walk test: TBD  GAIT: Distance walked: in clinic Assistive device utilized: Walker - 2 wheeled Level of assistance: Modified independence Comments: Moderate R antalgic gait pattern with limited R hip flexion/ step length.  Pt. Reports increase R hip pain with increase activity.   STS: 26.33 sec.  No UE assist/  slight increase in R hip discomfort during 4-5 reps.    09/18/23: MMT Right Left  Hip flexion 4-/5 4/5  Hip extension 4-/5 3+/5  Hip abduction 4/5 4/5  Hip adduction    Hip internal rotation    Hip external rotation    Knee flexion 4/5 4/5  Knee extension 4/5 (sore) 5/5  Ankle dorsiflexion 5/5 5/5  Ankle plantarflexion    Ankle inversion    Ankle eversion     (Blank rows = not tested)  STS: 10.50 seconds.  No UE assist/ slight increase in R hip discomfort with initial stand.   = 1640 ft.     Supine R marching/ SLR 10x each.  Supine R hip flexion 109 deg. AROM/ 111 deg. PROM.  L hip flexion  113 deg.                                                                                                                             TREATMENT DATE: 10/09/2023  Subjective:    Pt. Arrives to treatment session prepared and early. Pt. Reports R hip stiffness/soreness this morning.  No c/o pain.  Pt. Rode stationary bike this past weekend for 15 minutes.    There.ex.:  Scifit L7 11 minutes B LE only.  No increase c/o pain.  Discussed recent increase in R hip soreness.    Walking around PT clinic with no SPC working on consistent gait pattern/ heel strike/ arm swing.  Cuing to increase cadence  High marching   Nautilus: 140# resisted gait all 4-planes 6x.    Supine generalized R hip/hamstring stretches.   Discussed HEP and discharging use of SPC  There.act.:  Blaze pod (focus) at star with 6 pods and Random set up.  GTB above knees.  3x 1.5 minutes each.  Short rest breaks between sets.    TRX lateral squat 2x15 with BOSU ball on R/L.      TRX Lunge  2x15 Bodyweight.    PATIENT EDUCATION:  Education details: Discussed HEP ([id number]), ice pack.  Person educated: Patient and Spouse Education method: Explanation, Demonstration, and Handouts Education comprehension: verbalized understanding and returned demonstration  HOME EXERCISE PROGRAM: Access Code: [email or web address] URL:  [email or web address] Date: 08/21/2023 Prepared by: Dorene Grebe  Exercises - Standing March with Counter Support  - 2 x daily - 7 x weekly - 2 sets - 10 reps - Standing Hip Abduction with Unilateral Counter Support  - 2 x daily - 7 x weekly - 2 sets - 10 reps - Heel Raises with Counter Support  - 2 x daily - 7 x weekly - 2 sets - 10 reps - Supine Heel Slide  - 2 x daily - 7 x weekly - 2 sets - 10 reps - Supine Gluteal Sets  - 2 x daily - 7 x weekly - 2 sets - 10 reps - Supine Single Leg Ankle Pumps  - 2 x daily - 7 x weekly - 2 sets - 10 reps - Seated Knee Extension AROM  - 2 x daily - 7 x weekly - 2 sets - 10 reps.  ASSESSMENT:  CLINICAL IMPRESSION:  Today's session focused on B LE strength, specifically R hip flexion. Pt. Is progressing well with more normalized gait patterns.  Pt. Expressed general soreness but no pain during today's exercises. Marked increase in R hip flexion AROM and hamstring length during supine stretches today.  Pt. Is highly compliant with all assigned tasks during treatment session and exhibited less fatigue/rest between sets.  PT discussed pt. Return to work tasks and ambulating without use of SPC.  Pt. Will benefit from skilled PT services to increase R hip ROM/ strength to improve pain-free functional mobility/ return to work.       OBJECTIVE  IMPAIRMENTS: Abnormal gait, decreased activity tolerance, decreased balance, decreased endurance, decreased mobility, difficulty walking, decreased ROM, decreased strength, increased edema, impaired flexibility, improper body mechanics, postural dysfunction, and pain.   ACTIVITY LIMITATIONS: carrying, lifting, bending, sitting, standing, squatting, stairs, transfers, and locomotion level  PARTICIPATION LIMITATIONS: cleaning, driving, community activity, occupation, and yard work  PERSONAL FACTORS: Fitness and Past/current experiences are also affecting patient's functional outcome.   REHAB POTENTIAL:  Good  CLINICAL DECISION MAKING: Stable/uncomplicated  EVALUATION COMPLEXITY: Low   GOALS: Goals reviewed with patient? Yes  SHORT TERM GOALS: Target date: 09/18/23 Pt. Independent with HEP to increase R hip AROM to WNL as compared to L hip to improve transfers/ gait with no assistive device.  Baseline:  see above Goal status: Partially met  LONG TERM GOALS: Target date: 10/16/23 Pt. Will increase FOTO to 64 to improve pain-free functional mobility.   Baseline: initial 36 Goal status: INITIAL  2.  Pt. Will increase R hip strength to 4/5 MMT to improve ability to step into truck.   Baseline: see above Goal status: INITIAL  3.  Pt. Will ambulate community distances with normalized gait pattern and no increase c/o pain independently to promote return to work.   Baseline: Mod. I with RW/ moderate R antalgic gait.   Goal status: INITIAL  4.  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    Baseline: currently not able to work  Goal status: INITIAL  PLAN:  PT FREQUENCY: 2x/week  PT DURATION: 8 weeks  PLANNED INTERVENTIONS: 97110-Therapeutic exercises, 97530- Therapeutic activity, [id number]- Neuromuscular re-education, 97535- Self Care, [id number]- Manual therapy, [phone]- Gait training, Patient/Family education, Balance training, Stair training, Cryotherapy, and Moist heat  PLAN FOR NEXT SESSION:  Progress hip strength.  CHECK GOALS/ recert     Cammie Mcgee, PT, DPT # [phone] Physical Therapist - [facility]  [place], [place] 10/09/2023, 8:54 AM

## 2023-10-11 ENCOUNTER — Ambulatory Visit: Payer: BC Managed Care – PPO | Admitting: Physical Therapy

## 2023-10-11 ENCOUNTER — Encounter: Payer: Self-pay | Admitting: Physical Therapy

## 2023-10-11 DIAGNOSIS — M25551 Pain in right hip: Secondary | ICD-10-CM | POA: Diagnosis not present

## 2023-10-11 DIAGNOSIS — M25651 Stiffness of right hip, not elsewhere classified: Secondary | ICD-10-CM | POA: Diagnosis not present

## 2023-10-11 DIAGNOSIS — M6281 Muscle weakness (generalized): Secondary | ICD-10-CM | POA: Diagnosis not present

## 2023-10-11 DIAGNOSIS — Z96641 Presence of right artificial hip joint: Secondary | ICD-10-CM

## 2023-10-11 DIAGNOSIS — R269 Unspecified abnormalities of gait and mobility: Secondary | ICD-10-CM | POA: Diagnosis not present

## 2023-10-11 NOTE — Therapy (Signed)
 OUTPATIENT PHYSICAL THERAPY LOWER EXTREMITY TREATMENT  Patient Name: Duane Grant MRN: [id number] DOB:1955-09-28, 68 y.o., male Today's Date: 10/11/2023  END OF SESSION:  PT End of Session - 10/11/23 0716     Visit Number 16    Number of Visits 16    Date for PT Re-Evaluation 10/16/23    PT Start Time 0711    PT Stop Time 0815    PT Time Calculation (min) 64 min    Behavior During Therapy [facility] for tasks assessed/performed            Past Medical History:  Diagnosis Date   Arthritis    Cancer (HCC) 2009   Prostate, radiation but no surgery   Diabetes mellitus without complication (HCC)    Elevated PSA    H/O removal of cyst 12/02/2019   back    History of prostate cancer    Hyperlipidemia    Hypertension    Past Surgical History:  Procedure Laterality Date   COLONOSCOPY WITH PROPOFOL N/A 01/16/2023   Procedure: COLONOSCOPY WITH PROPOFOL;  Surgeon: Toney Reil, MD;  Location: [facility] ENDOSCOPY;  Service: Gastroenterology;  Laterality: N/A;   HERNIA REPAIR  2010   Umbilical   HERNIA REPAIR  2010   JOINT REPLACEMENT     Removal Fatty Tumor Right 2015   [facility], right side of chin/jaw   TOOTH EXTRACTION  12/15/2020   TOTAL HIP ARTHROPLASTY Left 06/09/2016   Procedure: TOTAL HIP ARTHROPLASTY;  Surgeon: Juanell Fairly, MD;  Location: [facility] ORS;  Service: Orthopedics;  Laterality: Left;   Patient Active Problem List   Diagnosis Date Noted   Positive colorectal cancer screening using Cologuard test 01/16/2023   Cecal polyp 01/16/2023   Adenomatous polyp of colon 01/16/2023   Encounter for Department of Transportation (DOT) examination for trucking license 64/33/2951   Hearing loss, left 06/10/2020   Drug-induced myopathy 05/11/2020   Chronic bilateral low back pain without sciatica 12/23/2019   Hyperlipidemia associated with type 2 diabetes mellitus (HCC) 11/11/2019   Recurrent HSV (herpes simplex virus) 03/11/2019   Arthritis of carpometacarpal (CMC)  joint of left thumb 03/11/2019   Obesity (BMI 30.0-34.9) 03/11/2019   Status post total hip replacement, left 06/09/2016   Hip pain 05/18/2016   Type 2 diabetes mellitus with other specified complication (HCC) 02/06/2014   Prostate cancer (HCC) 02/06/2014   Parotid mass 12/23/2013   Essential hypertension 12/06/2013   Herpesviral infection of urogenital system 12/06/2013   Erectile dysfunction 12/06/2013   Neck mass 12/06/2013    PCP: Smitty Cords, DO  REFERRING PROVIDER: Altamese Cabal, PA-C  REFERRING DIAG: Presence of unspecified artificial hip joint   THERAPY DIAG:  History of total hip replacement, right  Pain in right hip  Hip joint stiffness, right  Gait difficulty  Muscle weakness (generalized)  Rationale for Evaluation and Treatment: Rehabilitation  ONSET DATE: 08/10/23  SUBJECTIVE:   SUBJECTIVE STATEMENT: Pt. S/p R posterior hip replacement on 08/10/23.  Pt. Returns to MD on 08/22/23 for bandage removal/ reassessment.  Pt. Reports 7-8/10 R hip pain at rest and is hoping to get more pain meds at MD f/u tomorrow.  Pt. Reports 3/10 R hip pain at best with ice/ medications.  Pt. Reports no falls and is currently using RW to ambulate.  Pt. Currently sleeping in recliner chair.    PERTINENT HISTORY: See MD note.  No L hip issues.    PAIN:  Are you having pain? Yes: NPRS scale: 7-8/10 Pain location:  R hip Pain description: aching Aggravating factors: walking/ movement Relieving factors: ice/ medications  PRECAUTIONS: Anterior hip  RED FLAGS: None   WEIGHT BEARING RESTRICTIONS: No  FALLS:  Has patient fallen in last 6 months? 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    PLOF: Independent  PATIENT GOALS: Increase R  hip ROM/ strength/ ambulate with no assistive device.  Decrease pain/ promote return to work.    NEXT MD VISIT: 08/22/23  OBJECTIVE:  Note: Objective measures were completed at Evaluation unless otherwise noted.  DIAGNOSTIC FINDINGS: See MD notes  PATIENT SURVEYS:  FOTO initial 36/ goal 44  COGNITION: Overall cognitive status: Within functional limits for tasks assessed   SENSATION: WFL  EDEMA:  Moderate R lower leg swelling.  (+) pitting edema in R lower leg/ ankle.  Educated pt. On compression stockings/ ankle pumps/ elevating.    MUSCLE LENGTH: Hamstrings: Right 62 deg (pain); Left 60 deg  POSTURE: rounded shoulders and forward head  PALPATION: R lower leg/ ankle pitting edema.  Unable to palpate R hip secondary to bandage  LOWER EXTREMITY ROM:  Active ROM Right eval Left eval  Hip flexion 65 deg. (AAROM) 101 deg  Hip extension    Hip abduction 30 deg. 55 deg.  Hip adduction    Hip internal rotation    Hip external rotation    Knee flexion 82 deg. (Pain) 125 deg.  Knee extension 6 deg. 0 deg.  Ankle dorsiflexion    Ankle plantarflexion    Ankle inversion    Ankle eversion     (Blank rows = not tested)  LOWER EXTREMITY MMT:  MMT Right eval Left eval  Hip flexion 2/5 4/5  Hip extension    Hip abduction 3/5 4/5  Hip adduction    Hip internal rotation    Hip external rotation    Knee flexion 4-/5 5/5  Knee extension 4/5 pain 5/5  Ankle dorsiflexion 4/5 4/5  Ankle plantarflexion    Ankle inversion    Ankle eversion     (Blank rows = not tested)  LOWER EXTREMITY SPECIAL TESTS:  NT  FUNCTIONAL TESTS:  5 times sit to stand: TBD 6 minute walk test: TBD  GAIT: Distance walked: in clinic Assistive device utilized: Walker - 2 wheeled Level of assistance: Modified independence Comments: Moderate R antalgic gait pattern with limited R hip flexion/ step length.  Pt. Reports increase R hip pain with increase activity.   STS: 26.33 sec.  No UE assist/  slight increase in R hip discomfort during 4-5 reps.    09/18/23: MMT Right Left  Hip flexion 4-/5 4/5  Hip extension 4-/5 3+/5  Hip abduction 4/5 4/5  Hip adduction    Hip internal rotation    Hip external rotation    Knee flexion 4/5 4/5  Knee extension 4/5 (sore) 5/5  Ankle dorsiflexion 5/5 5/5  Ankle plantarflexion    Ankle inversion    Ankle eversion     (Blank rows = not tested)  STS: 10.50 seconds.  No UE assist/ slight increase in R hip discomfort with initial stand.   = 1640 ft.     Supine R marching/ SLR 10x each.  Supine R hip flexion 109 deg. AROM/ 111 deg. PROM.  L hip flexion  113 deg.                                                                                                                             TREATMENT DATE: 10/11/2023  Subjective:    Pt. Arrives to treatment session prepared and early without use of SPC. Pt. Reports R hip stiffness/soreness this morning and no pain.    There.ex.:  Scifit L7 5 minutes forward/ 5 min. Backwards with B LE only.  No increase c/o pain.  Discussed recent increase in R hip soreness.    Walking around PT gym with consistent step pattern/ upright posture (mirror feedback for shoulder position/ arm swing)- 6 laps x 2.    Nautilus: 140# resisted gait all 4-planes 8x.   Tandem gait in //-bars forward/backwards (no UE assist).  Standing hamstring stretches at 2nd step 5x each with static holds.    12" step ups/ downs with min. To no UE assist.    Supine SLR 10x on L/R.  Supine bridging with hip adduction with ball 10x.    Discussed HEP  There.act.:  TRX squats with equal wt. Bearing.  15x2  TRX lateral squat 15x2 with on R/L.      PATIENT EDUCATION:  Education details: Discussed HEP ([id number]), ice pack.  Person educated: Patient and Spouse Education method: Explanation, Demonstration, and Handouts Education comprehension: verbalized understanding and returned demonstration  HOME EXERCISE PROGRAM: Access  Code: [email or web address] URL: [email or web address] Date: 08/21/2023 Prepared by: Dorene Grebe  Exercises - Standing March with Counter Support  - 2 x daily - 7 x weekly - 2 sets - 10 reps - Standing Hip Abduction with Unilateral Counter Support  - 2 x daily - 7 x weekly - 2 sets - 10 reps - Heel Raises with Counter Support  - 2 x daily - 7 x weekly - 2 sets - 10 reps - Supine Heel Slide  - 2 x daily - 7 x weekly - 2 sets - 10 reps - Supine Gluteal Sets  - 2 x daily - 7 x weekly - 2 sets - 10 reps - Supine Single Leg Ankle Pumps  - 2 x daily - 7 x weekly - 2 sets - 10 reps - Seated Knee Extension AROM  - 2 x daily - 7 x weekly - 2 sets - 10 reps.  ASSESSMENT:  CLINICAL IMPRESSION:  Today's session focused on B LE strength, specifically R hip flexion. Pt. Is progressing well with more normalized gait patterns.  Pt. Expressed general soreness but no pain during today's exercises. Marked increase in R hip flexion AROM and hamstring length during supine stretches today.  Pt. Is highly compliant with all assigned tasks during treatment session and exhibited less fatigue/rest between sets.  PT discussed pt. Return to work tasks and ambulating without use of SPC.  Probable discharge from PT next tx. Session.      OBJECTIVE IMPAIRMENTS: Abnormal gait, decreased activity tolerance, decreased balance, decreased endurance, decreased  mobility, difficulty walking, decreased ROM, decreased strength, increased edema, impaired flexibility, improper body mechanics, postural dysfunction, and pain.   ACTIVITY LIMITATIONS: carrying, lifting, bending, sitting, standing, squatting, stairs, transfers, and locomotion level  PARTICIPATION LIMITATIONS: cleaning, driving, community activity, occupation, and yard work  PERSONAL FACTORS: Fitness and Past/current experiences are also affecting patient's functional outcome.   REHAB POTENTIAL: Good  CLINICAL DECISION MAKING: Stable/uncomplicated  EVALUATION  COMPLEXITY: Low   GOALS: Goals reviewed with patient? Yes  SHORT TERM GOALS: Target date: 09/18/23 Pt. Independent with HEP to increase R hip AROM to WNL as compared to L hip to improve transfers/ gait with no assistive device.  Baseline:  see above Goal status: Partially met  LONG TERM GOALS: Target date: 10/16/23 Pt. Will increase FOTO to 64 to improve pain-free functional mobility.   Baseline: initial 36 Goal status: INITIAL  2.  Pt. Will increase R hip strength to 4/5 MMT to improve ability to step into truck.   Baseline: see above Goal status: INITIAL  3.  Pt. Will ambulate community distances with normalized gait pattern and no increase c/o pain independently to promote return to work.   Baseline: Mod. I with RW/ moderate R antalgic gait.   Goal status: INITIAL  4.  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    Baseline: currently not able to work  Goal status: INITIAL  PLAN:  PT FREQUENCY: 2x/week  PT DURATION: 8 weeks  PLANNED INTERVENTIONS: 97110-Therapeutic exercises, 97530- Therapeutic activity, [id number]- Neuromuscular re-education, 97535- Self Care, [id number]- Manual therapy, [phone]- Gait training, Patient/Family education, Balance training, Stair training, Cryotherapy, and Moist heat  PLAN FOR NEXT SESSION:  RECERT/ DISCHARGE next tx. session    Cammie Mcgee, PT, DPT # [phone] Physical Therapist - [facility]  10/11/2023, 9:06 AM

## 2023-10-16 ENCOUNTER — Ambulatory Visit: Payer: BC Managed Care – PPO | Attending: Orthopedic Surgery | Admitting: Physical Therapy

## 2023-10-16 DIAGNOSIS — M25551 Pain in right hip: Secondary | ICD-10-CM | POA: Diagnosis not present

## 2023-10-16 DIAGNOSIS — R269 Unspecified abnormalities of gait and mobility: Secondary | ICD-10-CM | POA: Diagnosis not present

## 2023-10-16 DIAGNOSIS — Z96641 Presence of right artificial hip joint: Secondary | ICD-10-CM | POA: Insufficient documentation

## 2023-10-16 DIAGNOSIS — M6281 Muscle weakness (generalized): Secondary | ICD-10-CM | POA: Diagnosis not present

## 2023-10-16 DIAGNOSIS — M25651 Stiffness of right hip, not elsewhere classified: Secondary | ICD-10-CM | POA: Insufficient documentation

## 2023-10-16 NOTE — Therapy (Signed)
 OUTPATIENT PHYSICAL THERAPY LOWER EXTREMITY RECERTIFICATION/ DISCHARGE  Patient Name: Duane Grant MRN: [id number] DOB:October 04, 1955, 68 y.o., male Today's Date: 10/16/2023  END OF SESSION:  PT End of Session - 10/16/23 0715     Visit Number 17    Number of Visits 17    Date for PT Re-Evaluation 10/16/23    PT Start Time 0715    PT Stop Time 0805    PT Time Calculation (min) 50 min    Behavior During Therapy [facility] for tasks assessed/performed            Past Medical History:  Diagnosis Date   Arthritis    Cancer (HCC) 2009   Prostate, radiation but no surgery   Diabetes mellitus without complication (HCC)    Elevated PSA    H/O removal of cyst 12/02/2019   back    History of prostate cancer    Hyperlipidemia    Hypertension    Past Surgical History:  Procedure Laterality Date   COLONOSCOPY WITH PROPOFOL N/A 01/16/2023   Procedure: COLONOSCOPY WITH PROPOFOL;  Surgeon: Toney Reil, MD;  Location: [facility] ENDOSCOPY;  Service: Gastroenterology;  Laterality: N/A;   HERNIA REPAIR  2010   Umbilical   HERNIA REPAIR  2010   JOINT REPLACEMENT     Removal Fatty Tumor Right 2015   [facility], right side of chin/jaw   TOOTH EXTRACTION  12/15/2020   TOTAL HIP ARTHROPLASTY Left 06/09/2016   Procedure: TOTAL HIP ARTHROPLASTY;  Surgeon: Juanell Fairly, MD;  Location: [facility] ORS;  Service: Orthopedics;  Laterality: Left;   Patient Active Problem List   Diagnosis Date Noted   Positive colorectal cancer screening using Cologuard test 01/16/2023   Cecal polyp 01/16/2023   Adenomatous polyp of colon 01/16/2023   Encounter for Department of Transportation (DOT) examination for trucking license 40/98/1191   Hearing loss, left 06/10/2020   Drug-induced myopathy 05/11/2020   Chronic bilateral low back pain without sciatica 12/23/2019   Hyperlipidemia associated with type 2 diabetes mellitus (HCC) 11/11/2019   Recurrent HSV (herpes simplex virus) 03/11/2019   Arthritis of  carpometacarpal (CMC) joint of left thumb 03/11/2019   Obesity (BMI 30.0-34.9) 03/11/2019   Status post total hip replacement, left 06/09/2016   Hip pain 05/18/2016   Type 2 diabetes mellitus with other specified complication (HCC) 02/06/2014   Prostate cancer (HCC) 02/06/2014   Parotid mass 12/23/2013   Essential hypertension 12/06/2013   Herpesviral infection of urogenital system 12/06/2013   Erectile dysfunction 12/06/2013   Neck mass 12/06/2013    PCP: Smitty Cords, DO  REFERRING PROVIDER: Altamese Cabal, PA-C  REFERRING DIAG: Presence of unspecified artificial hip joint   THERAPY DIAG:  History of total hip replacement, right  Pain in right hip  Hip joint stiffness, right  Gait difficulty  Muscle weakness (generalized)  Rationale for Evaluation and Treatment: Rehabilitation  ONSET DATE: 08/10/23  SUBJECTIVE:   SUBJECTIVE STATEMENT: Pt. S/p R posterior hip replacement on 08/10/23.  Pt. Returns to MD on 08/22/23 for bandage removal/ reassessment.  Pt. Reports 7-8/10 R hip pain at rest and is hoping to get more pain meds at MD f/u tomorrow.  Pt. Reports 3/10 R hip pain at best with ice/ medications.  Pt. Reports no falls and is currently using RW to ambulate.  Pt. Currently sleeping in recliner chair.    PERTINENT HISTORY: See MD note.  No L hip issues.    PAIN:  Are you having pain? Yes: NPRS scale: 7-8/10 Pain  location: R hip Pain description: aching Aggravating factors: walking/ movement Relieving factors: ice/ medications  PRECAUTIONS: Anterior hip  RED FLAGS: None   WEIGHT BEARING RESTRICTIONS: No  FALLS:  Has patient fallen in last 6 months? 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    PLOF:  Independent  PATIENT GOALS: Increase R hip ROM/ strength/ ambulate with no assistive device.  Decrease pain/ promote return to work.    NEXT MD VISIT: 08/22/23  OBJECTIVE:  Note: Objective measures were completed at Evaluation unless otherwise noted.  DIAGNOSTIC FINDINGS: See MD notes  PATIENT SURVEYS:  FOTO initial 36/ goal 65  COGNITION: Overall cognitive status: Within functional limits for tasks assessed   SENSATION: WFL  EDEMA:  Moderate R lower leg swelling.  (+) pitting edema in R lower leg/ ankle.  Educated pt. On compression stockings/ ankle pumps/ elevating.    MUSCLE LENGTH: Hamstrings: Right 62 deg (pain); Left 60 deg  POSTURE: rounded shoulders and forward head  PALPATION: R lower leg/ ankle pitting edema.  Unable to palpate R hip secondary to bandage  LOWER EXTREMITY ROM:  Active ROM Right eval Left eval  Hip flexion 65 deg. (AAROM) 101 deg  Hip extension    Hip abduction 30 deg. 55 deg.  Hip adduction    Hip internal rotation    Hip external rotation    Knee flexion 82 deg. (Pain) 125 deg.  Knee extension 6 deg. 0 deg.  Ankle dorsiflexion    Ankle plantarflexion    Ankle inversion    Ankle eversion     (Blank rows = not tested)  LOWER EXTREMITY MMT:  MMT Right eval Left eval  Hip flexion 2/5 4/5  Hip extension    Hip abduction 3/5 4/5  Hip adduction    Hip internal rotation    Hip external rotation    Knee flexion 4-/5 5/5  Knee extension 4/5 pain 5/5  Ankle dorsiflexion 4/5 4/5  Ankle plantarflexion    Ankle inversion    Ankle eversion     (Blank rows = not tested)  LOWER EXTREMITY SPECIAL TESTS:  NT  FUNCTIONAL TESTS:  5 times sit to stand: TBD 6 minute walk test: TBD  GAIT: Distance walked: in clinic Assistive device utilized: Walker - 2 wheeled Level of assistance: Modified independence Comments: Moderate R antalgic gait pattern with limited R hip flexion/ step length.  Pt. Reports increase R hip pain with increase  activity.   STS: 26.33 sec.  No UE assist/ slight increase in R hip discomfort during 4-5 reps.    09/18/23: MMT Right Left  Hip flexion 4-/5 4/5  Hip extension 4-/5 3+/5  Hip abduction 4/5 4/5  Hip adduction    Hip internal rotation    Hip external rotation    Knee flexion 4/5 4/5  Knee extension 4/5 (sore) 5/5  Ankle dorsiflexion 5/5 5/5  Ankle plantarflexion    Ankle inversion    Ankle eversion     (Blank rows = not tested)  STS: 10.50 seconds.  No UE assist/ slight increase in R hip discomfort with initial stand.   = 1640 ft.     Supine R marching/ SLR 10x each.  Supine R hip flexion 109 deg. AROM/ 111 deg. PROM.  L hip  flexion 113 deg.                                                                                                                             TREATMENT DATE: 10/16/2023  Subjective:    Pt. Arrives to treatment session prepared and early without use of SPC. Pt. Reports doing well and staying active with household chores/ walking.  Pt. Using bike at home and states he is getting in/out of car with no issues.  MD f/u on 3/21 and pt. Will hopefully be released to work on 3/25.    There.ex.:  Scifit L8 6 minutes forward/ 6 min. Backwards with B LE only.  No increase c/o pain.    Reassessment of hip/knee/ankle AROM in standing posture.    STS: 8.24 seconds.  No UE assist/ no R hip discomfort but a little stiffness/soreness in hip.    Walking around PT gym/ hallway with consistent step pattern/ upright posture (mirror feedback for shoulder position/ arm swing)- 6 laps x 2.  Added alt. UE/LE touches in hallway with increase hip flexion.    Nautilus: 140# resisted gait all 4-planes 7x.  No UE assit. Cuing for increase step pattern.  Standing hamstring stretches/ lunges at 2nd step 5x each with static holds.    BOSU step ups/ downs in //-bars 10x on L/R with min. To no UE assist.    Discussed HEP  There.act.:  TRX squats with equal wt. Bearing.   20x2  TRX lateral squat 100x2 with on R/L.  Increase squat.    PATIENT EDUCATION:  Education details: Discussed HEP ([id number]), ice pack.  Person educated: Patient and Spouse Education method: Explanation, Demonstration, and Handouts Education comprehension: verbalized understanding and returned demonstration  HOME EXERCISE PROGRAM: Access Code: [email or web address] URL: [email or web address] Date: 08/21/2023 Prepared by: Dorene Grebe  Exercises - Standing March with Counter Support  - 2 x daily - 7 x weekly - 2 sets - 10 reps - Standing Hip Abduction with Unilateral Counter Support  - 2 x daily - 7 x weekly - 2 sets - 10 reps - Heel Raises with Counter Support  - 2 x daily - 7 x weekly - 2 sets - 10 reps - Supine Heel Slide  - 2 x daily - 7 x weekly - 2 sets - 10 reps - Supine Gluteal Sets  - 2 x daily - 7 x weekly - 2 sets - 10 reps - Supine Single Leg Ankle Pumps  - 2 x daily - 7 x weekly - 2 sets - 10 reps - Seated Knee Extension AROM  - 2 x daily - 7 x weekly - 2 sets - 10 reps.  ASSESSMENT:  CLINICAL IMPRESSION:  Today's session focused on B LE strength, specifically R hip flexion. Pt. Is progressing well with more normalized gait patterns.  Pt. Expressed general soreness but no pain during today's exercises. Marked improvement towards all PT goals.  Pt. Is highly compliant with all assigned  tasks during treatment session and exhibited less fatigue/rest between sets.  PT discussed pt. Return to work tasks and continuing with current HEP.  Pt. Instructed to contact PT if any questions or issues after discharge.      OBJECTIVE IMPAIRMENTS: Abnormal gait, decreased activity tolerance, decreased balance, decreased endurance, decreased mobility, difficulty walking, decreased ROM, decreased strength, increased edema, impaired flexibility, improper body mechanics, postural dysfunction, and pain.   ACTIVITY LIMITATIONS: carrying, lifting, bending, sitting, standing, squatting,  stairs, transfers, and locomotion level  PARTICIPATION LIMITATIONS: cleaning, driving, community activity, occupation, and yard work  PERSONAL FACTORS: Fitness and Past/current experiences are also affecting patient's functional outcome.   REHAB POTENTIAL: Good  CLINICAL DECISION MAKING: Stable/uncomplicated  EVALUATION COMPLEXITY: Low   GOALS: Goals reviewed with patient? Yes  SHORT TERM GOALS: Target date: 09/18/23 Pt. Independent with HEP to increase R hip AROM to WNL as compared to L hip to improve transfers/ gait with no assistive device.  Baseline:  see above Goal status:  Goal met  LONG TERM GOALS: Target date: 10/16/23 Pt. Will increase FOTO to 64 to improve pain-free functional mobility.   Baseline: initial 36 Goal status: Not able reassess  2.  Pt. Will increase R hip strength to 4/5 MMT to improve ability to step into truck.   Baseline: see above Goal status: Goal met  3.  Pt. Will ambulate community distances with normalized gait pattern and no increase c/o pain independently to promote return to work.   Baseline: Mod. I with RW/ moderate R antalgic gait.  3/3: normalized gait with no assistive device Goal status: Goal met  4.  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    Baseline: currently not able to work.  3/3: will hopefully be release to work at end of March  Goal status: On-going  PLAN:  PT FREQUENCY: 2x/week  PT DURATION: 8 weeks  PLANNED INTERVENTIONS: 97110-Therapeutic exercises, 97530- Therapeutic activity, 97112- Neuromuscular re-education, 97535- Self Care, 16109- Manual therapy, [phone]- Gait training, Patient/Family education, Balance training, Stair training, Cryotherapy, and Moist heat  PLAN FOR NEXT SESSION:  Discharge  Cammie Mcgee, PT, DPT # [phone] Physical Therapist - [facility]  10/16/2023, 8:45 AM

## 2023-10-27 ENCOUNTER — Telehealth: Payer: Self-pay

## 2023-10-27 NOTE — Telephone Encounter (Signed)
 Can you check with patient and or pharmacy first?  On 10/03/23 It was recently ordered for 84 day supply + 3 refills. To [facility] [place].  Maybe find out if he is actually receiving 3 month supply or if they give him only 1 month at a time. Maybe we need to re order as monthly supply .   Let me know!  OZEMPIC, 1 MG/DOSE, 4 MG/3ML SOPN 9 mL 3 10/03/2023 --   Sig - Route: Inject 1 mg as directed once a week. - Injection   Sent to pharmacy as: OZEMPIC, 1 MG/DOSE, 4 MG/3ML Solution Pen-injector   E-Prescribing Status: Receipt confirmed by pharmacy (10/03/2023 10:41 AM EST)    Saralyn Pilar, DO [facility] 10/27/2023, 5:37 PM

## 2023-11-07 DIAGNOSIS — E1165 Type 2 diabetes mellitus with hyperglycemia: Secondary | ICD-10-CM | POA: Diagnosis not present

## 2023-11-07 DIAGNOSIS — I10 Essential (primary) hypertension: Secondary | ICD-10-CM | POA: Diagnosis not present

## 2023-11-07 DIAGNOSIS — I5022 Chronic systolic (congestive) heart failure: Secondary | ICD-10-CM | POA: Diagnosis not present

## 2023-11-07 DIAGNOSIS — E1169 Type 2 diabetes mellitus with other specified complication: Secondary | ICD-10-CM | POA: Diagnosis not present

## 2023-11-27 ENCOUNTER — Telehealth: Payer: Self-pay

## 2023-11-27 NOTE — Telephone Encounter (Signed)
 Copied from CRM [id number]. Topic: Appointments - Appointment Cancel/Reschedule >> Nov 27, 2023 10:30 AM [place] wrote: Patient/patient representative is calling to cancel or reschedule an appointment. Pt stated that he no longer goes to this clinic any more. He has a new primary doctor now. He stated that he keeps getting appointment notifications from this clinic that he wants to cancel all of them. Refer to attachments for appointment information.

## 2023-12-04 ENCOUNTER — Other Ambulatory Visit: Payer: Self-pay

## 2023-12-04 DIAGNOSIS — D120 Benign neoplasm of cecum: Secondary | ICD-10-CM | POA: Diagnosis not present

## 2023-12-05 ENCOUNTER — Other Ambulatory Visit: Payer: Self-pay | Admitting: Family Medicine

## 2023-12-05 DIAGNOSIS — M545 Low back pain, unspecified: Secondary | ICD-10-CM

## 2023-12-06 NOTE — Telephone Encounter (Signed)
 Requested medication (s) are due for refill today - yes  Requested medication (s) are on the active medication list -yes  Future visit scheduled -no  Last refill: 06/05/23 #90 1RF  Notes to clinic: fails lab protocol- over 1 year- 11/21/22  Requested Prescriptions  Pending Prescriptions Disp Refills   meloxicam  (MOBIC ) 15 MG tablet [Pharmacy Med Name: Meloxicam  15 MG Oral Tablet] 90 tablet 0    Sig: Take 1 tablet by mouth once daily     Analgesics:  COX2 Inhibitors Failed - 12/06/2023  9:44 AM      Failed - Manual Review: Labs are only required if the patient has taken medication for more than 8 weeks.      Failed - HGB in normal range and within 360 days    Hemoglobin  Date Value Ref Range Status  11/21/2022 12.7 (L) 13.2 - 17.1 g/dL Final         Failed - Cr in normal range and within 360 days    Creat  Date Value Ref Range Status  11/21/2022 0.86 0.70 - 1.35 mg/dL Final   Creatinine, Urine  Date Value Ref Range Status  11/28/2022 15 (L) 20 - 320 mg/dL Final         Failed - HCT in normal range and within 360 days    HCT  Date Value Ref Range Status  11/21/2022 37.4 (L) 38.5 - 50.0 % Final         Failed - AST in normal range and within 360 days    AST  Date Value Ref Range Status  11/21/2022 16 10 - 35 U/L Final         Failed - ALT in normal range and within 360 days    ALT  Date Value Ref Range Status  11/21/2022 19 9 - 46 U/L Final         Failed - eGFR is 30 or above and within 360 days    GFR, Est African American  Date Value Ref Range Status  10/26/2020 104 > OR = 60 mL/min/1.17m2 Final   GFR, Est Non African American  Date Value Ref Range Status  10/26/2020 90 > OR = 60 mL/min/1.67m2 Final   eGFR  Date Value Ref Range Status  11/21/2022 94 > OR = 60 mL/min/1.57m2 Final         Failed - Valid encounter within last 12 months    Recent Outpatient Visits   None            Passed - Patient is not pregnant         Requested  Prescriptions  Pending Prescriptions Disp Refills   meloxicam  (MOBIC ) 15 MG tablet [Pharmacy Med Name: Meloxicam  15 MG Oral Tablet] 90 tablet 0    Sig: Take 1 tablet by mouth once daily     Analgesics:  COX2 Inhibitors Failed - 12/06/2023  9:44 AM      Failed - Manual Review: Labs are only required if the patient has taken medication for more than 8 weeks.      Failed - HGB in normal range and within 360 days    Hemoglobin  Date Value Ref Range Status  11/21/2022 12.7 (L) 13.2 - 17.1 g/dL Final         Failed - Cr in normal range and within 360 days    Creat  Date Value Ref Range Status  11/21/2022 0.86 0.70 - 1.35 mg/dL Final   Creatinine, Urine  Date Value Ref  Range Status  11/28/2022 15 (L) 20 - 320 mg/dL Final         Failed - HCT in normal range and within 360 days    HCT  Date Value Ref Range Status  11/21/2022 37.4 (L) 38.5 - 50.0 % Final         Failed - AST in normal range and within 360 days    AST  Date Value Ref Range Status  11/21/2022 16 10 - 35 U/L Final         Failed - ALT in normal range and within 360 days    ALT  Date Value Ref Range Status  11/21/2022 19 9 - 46 U/L Final         Failed - eGFR is 30 or above and within 360 days    GFR, Est African American  Date Value Ref Range Status  10/26/2020 104 > OR = 60 mL/min/1.77m2 Final   GFR, Est Non African American  Date Value Ref Range Status  10/26/2020 90 > OR = 60 mL/min/1.31m2 Final   eGFR  Date Value Ref Range Status  11/21/2022 94 > OR = 60 mL/min/1.40m2 Final         Failed - Valid encounter within last 12 months    Recent Outpatient Visits   None            Passed - Patient is not pregnant

## 2023-12-11 ENCOUNTER — Encounter: Payer: Self-pay | Admitting: Family Medicine

## 2023-12-19 DIAGNOSIS — C61 Malignant neoplasm of prostate: Secondary | ICD-10-CM | POA: Diagnosis not present

## 2023-12-19 DIAGNOSIS — Z79818 Long term (current) use of other agents affecting estrogen receptors and estrogen levels: Secondary | ICD-10-CM | POA: Diagnosis not present

## 2024-01-11 ENCOUNTER — Telehealth: Payer: Self-pay

## 2024-01-11 NOTE — Telephone Encounter (Signed)
 Pharmacy Patient Advocate Encounter   Received notification from CoverMyMeds that prior authorization for Ozempic  (1 MG/DOSE) 4MG /3ML pen-injectors is required/requested.   Insurance verification completed.   The patient is insured through [employer] .   Per test claim: PA required; PA submitted to above mentioned insurance via CoverMyMeds Key/confirmation #/EOC [id number] Status is pending

## 2024-01-16 NOTE — Telephone Encounter (Signed)
 Error in CMM, per pt call, he is now being seen at a non [facility] facility with a new PCP, cancelling request

## 2024-01-18 ENCOUNTER — Telehealth: Payer: Self-pay

## 2024-01-18 NOTE — Telephone Encounter (Signed)
 Contacted patient on preferred number listed in notes for scheduled AWV. Patient stated he changed providers.

## 2024-01-23 DIAGNOSIS — K635 Polyp of colon: Secondary | ICD-10-CM | POA: Diagnosis not present

## 2024-01-23 DIAGNOSIS — Z7984 Long term (current) use of oral hypoglycemic drugs: Secondary | ICD-10-CM | POA: Diagnosis not present

## 2024-01-23 DIAGNOSIS — E785 Hyperlipidemia, unspecified: Secondary | ICD-10-CM | POA: Diagnosis not present

## 2024-01-23 DIAGNOSIS — Z7985 Long-term (current) use of injectable non-insulin antidiabetic drugs: Secondary | ICD-10-CM | POA: Diagnosis not present

## 2024-01-23 DIAGNOSIS — D120 Benign neoplasm of cecum: Secondary | ICD-10-CM | POA: Diagnosis not present

## 2024-01-23 DIAGNOSIS — Z8546 Personal history of malignant neoplasm of prostate: Secondary | ICD-10-CM | POA: Diagnosis not present

## 2024-01-23 DIAGNOSIS — I10 Essential (primary) hypertension: Secondary | ICD-10-CM | POA: Diagnosis not present

## 2024-01-23 DIAGNOSIS — E119 Type 2 diabetes mellitus without complications: Secondary | ICD-10-CM | POA: Diagnosis not present

## 2024-01-23 DIAGNOSIS — Z79899 Other long term (current) drug therapy: Secondary | ICD-10-CM | POA: Diagnosis not present

## 2024-01-23 DIAGNOSIS — D126 Benign neoplasm of colon, unspecified: Secondary | ICD-10-CM | POA: Diagnosis not present

## 2024-01-30 DIAGNOSIS — M25551 Pain in right hip: Secondary | ICD-10-CM | POA: Diagnosis not present

## 2024-02-12 DIAGNOSIS — I10 Essential (primary) hypertension: Secondary | ICD-10-CM | POA: Diagnosis not present

## 2024-02-12 DIAGNOSIS — E119 Type 2 diabetes mellitus without complications: Secondary | ICD-10-CM | POA: Diagnosis not present

## 2024-02-12 DIAGNOSIS — E1169 Type 2 diabetes mellitus with other specified complication: Secondary | ICD-10-CM | POA: Diagnosis not present

## 2024-02-12 DIAGNOSIS — I5022 Chronic systolic (congestive) heart failure: Secondary | ICD-10-CM | POA: Diagnosis not present

## 2024-03-11 DIAGNOSIS — R931 Abnormal findings on diagnostic imaging of heart and coronary circulation: Secondary | ICD-10-CM | POA: Diagnosis not present

## 2024-03-11 DIAGNOSIS — Z9189 Other specified personal risk factors, not elsewhere classified: Secondary | ICD-10-CM | POA: Diagnosis not present

## 2024-03-11 DIAGNOSIS — R079 Chest pain, unspecified: Secondary | ICD-10-CM | POA: Diagnosis not present

## 2024-03-11 DIAGNOSIS — I10 Essential (primary) hypertension: Secondary | ICD-10-CM | POA: Diagnosis not present

## 2024-03-25 DIAGNOSIS — C61 Malignant neoplasm of prostate: Secondary | ICD-10-CM | POA: Diagnosis not present

## 2024-03-25 DIAGNOSIS — Z79818 Long term (current) use of other agents affecting estrogen receptors and estrogen levels: Secondary | ICD-10-CM | POA: Diagnosis not present

## 2024-04-02 DIAGNOSIS — Z87891 Personal history of nicotine dependence: Secondary | ICD-10-CM | POA: Diagnosis not present

## 2024-04-02 DIAGNOSIS — E895 Postprocedural testicular hypofunction: Secondary | ICD-10-CM | POA: Diagnosis not present

## 2024-04-02 DIAGNOSIS — R232 Flushing: Secondary | ICD-10-CM | POA: Diagnosis not present

## 2024-04-02 DIAGNOSIS — Z5111 Encounter for antineoplastic chemotherapy: Secondary | ICD-10-CM | POA: Diagnosis not present

## 2024-04-02 DIAGNOSIS — E291 Testicular hypofunction: Secondary | ICD-10-CM | POA: Diagnosis not present

## 2024-04-02 DIAGNOSIS — M8589 Other specified disorders of bone density and structure, multiple sites: Secondary | ICD-10-CM | POA: Diagnosis not present

## 2024-04-02 DIAGNOSIS — C61 Malignant neoplasm of prostate: Secondary | ICD-10-CM | POA: Diagnosis not present

## 2024-05-07 DIAGNOSIS — N3001 Acute cystitis with hematuria: Secondary | ICD-10-CM | POA: Diagnosis not present

## 2024-05-07 DIAGNOSIS — E1165 Type 2 diabetes mellitus with hyperglycemia: Secondary | ICD-10-CM | POA: Diagnosis not present

## 2024-05-07 DIAGNOSIS — R300 Dysuria: Secondary | ICD-10-CM | POA: Diagnosis not present

## 2024-07-02 DIAGNOSIS — C61 Malignant neoplasm of prostate: Secondary | ICD-10-CM | POA: Diagnosis not present

## 2024-07-02 DIAGNOSIS — E895 Postprocedural testicular hypofunction: Secondary | ICD-10-CM | POA: Diagnosis not present

## 2024-07-22 DIAGNOSIS — Z96641 Presence of right artificial hip joint: Secondary | ICD-10-CM | POA: Diagnosis not present

## 2024-08-05 DIAGNOSIS — M5416 Radiculopathy, lumbar region: Secondary | ICD-10-CM | POA: Diagnosis not present

## 2024-08-12 DIAGNOSIS — Z1211 Encounter for screening for malignant neoplasm of colon: Secondary | ICD-10-CM | POA: Diagnosis not present

## 2024-08-12 DIAGNOSIS — Z0000 Encounter for general adult medical examination without abnormal findings: Secondary | ICD-10-CM | POA: Diagnosis not present

## 2024-08-12 DIAGNOSIS — C61 Malignant neoplasm of prostate: Secondary | ICD-10-CM | POA: Diagnosis not present

## 2024-08-21 ENCOUNTER — Ambulatory Visit: Attending: Orthopedic Surgery | Admitting: Physical Therapy

## 2024-08-21 ENCOUNTER — Encounter: Payer: Self-pay | Admitting: Physical Therapy

## 2024-08-21 DIAGNOSIS — M2560 Stiffness of unspecified joint, not elsewhere classified: Secondary | ICD-10-CM | POA: Diagnosis present

## 2024-08-21 DIAGNOSIS — M5459 Other low back pain: Secondary | ICD-10-CM | POA: Insufficient documentation

## 2024-08-21 DIAGNOSIS — M6281 Muscle weakness (generalized): Secondary | ICD-10-CM | POA: Diagnosis present

## 2024-08-21 NOTE — Therapy (Addendum)
 " OUTPATIENT PHYSICAL THERAPY LOWER EXTREMITY/ BACK EVALUATION  Patient Name: Duane Grant MRN: [id number] DOB:11/06/1955, 69 y.o., male Today's Date: 08/21/2024  END OF SESSION:  PT End of Session - 08/21/24 0943     Visit Number 1 of 4   Recert date: 09/18/24   PT Start/End Time 0935 to 1028   53 minutes         Past Medical History:  Diagnosis Date   Arthritis    Cancer [facility]) 2009   Prostate, radiation but no surgery   Diabetes mellitus without complication (HCC)    Elevated PSA    H/O removal of cyst 12/02/2019   back    History of prostate cancer    Hyperlipidemia    Hypertension    Past Surgical History:  Procedure Laterality Date   COLONOSCOPY WITH PROPOFOL  N/A 01/16/2023   Procedure: COLONOSCOPY WITH PROPOFOL ;  Surgeon: Unk Corinn Skiff, MD;  Location: [facility] ENDOSCOPY;  Service: Gastroenterology;  Laterality: N/A;   HERNIA REPAIR  2010   Umbilical   HERNIA REPAIR  2010   JOINT REPLACEMENT     Removal Fatty Tumor Right 2015   [facility], right side of chin/jaw   TOOTH EXTRACTION  12/15/2020   TOTAL HIP ARTHROPLASTY Left 06/09/2016   Procedure: TOTAL HIP ARTHROPLASTY;  Surgeon: Franky Cranker, MD;  Location: [facility] ORS;  Service: Orthopedics;  Laterality: Left;   Patient Active Problem List   Diagnosis Date Noted   Positive colorectal cancer screening using Cologuard test 01/16/2023   Cecal polyp 01/16/2023   Adenomatous polyp of colon 01/16/2023   Encounter for Department of Transportation (DOT) examination for trucking license [id number]/7977   Hearing loss, left 06/10/2020   Drug-induced myopathy 05/11/2020   Chronic bilateral low back pain without sciatica 12/23/2019   Hyperlipidemia associated with type 2 diabetes mellitus (HCC) 11/11/2019   Recurrent HSV (herpes simplex virus) 03/11/2019   Arthritis of carpometacarpal (CMC) joint of left thumb 03/11/2019   Obesity (BMI 30.0-34.9) 03/11/2019   Status post total hip replacement, left 06/09/2016   Hip  pain 05/18/2016   Type 2 diabetes mellitus with other specified complication (HCC) 02/06/2014   Prostate cancer (HCC) 02/06/2014   Parotid mass 12/23/2013   Essential hypertension 12/06/2013   Herpesviral infection of urogenital system 12/06/2013   Erectile dysfunction 12/06/2013   Neck mass 12/06/2013    PCP: Edman Marsa PARAS, DO  REFERRING PROVIDER: Dow Frederic BIRCH, MD  REFERRING DIAG: Diagnosis  M54.16 (ICD-10-CM) - Radiculopathy, lumbar region   THERAPY DIAG:  Other low back pain  Joint stiffness of spine  Muscle weakness (generalized)  Rationale for Evaluation and Treatment: Rehabilitation  ONSET DATE: Chronic (recent exacerbation)   SUBJECTIVE:  SUBJECTIVE STATEMENT: Pt. Works as naval architect and c/o chronic R LBP with R LE radicular symptoms to R thigh.  Pt. States pain/ radicular symptoms worsen with prolonged sitting in truck and standing.  Pt. C/o 2/10 LBP currently at rest and 9/10 while standing from chair (shooting/ aching pain).  Pt. Known to PT clinic after rehabbing from R THA a year ago.    PERTINENT HISTORY:  See MD notes  PAIN:  Are you having pain? Yes: NPRS scale: 2/10 Pain location: low back/ R radicular symptoms to upper thigh Pain description: shooting/ aching Aggravating factors: prolonged sitting/ standing/ climbing into truck Relieving factors: rest  PRECAUTIONS: None  RED FLAGS: None   WEIGHT BEARING RESTRICTIONS: No  FALLS:  Has patient fallen in last 6 months? No  LIVING ENVIRONMENT: Lives with: lives with their spouse Lives in: House/apartment Has following equipment at home: None  OCCUPATION: truck driver  PLOF: Independent  PATIENT GOALS: Decrease back pain/ R LE radicular symptoms.   NEXT MD VISIT: PRN  OBJECTIVE:  Note:  Objective measures were completed at Evaluation unless otherwise noted.  DIAGNOSTIC FINDINGS:  Not recently  PATIENT SURVEYS:  MODI: 30% self-perceived moderate disability.   LEFS: 63 out of 80.    COGNITION: Overall cognitive status: Within functional limits for tasks assessed     SENSATION: WFL  MUSCLE LENGTH: Hamstrings: Right 69 deg; Left 72 deg Thomas test: NT  POSTURE: rounded shoulders  PALPATION: Moderate lumbar hypomobility with central/ unilateral mobs and palpation along paraspinals.    LUMBAR ROM:   AROM eval  Flexion WFL (slow to return to upright posture).   Extension WFL  Right lateral flexion WFL (discomfort)  Left lateral flexion WFL  Right rotation WFL  Left rotation WFL   (Blank rows = not tested)  LOWER EXTREMITY ROM:     B LE AROM WFL.    LOWER EXTREMITY MMT:    MMT Right eval Left eval  Hip flexion 4 4  Hip extension    Hip abduction 4+ 4+  Hip adduction    Hip internal rotation 5 5  Hip external rotation 5 5  Knee flexion 5 5  Knee extension 5 5  Ankle dorsiflexion 5 5  Ankle plantarflexion    Ankle inversion    Ankle eversion     (Blank rows = not tested)  LUMBAR SPECIAL TESTS:  Straight leg raise test: Negative and FABER test: Negative  FUNCTIONAL TESTS:  TBD  GAIT: Distance walked: in clinic Assistive device utilized: None Level of assistance: Complete Independence Comments: normalized gait pattern.    TREATMENT DATE: 08/21/24                                                                                                                               See evaluation/ HEP  PATIENT EDUCATION:  Education details: HEP/ lumbar extension and upright posture Person educated: Patient Education method: Explanation, Demonstration, and Handouts Education comprehension: verbalized  understanding and returned demonstration  HOME EXERCISE PROGRAM: Access Code: [facility] URL: [email or web address] Date:  08/21/2024 Prepared by: Ozell Sero  Exercises - Supine Lower Trunk Rotation  - 2 x daily - 7 x weekly - 2 sets - 5 reps - Standing Hamstring Stretch with Step  - 2 x daily - 7 x weekly - 1 sets - 3 reps - 20-30 seconds hold - Supine Bridge  - 1 x daily - 7 x weekly - 2 sets - 10 reps - Standing Lumbar Extension  - 2 x daily - 7 x weekly - 1 sets - 10 reps  ASSESSMENT:  CLINICAL IMPRESSION: Patient is a pleasant 69 y.o. male who was seen today for physical therapy evaluation and treatment for chronic low back pain with R radicular symptoms to R upper thigh.  Pt. Presents with good lumbar AROM with increase low back discomfort/ hypomobility with return from flexion to upright posture.  Slight increase in R LE radicular symptoms with R lateral lumbar flexion to end-range.  B LE muscle strength grossly 5/5 MMT except hip flexion 4/5 MMT and hip abduction 4+/5 MMT.  Pt. Will benefit from skilled PT services to develop exercise program to manage pain symptoms and mobility with work-related/ household tasks.    OBJECTIVE IMPAIRMENTS: decreased activity tolerance, decreased mobility, decreased ROM, decreased strength, hypomobility, impaired flexibility, improper body mechanics, postural dysfunction, and pain.   ACTIVITY LIMITATIONS: carrying, lifting, bending, and standing  PARTICIPATION LIMITATIONS: driving, community activity, and occupation  PERSONAL FACTORS: Fitness and Past/current experiences are also affecting patient's functional outcome.   REHAB POTENTIAL: Good  CLINICAL DECISION MAKING: Stable/uncomplicated  EVALUATION COMPLEXITY: Low   GOALS: Goals reviewed with patient? Yes  SHORT TERM GOALS: Target date: 09/04/24  Pt. Independent with HEP to increase B hip strength 1/2 MMT to improve standing/ pain-free mobility with climbing into truck.  Baseline:  see above Goal status: INITIAL  2.  Pt. Will decrease MODI to <20% to improve pain-free mobility.   Baseline: 30%  self-perceived moderate disability.  Goal status: INITIAL  LONG TERM GOALS: Target date: 09/18/24  Pt. Will increase LEFS to >70 out of 80 to improve functional mobility.   Baseline: 63 out of 80 Goal status: INITIAL  2.  Pt. Able to climb into truck with no c/o back pain or LE radicular symptoms to improve mobility.   Baseline:  pain limited Goal status: INITIAL  PLAN:  PT FREQUENCY: 1x/week  PT DURATION: 4 weeks  PLANNED INTERVENTIONS: 97110-Therapeutic exercises, 97530- Therapeutic activity, [id number]- Neuromuscular re-education, 97535- Self Care, [id number]- Manual therapy, Patient/Family education, Joint mobilization, Spinal mobilization, Cryotherapy, and Moist heat.  PLAN FOR NEXT SESSION: Pt. Will contact PT in 2 weeks to check status.    Ozell JAYSON Sero, PT, DPT # [phone] 08/21/2024, 9:44 AM  "

## 2024-08-21 NOTE — Therapy (Incomplete Revision)
 " OUTPATIENT PHYSICAL THERAPY LOWER EXTREMITY/ BACK EVALUATION  Patient Name: Duane Grant MRN: [id number] DOB:April 24, 1956, 69 y.o., male Today's Date: 08/21/2024  END OF SESSION:  PT End of Session - 08/21/24 0943     Visit Number 1 of 4   Recert date: 09/18/24   PT Start/End Time 0935 to 1028   53 minutes         Past Medical History:  Diagnosis Date   Arthritis    Cancer [facility]) 2009   Prostate, radiation but no surgery   Diabetes mellitus without complication (HCC)    Elevated PSA    H/O removal of cyst 12/02/2019   back    History of prostate cancer    Hyperlipidemia    Hypertension    Past Surgical History:  Procedure Laterality Date   COLONOSCOPY WITH PROPOFOL  N/A 01/16/2023   Procedure: COLONOSCOPY WITH PROPOFOL ;  Surgeon: Unk Corinn Skiff, MD;  Location: [facility] ENDOSCOPY;  Service: Gastroenterology;  Laterality: N/A;   HERNIA REPAIR  2010   Umbilical   HERNIA REPAIR  2010   JOINT REPLACEMENT     Removal Fatty Tumor Right 2015   [facility], right side of chin/jaw   TOOTH EXTRACTION  12/15/2020   TOTAL HIP ARTHROPLASTY Left 06/09/2016   Procedure: TOTAL HIP ARTHROPLASTY;  Surgeon: Franky Cranker, MD;  Location: [facility] ORS;  Service: Orthopedics;  Laterality: Left;   Patient Active Problem List   Diagnosis Date Noted   Positive colorectal cancer screening using Cologuard test 01/16/2023   Cecal polyp 01/16/2023   Adenomatous polyp of colon 01/16/2023   Encounter for Department of Transportation (DOT) examination for trucking license [id number]/87/7977   Hearing loss, left 06/10/2020   Drug-induced myopathy 05/11/2020   Chronic bilateral low back pain without sciatica 12/23/2019   Hyperlipidemia associated with type 2 diabetes mellitus (HCC) 11/11/2019   Recurrent HSV (herpes simplex virus) 03/11/2019   Arthritis of carpometacarpal (CMC) joint of left thumb 03/11/2019   Obesity (BMI 30.0-34.9) 03/11/2019   Status post total hip replacement, left 06/09/2016   Hip  pain 05/18/2016   Type 2 diabetes mellitus with other specified complication (HCC) 02/06/2014   Prostate cancer (HCC) 02/06/2014   Parotid mass 12/23/2013   Essential hypertension 12/06/2013   Herpesviral infection of urogenital system 12/06/2013   Erectile dysfunction 12/06/2013   Neck mass 12/06/2013    PCP: Edman Marsa PARAS, DO  REFERRING PROVIDER: Dow Frederic BIRCH, MD  REFERRING DIAG: Diagnosis  M54.16 (ICD-10-CM) - Radiculopathy, lumbar region    THERAPY DIAG:  Other low back pain  Joint stiffness of spine  Muscle weakness (generalized)  Rationale for Evaluation and Treatment: Rehabilitation  ONSET DATE: Chronic (recent exacerbation)   SUBJECTIVE:  SUBJECTIVE STATEMENT: ***  PERTINENT HISTORY:  ***  PAIN:  Are you having pain? {OPRCPAIN:27236}  PRECAUTIONS: {Therapy precautions:24002}  RED FLAGS: {PT Red Flags:29287}   WEIGHT BEARING RESTRICTIONS: {Yes ***/No:24003}  FALLS:  Has patient fallen in last 6 months? {fallsyesno:27318}  LIVING ENVIRONMENT: Lives with: {OPRC lives with:25569::lives with their family} Lives in: {Lives in:25570} Stairs: {opstairs:27293} Has following equipment at home: {Assistive devices:23999}  OCCUPATION: ***  PLOF: {PLOF:24004}  PATIENT GOALS: ***  NEXT MD VISIT: ***  OBJECTIVE:  Note: Objective measures were completed at Evaluation unless otherwise noted.  DIAGNOSTIC FINDINGS:  ***  PATIENT SURVEYS:  {rehab surveys:24030}  COGNITION: Overall cognitive status: {cognition:24006}     SENSATION: {sensation:27233}  MUSCLE LENGTH: Hamstrings: Right *** deg; Left *** deg Debby test: Right *** deg; Left *** deg  POSTURE: {posture:25561}  PALPATION: ***  LUMBAR ROM:   AROM eval  Flexion   Extension   Right  lateral flexion   Left lateral flexion   Right rotation   Left rotation    (Blank rows = not tested)  LOWER EXTREMITY ROM:     {AROM/PROM:27142}  Right eval Left eval  Hip flexion    Hip extension    Hip abduction    Hip adduction    Hip internal rotation    Hip external rotation    Knee flexion    Knee extension    Ankle dorsiflexion    Ankle plantarflexion    Ankle inversion    Ankle eversion     (Blank rows = not tested)  LOWER EXTREMITY MMT:    MMT Right eval Left eval  Hip flexion    Hip extension    Hip abduction    Hip adduction    Hip internal rotation    Hip external rotation    Knee flexion    Knee extension    Ankle dorsiflexion    Ankle plantarflexion    Ankle inversion    Ankle eversion     (Blank rows = not tested)  LUMBAR SPECIAL TESTS:  {lumbar special test:25242}  FUNCTIONAL TESTS:  {Functional tests:24029}  GAIT: Distance walked: *** Assistive device utilized: {Assistive devices:23999} Level of assistance: {Levels of assistance:24026} Comments: ***  TREATMENT DATE: ***                                                                                                                                 PATIENT EDUCATION:  Education details: *** Person educated: {Person educated:25204} Education method: {Education Method:25205} Education comprehension: {Education Comprehension:25206}  HOME EXERCISE PROGRAM: ***  ASSESSMENT:  CLINICAL IMPRESSION: Patient is a *** y.o. *** who was seen today for physical therapy evaluation and treatment for ***.   OBJECTIVE IMPAIRMENTS: {opptimpairments:25111}.   ACTIVITY LIMITATIONS: {activitylimitations:27494}  PARTICIPATION LIMITATIONS: {participationrestrictions:25113}  PERSONAL FACTORS: {Personal factors:25162} are also affecting patient's functional outcome.   REHAB POTENTIAL: {rehabpotential:25112}  CLINICAL DECISION MAKING: {clinical decision making:25114}  EVALUATION COMPLEXITY:  {Evaluation complexity:25115}  GOALS: Goals reviewed with patient? {yes/no:20286}  SHORT TERM GOALS: Target date: ***  *** Baseline: Goal status: INITIAL  2.  *** Baseline:  Goal status: INITIAL  3.  *** Baseline:  Goal status: INITIAL  4.  *** Baseline:  Goal status: INITIAL  5.  *** Baseline:  Goal status: INITIAL  6.  *** Baseline:  Goal status: INITIAL  LONG TERM GOALS: Target date: ***  *** Baseline:  Goal status: INITIAL  2.  *** Baseline:  Goal status: INITIAL  3.  *** Baseline:  Goal status: INITIAL  4.  *** Baseline:  Goal status: INITIAL  5.  *** Baseline:  Goal status: INITIAL  6.  *** Baseline:  Goal status: INITIAL  PLAN:  PT FREQUENCY: {rehab frequency:25116}  PT DURATION: {rehab duration:25117}  PLANNED INTERVENTIONS: {rehab planned interventions:25118::97110-Therapeutic exercises,97530- Therapeutic [phone]- Neuromuscular re-education,97535- Self Rjmz,02859- Manual therapy,Patient/Family education}.  PLAN FOR NEXT SESSION: ***  Ozell JAYSON Sero, PT, DPT # [phone] 08/21/2024, 9:44 AM  "

## 2024-09-02 NOTE — Addendum Note (Signed)
 Addended by: ILEENE OZELL BROCKS on: 09/02/2024 08:11 PM   Modules accepted: Orders

## 2024-09-03 ENCOUNTER — Encounter: Payer: Self-pay | Admitting: Physical Therapy
